# Patient Record
Sex: Female | Born: 1945 | Race: White | Hispanic: No | Smoking: Former smoker
Health system: Southern US, Community
[De-identification: ages and names within clinical notes are randomized; demographics above are authoritative.]

## PROBLEM LIST (undated history)

## (undated) DIAGNOSIS — E785 Hyperlipidemia, unspecified: Secondary | ICD-10-CM

## (undated) DIAGNOSIS — F32A Depression, unspecified: Secondary | ICD-10-CM

## (undated) DIAGNOSIS — M8580 Other specified disorders of bone density and structure, unspecified site: Secondary | ICD-10-CM

## (undated) DIAGNOSIS — F329 Major depressive disorder, single episode, unspecified: Secondary | ICD-10-CM

## (undated) DIAGNOSIS — K219 Gastro-esophageal reflux disease without esophagitis: Secondary | ICD-10-CM

## (undated) DIAGNOSIS — I10 Essential (primary) hypertension: Secondary | ICD-10-CM

## (undated) DIAGNOSIS — F419 Anxiety disorder, unspecified: Secondary | ICD-10-CM

## (undated) DIAGNOSIS — H269 Unspecified cataract: Secondary | ICD-10-CM

## (undated) DIAGNOSIS — F1910 Other psychoactive substance abuse, uncomplicated: Secondary | ICD-10-CM

## (undated) HISTORY — PX: TUBAL LIGATION: SHX77

## (undated) HISTORY — PX: EYE SURGERY: SHX253

## (undated) HISTORY — PX: NASAL SEPTUM SURGERY: SHX37

## (undated) HISTORY — DX: Depression, unspecified: F32.A

## (undated) HISTORY — DX: Other psychoactive substance abuse, uncomplicated: F19.10

## (undated) HISTORY — DX: Hyperlipidemia, unspecified: E78.5

## (undated) HISTORY — DX: Gastro-esophageal reflux disease without esophagitis: K21.9

## (undated) HISTORY — DX: Anxiety disorder, unspecified: F41.9

## (undated) HISTORY — DX: Essential (primary) hypertension: I10

## (undated) HISTORY — DX: Other specified disorders of bone density and structure, unspecified site: M85.80

## (undated) HISTORY — DX: Major depressive disorder, single episode, unspecified: F32.9

## (undated) HISTORY — DX: Unspecified cataract: H26.9

---

## 2011-11-01 DIAGNOSIS — N3946 Mixed incontinence: Secondary | ICD-10-CM | POA: Insufficient documentation

## 2011-11-01 DIAGNOSIS — M1990 Unspecified osteoarthritis, unspecified site: Secondary | ICD-10-CM | POA: Insufficient documentation

## 2011-11-01 DIAGNOSIS — M810 Age-related osteoporosis without current pathological fracture: Secondary | ICD-10-CM | POA: Insufficient documentation

## 2011-11-01 DIAGNOSIS — E785 Hyperlipidemia, unspecified: Secondary | ICD-10-CM | POA: Insufficient documentation

## 2011-11-01 DIAGNOSIS — I10 Essential (primary) hypertension: Secondary | ICD-10-CM | POA: Insufficient documentation

## 2011-11-01 DIAGNOSIS — E559 Vitamin D deficiency, unspecified: Secondary | ICD-10-CM | POA: Insufficient documentation

## 2011-11-01 DIAGNOSIS — J309 Allergic rhinitis, unspecified: Secondary | ICD-10-CM | POA: Insufficient documentation

## 2011-11-01 DIAGNOSIS — H3530 Unspecified macular degeneration: Secondary | ICD-10-CM | POA: Insufficient documentation

## 2011-11-08 DIAGNOSIS — F419 Anxiety disorder, unspecified: Secondary | ICD-10-CM | POA: Insufficient documentation

## 2013-07-25 LAB — HM COLONOSCOPY

## 2014-01-31 DIAGNOSIS — E042 Nontoxic multinodular goiter: Secondary | ICD-10-CM | POA: Insufficient documentation

## 2014-05-21 DIAGNOSIS — Z0000 Encounter for general adult medical examination without abnormal findings: Secondary | ICD-10-CM | POA: Insufficient documentation

## 2014-12-17 ENCOUNTER — Telehealth: Payer: Self-pay | Admitting: Family Medicine

## 2014-12-17 NOTE — Telephone Encounter (Signed)
Patient has [employer] and medicare. Patient is on simvastatin 40mg , equate vision formula, stool softner, vit d 3, asa, hctz 25mg , lexapro 10, biotin, fish oil, omeprazole 20mg , ambien 10mg . Appointment given for 8/10 @ 1:10pm with Stacks.

## 2014-12-26 DIAGNOSIS — G4700 Insomnia, unspecified: Secondary | ICD-10-CM | POA: Insufficient documentation

## 2015-01-14 ENCOUNTER — Encounter (INDEPENDENT_AMBULATORY_CARE_PROVIDER_SITE_OTHER): Payer: Self-pay

## 2015-01-14 ENCOUNTER — Other Ambulatory Visit: Payer: Self-pay | Admitting: *Deleted

## 2015-01-14 ENCOUNTER — Ambulatory Visit (INDEPENDENT_AMBULATORY_CARE_PROVIDER_SITE_OTHER): Payer: Medicare Other | Admitting: Family Medicine

## 2015-01-14 ENCOUNTER — Encounter: Payer: Self-pay | Admitting: Family Medicine

## 2015-01-14 VITALS — BP 126/70 | HR 56 | Temp 97.3°F | Ht 59.0 in | Wt 105.8 lb

## 2015-01-14 DIAGNOSIS — E559 Vitamin D deficiency, unspecified: Secondary | ICD-10-CM

## 2015-01-14 DIAGNOSIS — E042 Nontoxic multinodular goiter: Secondary | ICD-10-CM | POA: Diagnosis not present

## 2015-01-14 DIAGNOSIS — G4700 Insomnia, unspecified: Secondary | ICD-10-CM | POA: Diagnosis not present

## 2015-01-14 DIAGNOSIS — K2100 Gastro-esophageal reflux disease with esophagitis, without bleeding: Secondary | ICD-10-CM

## 2015-01-14 DIAGNOSIS — I10 Essential (primary) hypertension: Secondary | ICD-10-CM | POA: Diagnosis not present

## 2015-01-14 DIAGNOSIS — M8580 Other specified disorders of bone density and structure, unspecified site: Secondary | ICD-10-CM

## 2015-01-14 DIAGNOSIS — F419 Anxiety disorder, unspecified: Secondary | ICD-10-CM | POA: Diagnosis not present

## 2015-01-14 DIAGNOSIS — M1990 Unspecified osteoarthritis, unspecified site: Secondary | ICD-10-CM | POA: Diagnosis not present

## 2015-01-14 DIAGNOSIS — E785 Hyperlipidemia, unspecified: Secondary | ICD-10-CM

## 2015-01-14 MED ORDER — ZOLPIDEM TARTRATE 10 MG PO TABS: 10.0000 mg | ORAL_TABLET | Freq: Every evening | ORAL | Status: DC | PRN

## 2015-01-14 MED ORDER — DENOSUMAB 60 MG/ML ~~LOC~~ SOLN: 60.0000 mg | Freq: Once | SUBCUTANEOUS | Status: DC

## 2015-01-14 MED ORDER — ESCITALOPRAM OXALATE 10 MG PO TABS: 10.0000 mg | ORAL_TABLET | Freq: Every day | ORAL | Status: DC

## 2015-01-14 MED ORDER — OMEPRAZOLE 20 MG PO CPDR: 20.0000 mg | DELAYED_RELEASE_CAPSULE | Freq: Every day | ORAL | Status: DC

## 2015-01-14 NOTE — Progress Notes (Signed)
Subjective:  Patient ID: Karina Clark, female    DOB: [birth year]  Age: 69 y.o. MRN: [id number]  CC: Establish Care   HPI Isaac Lacson presents for Patient in for follow-up of GERD. Currently asymptomatic taking  PPI daily. There is no chest pain or heartburn. No hematemesis and no melena. No dysphagia or choking. Onset is remote. Progression is stable. Complicating factors, none. Patient relates that she had to have her esophagus dilated in the past by Dr. Arlana Pouch in [place]. She does well as long as she takes her omeprazole. Patient also takes Ambien for insomnia. She sleeps well as long as she takes the Ambien she estimates that she's been using that for 3 years on a regular basis  Patient in for follow-up of elevated cholesterol. Doing well without complaints on current medication. Denies side effects of statin including myalgia and arthralgia and nausea. Also in today for liver function testing. Currently no chest pain, shortness of breath or other cardiovascular related symptoms noted. She has been followed for anxiety. She just tends to worry a lot and that seems to paralyze her. She as an ominous feeling that bad things are about to happen at any given time.this has been relieved well for her using  escitalopram in the past she would like to have that renewed. She denies any side effect.  She has been treated or osteoporosis/osteopenia in the past and would like to resume treatment. She does not tolerate oral medicines for this due to side effects. She is interested in injection.o recent fracture history. No spinal or hip fractures.  follow-up of hypertension. Patient has no history of headache chest pain or shortness of breath or recent cough. Patient also denies symptoms of TIA such as numbness weakness lateralizing. Patient checks  blood pressure at home and has not had any elevated readings recently. Patient denies side effects from his medication. States taking it  regularly.   History Jakya has a past medical history of Hyperlipidemia; Hypertension; and Anxiety.   She has past surgical history that includes Tubal ligation.   Her family history includes Hyperlipidemia in her mother.She reports that she quit smoking about 19 years ago. Her smoking use included Cigarettes. She started smoking about 45 years ago. She smoked 0.50 packs per day. She does not have any smokeless tobacco history on file. She reports that she does not drink alcohol or use illicit drugs.  Med list attached, gleaned from care everywhere, reviewed with pt and added to chart.Labs from Care everywhere reviewed as well.  ROS Review of Systems  Constitutional: Negative for fever, chills, diaphoresis, appetite change, fatigue and unexpected weight change.  HENT: Negative for congestion, ear pain, hearing loss, postnasal drip, rhinorrhea, sneezing, sore throat and trouble swallowing.   Eyes: Negative for pain.  Respiratory: Negative for cough, chest tightness and shortness of breath.   Cardiovascular: Negative for chest pain and palpitations.  Gastrointestinal: Negative for nausea, vomiting, abdominal pain, diarrhea and constipation.  Genitourinary: Negative for dysuria, frequency and menstrual problem.  Musculoskeletal: Negative for joint swelling and arthralgias.  Skin: Negative for rash.  Neurological: Negative for dizziness, weakness, numbness and headaches.  Psychiatric/Behavioral: Negative for dysphoric mood and agitation.    Objective:  BP 126/70 mmHg  Pulse 56  Temp(Src) 97.3 F (36.3 C) (Oral)  Ht 4\' 11"  (1.499 m)  Wt 105 lb 12.8 oz (47.991 kg)  BMI 21.36 kg/m2  BP Readings from Last 3 Encounters:  01/14/15 126/70    Wt Readings from  Last 3 Encounters:  01/14/15 105 lb 12.8 oz (47.991 kg)     Physical Exam  Constitutional: She is oriented to person, place, and time. She appears well-developed and well-nourished. No distress.  HENT:  Head: Normocephalic and  atraumatic.  Right Ear: External ear normal.  Left Ear: External ear normal.  Nose: Nose normal.  Mouth/Throat: Oropharynx is clear and moist.  Eyes: Conjunctivae and EOM are normal. Pupils are equal, round, and reactive to light.  Neck: Normal range of motion. Neck supple. No thyromegaly present.  Cardiovascular: Normal rate, regular rhythm and normal heart sounds.   No murmur heard. Pulmonary/Chest: Effort normal and breath sounds normal. No respiratory distress. She has no wheezes. She has no rales.  Abdominal: Soft. Bowel sounds are normal. She exhibits no distension. There is no tenderness.  Lymphadenopathy:    She has no cervical adenopathy.  Neurological: She is alert and oriented to person, place, and time. She has normal reflexes.  Skin: Skin is warm and dry.  Psychiatric: She has a normal mood and affect. Her behavior is normal. Judgment and thought content normal.    No results found for: HGBA1C  No results found for: WBC, HGB, HCT, PLT, GLUCOSE, CHOL, TRIG, HDL, LDLDIRECT, LDLCALC, ALT, AST, NA, K, CL, CREATININE, BUN, CO2, TSH, PSA, INR, GLUF, HGBA1C, MICROALBUR  Patient was never admitted.  Assessment & Plan:   Tequlia was seen today for establish care.  Diagnoses and all orders for this visit:  Gastroesophageal reflux disease with esophagitis  Anxiety  HLD (hyperlipidemia)  Essential hypertension  Cannot sleep  Multinodular goiter  Osteoarthritis, unspecified osteoarthritis type, unspecified site  Osteopenia  Avitaminosis D  Other orders -     omeprazole (PRILOSEC) 20 MG capsule; Take 1 capsule (20 mg total) by mouth daily. -     zolpidem (AMBIEN) 10 MG tablet; Take 1 tablet (10 mg total) by mouth at bedtime as needed. -     denosumab (PROLIA) 60 MG/ML SOLN injection; Inject 60 mg into the skin once. Administer in upper arm, thigh, or abdomen  I have changed Ms. Laramee's omeprazole and zolpidem. I am also having her start on denosumab. Additionally, I  am having her maintain her aspirin EC, Biotin, hydrochlorothiazide, Vitamin D (Cholecalciferol), docusate sodium, Omega-3, and simvastatin.  Meds ordered this encounter  Medications  . aspirin EC 81 MG tablet    Sig: Take 81 mg by mouth daily.  . Biotin 1 MG CAPS    Sig: Take 1 tablet by mouth daily.  Marland Kitchen DISCONTD: zolpidem (AMBIEN) 10 MG tablet    Sig: Take 10 mg by mouth at bedtime as needed.  . hydrochlorothiazide (HYDRODIURIL) 25 MG tablet    Sig: Take 25 mg by mouth daily.   . Vitamin D, Cholecalciferol, 1000 UNITS CAPS    Sig: Take 1,000 Units by mouth daily.  Marland Kitchen docusate sodium (COLACE) 100 MG capsule    Sig: Take 100 mg by mouth.  . Omega-3 1000 MG CAPS    Sig: Take 1 g by mouth daily.  . simvastatin (ZOCOR) 40 MG tablet    Sig: Take 40 mg by mouth daily.   Marland Kitchen DISCONTD: omeprazole (PRILOSEC) 20 MG capsule    Sig: TAKE ONE CAPSULE DAILY.  Marland Kitchen DISCONTD: escitalopram (LEXAPRO) 10 MG tablet    Sig: Take 10 mg by mouth daily.   Marland Kitchen omeprazole (PRILOSEC) 20 MG capsule    Sig: Take 1 capsule (20 mg total) by mouth daily.    Dispense:  90  capsule    Refill:  3  . zolpidem (AMBIEN) 10 MG tablet    Sig: Take 1 tablet (10 mg total) by mouth at bedtime as needed.    Dispense:  90 tablet    Refill:  1  . denosumab (PROLIA) 60 MG/ML SOLN injection    Sig: Inject 60 mg into the skin once. Administer in upper arm, thigh, or abdomen    Dispense:  1 mL    Refill:  1     Follow-up: Return in about 3 months (around 04/16/2015).  Claretta Fraise, M.D.

## 2015-01-14 NOTE — Patient Instructions (Signed)
Cut back on the Lexapro as follows. Over the next 6 weeks please take just one half of a tablet daily at your usual time. Monitor yourself for signs of returning or worsening anxiety. This would include worry that is difficult for you to control. Feeling tremorous or physical symptoms such as chest pain or shortness of breath without another physical cause. If these do not occur discontinue the Lexapro after 6 weeks. Monitor for the same anxiety symptoms during the following 6 weeks. At the end of that time he will be due your next checkup and weaned can discuss the procedure at that time whether to continue off of the medicine or to resume it at either the lower or full dose

## 2015-01-19 ENCOUNTER — Ambulatory Visit: Payer: Self-pay

## 2015-02-02 DIAGNOSIS — Z1231 Encounter for screening mammogram for malignant neoplasm of breast: Secondary | ICD-10-CM | POA: Diagnosis not present

## 2015-02-16 ENCOUNTER — Encounter: Payer: Self-pay | Admitting: Family Medicine

## 2015-02-19 DIAGNOSIS — M5033 Other cervical disc degeneration, cervicothoracic region: Secondary | ICD-10-CM | POA: Diagnosis not present

## 2015-02-19 DIAGNOSIS — M9901 Segmental and somatic dysfunction of cervical region: Secondary | ICD-10-CM | POA: Diagnosis not present

## 2015-02-24 DIAGNOSIS — M5033 Other cervical disc degeneration, cervicothoracic region: Secondary | ICD-10-CM | POA: Diagnosis not present

## 2015-02-24 DIAGNOSIS — M9901 Segmental and somatic dysfunction of cervical region: Secondary | ICD-10-CM | POA: Diagnosis not present

## 2015-02-25 DIAGNOSIS — M9901 Segmental and somatic dysfunction of cervical region: Secondary | ICD-10-CM | POA: Diagnosis not present

## 2015-02-25 DIAGNOSIS — M5033 Other cervical disc degeneration, cervicothoracic region: Secondary | ICD-10-CM | POA: Diagnosis not present

## 2015-02-26 DIAGNOSIS — M5033 Other cervical disc degeneration, cervicothoracic region: Secondary | ICD-10-CM | POA: Diagnosis not present

## 2015-02-26 DIAGNOSIS — M9901 Segmental and somatic dysfunction of cervical region: Secondary | ICD-10-CM | POA: Diagnosis not present

## 2015-03-02 DIAGNOSIS — M5033 Other cervical disc degeneration, cervicothoracic region: Secondary | ICD-10-CM | POA: Diagnosis not present

## 2015-03-02 DIAGNOSIS — M9901 Segmental and somatic dysfunction of cervical region: Secondary | ICD-10-CM | POA: Diagnosis not present

## 2015-03-04 DIAGNOSIS — M9901 Segmental and somatic dysfunction of cervical region: Secondary | ICD-10-CM | POA: Diagnosis not present

## 2015-03-04 DIAGNOSIS — M5033 Other cervical disc degeneration, cervicothoracic region: Secondary | ICD-10-CM | POA: Diagnosis not present

## 2015-03-05 ENCOUNTER — Ambulatory Visit (INDEPENDENT_AMBULATORY_CARE_PROVIDER_SITE_OTHER): Payer: Medicare Other

## 2015-03-05 DIAGNOSIS — Z23 Encounter for immunization: Secondary | ICD-10-CM

## 2015-03-05 DIAGNOSIS — M9901 Segmental and somatic dysfunction of cervical region: Secondary | ICD-10-CM | POA: Diagnosis not present

## 2015-03-05 DIAGNOSIS — M5033 Other cervical disc degeneration, cervicothoracic region: Secondary | ICD-10-CM | POA: Diagnosis not present

## 2015-03-09 DIAGNOSIS — M5033 Other cervical disc degeneration, cervicothoracic region: Secondary | ICD-10-CM | POA: Diagnosis not present

## 2015-03-09 DIAGNOSIS — M9901 Segmental and somatic dysfunction of cervical region: Secondary | ICD-10-CM | POA: Diagnosis not present

## 2015-03-11 DIAGNOSIS — M5033 Other cervical disc degeneration, cervicothoracic region: Secondary | ICD-10-CM | POA: Diagnosis not present

## 2015-03-11 DIAGNOSIS — M9901 Segmental and somatic dysfunction of cervical region: Secondary | ICD-10-CM | POA: Diagnosis not present

## 2015-03-12 DIAGNOSIS — M5033 Other cervical disc degeneration, cervicothoracic region: Secondary | ICD-10-CM | POA: Diagnosis not present

## 2015-03-12 DIAGNOSIS — M9901 Segmental and somatic dysfunction of cervical region: Secondary | ICD-10-CM | POA: Diagnosis not present

## 2015-03-16 DIAGNOSIS — H2513 Age-related nuclear cataract, bilateral: Secondary | ICD-10-CM | POA: Diagnosis not present

## 2015-03-16 DIAGNOSIS — H40033 Anatomical narrow angle, bilateral: Secondary | ICD-10-CM | POA: Diagnosis not present

## 2015-03-18 ENCOUNTER — Encounter: Payer: Self-pay | Admitting: Family Medicine

## 2015-03-18 ENCOUNTER — Ambulatory Visit (INDEPENDENT_AMBULATORY_CARE_PROVIDER_SITE_OTHER): Payer: Medicare Other | Admitting: Family Medicine

## 2015-03-18 VITALS — BP 150/86 | HR 67 | Temp 97.1°F | Ht 59.0 in | Wt 105.2 lb

## 2015-03-18 DIAGNOSIS — F329 Major depressive disorder, single episode, unspecified: Secondary | ICD-10-CM | POA: Diagnosis not present

## 2015-03-18 DIAGNOSIS — F32A Depression, unspecified: Secondary | ICD-10-CM

## 2015-03-18 DIAGNOSIS — I10 Essential (primary) hypertension: Secondary | ICD-10-CM

## 2015-03-18 DIAGNOSIS — F419 Anxiety disorder, unspecified: Secondary | ICD-10-CM

## 2015-03-18 DIAGNOSIS — R110 Nausea: Secondary | ICD-10-CM | POA: Diagnosis not present

## 2015-03-18 LAB — POCT URINALYSIS DIPSTICK
Glucose, UA: NEGATIVE
Ketones, UA: NEGATIVE
Leukocytes, UA: NEGATIVE
NITRITE UA: NEGATIVE
Protein, UA: NEGATIVE
Spec Grav, UA: 1.02
UROBILINOGEN UA: NEGATIVE
pH, UA: 6

## 2015-03-18 LAB — POCT UA - MICROSCOPIC ONLY
BACTERIA, U MICROSCOPIC: NEGATIVE
CASTS, UR, LPF, POC: NEGATIVE
CRYSTALS, UR, HPF, POC: NEGATIVE
MUCUS UA: NEGATIVE
WBC, Ur, HPF, POC: NEGATIVE
Yeast, UA: NEGATIVE

## 2015-03-18 MED ORDER — ESCITALOPRAM OXALATE 5 MG PO TABS: 5.0000 mg | ORAL_TABLET | Freq: Every day | ORAL | Status: DC

## 2015-03-18 NOTE — Progress Notes (Signed)
Subjective:  Patient ID: Karina Clark, female    DOB: 09-20-1945  Age: 69 y.o. MRN: [id number]  CC: anxiety   HPI Karina Clark presents for anxiety and depression. Tapered off lexapro as we discussed. Felt good off med for first week. Now achy in the joints. Feverish. Had flu shot on the 29th and sx started the next day. Felt like she was coming out of her skin yesterday. Weepy yesterday. My past eats me up sometimes. Bad marriages.  Had a suicide attempt many years ago when first husband left. 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 History Karina Clark has a past medical history of Hyperlipidemia; Hypertension; Anxiety; Substance abuse; and Cataract.   She has past surgical history that includes Tubal ligation.   Her family history includes Hyperlipidemia in her mother.She reports that she quit smoking about 20 years ago. Her smoking use included Cigarettes. She started smoking about 46 years ago. She smoked 0.50 packs per day. She does not have any smokeless tobacco history on file. She reports that she does not drink alcohol or use illicit drugs.  Outpatient Prescriptions Prior to Visit  Medication Sig Dispense Refill  . aspirin EC 81 MG tablet Take 81 mg by mouth daily.    . Biotin 1 MG CAPS Take 1 tablet by mouth daily.    Marland Kitchen denosumab (PROLIA) 60 MG/ML SOLN injection Inject 60 mg into the skin once. Administer in upper arm, thigh, or abdomen 1 mL 1  . docusate sodium (COLACE) 100 MG capsule Take 100 mg by mouth.    . hydrochlorothiazide (HYDRODIURIL) 25 MG tablet Take 25 mg by mouth daily.     . Omega-3 1000 MG CAPS Take 1 g by mouth daily.    Marland Kitchen omeprazole (PRILOSEC) 20 MG capsule Take 1 capsule (20 mg total) by mouth daily. 90 capsule 3  . simvastatin (ZOCOR) 40 MG tablet Take 40 mg by mouth daily.     . Vitamin D, Cholecalciferol, 1000 UNITS CAPS Take 1,000 Units by mouth daily.    Marland Kitchen zolpidem (AMBIEN) 10 MG tablet Take 1 tablet (10 mg total) by mouth at  bedtime as needed. 90 tablet 1  . escitalopram (LEXAPRO) 10 MG tablet Take 1 tablet (10 mg total) by mouth daily. (Patient not taking: Reported on 03/18/2015) 90 tablet 0   No facility-administered medications prior to visit.    ROS Review of Systems  Constitutional: Negative for fever, activity change and appetite change.  HENT: Negative for congestion, rhinorrhea and sore throat.   Eyes: Negative for pain and visual disturbance.  Respiratory: Negative for cough and shortness of breath.   Gastrointestinal: Negative for nausea and abdominal pain.  Musculoskeletal: Negative for myalgias and arthralgias.  Psychiatric/Behavioral: Positive for behavioral problems, dysphoric mood and agitation. The patient is nervous/anxious.     Objective:  BP 150/86 mmHg  Pulse 67  Temp(Src) 97.1 F (36.2 C) (Oral)  Ht 4\' 11"  (1.499 m)  Wt 105 lb 3.2 oz (47.718 kg)  BMI 21.24 kg/m2  BP Readings from Last 3 Encounters:  03/18/15 150/86  01/14/15 126/70    Wt Readings from Last 3 Encounters:  03/18/15 105 lb 3.2 oz (47.718 kg)  01/14/15 105 lb 12.8 oz (47.991 kg)     Physical Exam  Constitutional: She is oriented to person, place, and time. She appears well-developed and well-nourished. She appears distressed.  HENT:  Head: Normocephalic and atraumatic.  Eyes: Conjunctivae are normal. Pupils are equal, round,  and reactive to light.  Neck: Normal range of motion. Neck supple. No thyromegaly present.  Cardiovascular: Normal rate, regular rhythm and normal heart sounds.   No murmur heard. Pulmonary/Chest: Effort normal and breath sounds normal. No respiratory distress. She has no wheezes. She has no rales.  Abdominal: Soft. Bowel sounds are normal. She exhibits no distension. There is no tenderness.  Musculoskeletal: Normal range of motion.  Lymphadenopathy:    She has no cervical adenopathy.  Neurological: She is alert and oriented to person, place, and time.  Skin: Skin is warm and dry.    Psychiatric: Her behavior is normal. Judgment and thought content normal.  Upset, agitated. Tearful    No results found for: HGBA1C  No results found for: WBC, HGB, HCT, PLT, GLUCOSE, CHOL, TRIG, HDL, LDLDIRECT, LDLCALC, ALT, AST, NA, K, CL, CREATININE, BUN, CO2, TSH, PSA, INR, GLUF, HGBA1C, MICROALBUR  Patient was never admitted.  Assessment & Plan:   Karina Clark was seen today for anxiety.  Diagnoses and all orders for this visit:  Nausea without vomiting -     POCT UA - Microscopic Only -     POCT urinalysis dipstick  Essential hypertension  Anxiety  Depression  Other orders -     escitalopram (LEXAPRO) 5 MG tablet; Take 1 tablet (5 mg total) by mouth daily.   I have changed Karina Clark's escitalopram. I am also having her maintain her aspirin EC, Biotin, hydrochlorothiazide, Vitamin D (Cholecalciferol), docusate sodium, Omega-3, simvastatin, omeprazole, zolpidem, and denosumab.  Meds ordered this encounter  Medications  . escitalopram (LEXAPRO) 5 MG tablet    Sig: Take 1 tablet (5 mg total) by mouth daily.    Dispense:  30 tablet    Refill:  5     Follow-up: Return in about 1 month (around 04/18/2015).  Claretta Fraise, M.D.

## 2015-03-19 ENCOUNTER — Encounter: Payer: Self-pay | Admitting: Family Medicine

## 2015-03-27 ENCOUNTER — Telehealth: Payer: Self-pay | Admitting: Family Medicine

## 2015-03-27 NOTE — Telephone Encounter (Signed)
Patient states that she is still not feeling good and she wants to see about going back on the 10mg  of lexapro. She states her anxiety and depression is not improving.

## 2015-03-27 NOTE — Telephone Encounter (Signed)
That is fine. As long as she is not having any side effects.

## 2015-03-27 NOTE — Telephone Encounter (Signed)
Stp and she has an appt with Dr.Stacks 11/11 and will talk with him at that time.

## 2015-04-03 ENCOUNTER — Other Ambulatory Visit: Payer: Self-pay | Admitting: Pharmacist

## 2015-04-03 ENCOUNTER — Telehealth: Payer: Self-pay | Admitting: Pharmacist

## 2015-04-03 DIAGNOSIS — M8580 Other specified disorders of bone density and structure, unspecified site: Secondary | ICD-10-CM

## 2015-04-03 NOTE — Telephone Encounter (Signed)
I spoke to patient about Prolia coverage.  Verified through [employer] that Prolia would be covered for diagnosis M85.80.   Patient is due to have DEXA at end of November. We decided to hold off on Prolia until after DEXA done.  Order sent for DEXA

## 2015-04-13 DIAGNOSIS — L570 Actinic keratosis: Secondary | ICD-10-CM | POA: Diagnosis not present

## 2015-04-13 DIAGNOSIS — D485 Neoplasm of uncertain behavior of skin: Secondary | ICD-10-CM | POA: Diagnosis not present

## 2015-04-13 DIAGNOSIS — L851 Acquired keratosis [keratoderma] palmaris et plantaris: Secondary | ICD-10-CM | POA: Diagnosis not present

## 2015-04-13 DIAGNOSIS — L280 Lichen simplex chronicus: Secondary | ICD-10-CM | POA: Diagnosis not present

## 2015-04-17 ENCOUNTER — Encounter: Payer: Self-pay | Admitting: Family Medicine

## 2015-04-17 ENCOUNTER — Ambulatory Visit (INDEPENDENT_AMBULATORY_CARE_PROVIDER_SITE_OTHER): Payer: Medicare Other | Admitting: Family Medicine

## 2015-04-17 VITALS — BP 115/63 | HR 70 | Temp 98.1°F | Ht 59.0 in | Wt 105.2 lb

## 2015-04-17 DIAGNOSIS — F419 Anxiety disorder, unspecified: Secondary | ICD-10-CM

## 2015-04-17 DIAGNOSIS — E785 Hyperlipidemia, unspecified: Secondary | ICD-10-CM | POA: Diagnosis not present

## 2015-04-17 DIAGNOSIS — K2100 Gastro-esophageal reflux disease with esophagitis, without bleeding: Secondary | ICD-10-CM

## 2015-04-17 DIAGNOSIS — M1990 Unspecified osteoarthritis, unspecified site: Secondary | ICD-10-CM

## 2015-04-17 DIAGNOSIS — I10 Essential (primary) hypertension: Secondary | ICD-10-CM

## 2015-04-17 MED ORDER — ESCITALOPRAM OXALATE 10 MG PO TABS: 10.0000 mg | ORAL_TABLET | Freq: Every day | ORAL | Status: DC

## 2015-04-17 MED ORDER — MIRABEGRON ER 25 MG PO TB24: 25.0000 mg | ORAL_TABLET | Freq: Every day | ORAL | Status: DC

## 2015-04-17 MED ORDER — SIMVASTATIN 40 MG PO TABS: 40.0000 mg | ORAL_TABLET | Freq: Every day | ORAL | Status: DC

## 2015-04-17 MED ORDER — MIRABEGRON ER 25 MG PO TB24
25.0000 mg | ORAL_TABLET | Freq: Every day | ORAL | Status: DC
Start: 2015-04-17 — End: 2015-04-17

## 2015-04-17 NOTE — Progress Notes (Signed)
Subjective:  Patient ID: Karina Clark, female    DOB: 1945-10-15  Age: 69 y.o. MRN: [id number]  CC: Hypertension; Over Active Bladder; Gastroesophageal Reflux; and Depression   HPI Karina Clark presents for  follow-up of hypertension. Patient has no history of headache chest pain or shortness of breath or recent cough. Patient also denies symptoms of TIA such as numbness weakness lateralizing. Patient checks  blood pressure at home and has not had any elevated readings recently. Patient denies side effects from his medication. States taking it regularly.  Patient in for follow-up of GERD. Currently asymptomatic taking  PPI daily. There is no chest pain or heartburn. No hematemesis and no melena. No dysphagia or choking. Onset is remote. Progression is stable. Complicating factors, none.  Patient states that she had to go back up on the escitalopram to the 10 mg. She is taking 2 a day of the 5 that were prescribed. She had attempted to wean down but that led to sadness and dysphoria as well as trouble concentrating and sleeping. She is doing much better now that she is back on the 10 mg.  Patient states that she has been overactive bladder. She notes that she is having to go to the bathroom too frequently. She is not losing urine with laughing and crying bending coughing etc. However she goes frequently and passes just a little bit of urine each time. History Karina Clark has a past medical history of Hyperlipidemia; Hypertension; Anxiety; Substance abuse; and Cataract.   She has past surgical history that includes Tubal ligation.   Her family history includes Hyperlipidemia in her mother.She reports that she quit smoking about 20 years ago. Her smoking use included Cigarettes. She started smoking about 46 years ago. She smoked 0.50 packs per day. She does not have any smokeless tobacco history on file. She reports that she does not drink alcohol or use illicit drugs.  Outpatient Prescriptions  Prior to Visit  Medication Sig Dispense Refill  . aspirin EC 81 MG tablet Take 81 mg by mouth daily.    . Biotin 1 MG CAPS Take 1 tablet by mouth daily.    Marland Kitchen denosumab (PROLIA) 60 MG/ML SOLN injection Inject 60 mg into the skin once. Administer in upper arm, thigh, or abdomen 1 mL 1  . docusate sodium (COLACE) 100 MG capsule Take 100 mg by mouth.    . hydrochlorothiazide (HYDRODIURIL) 25 MG tablet Take 12.5 mg by mouth daily. 1/2 tab daily    . Omega-3 1000 MG CAPS Take 1 g by mouth daily.    Marland Kitchen omeprazole (PRILOSEC) 20 MG capsule Take 1 capsule (20 mg total) by mouth daily. 90 capsule 3  . Vitamin D, Cholecalciferol, 1000 UNITS CAPS Take 1,000 Units by mouth daily.    Marland Kitchen zolpidem (AMBIEN) 10 MG tablet Take 1 tablet (10 mg total) by mouth at bedtime as needed. 90 tablet 1  . escitalopram (LEXAPRO) 5 MG tablet Take 1 tablet (5 mg total) by mouth daily. 30 tablet 5  . simvastatin (ZOCOR) 40 MG tablet Take 40 mg by mouth daily.      No facility-administered medications prior to visit.    ROS Review of Systems  Constitutional: Negative for fever, chills, diaphoresis, appetite change, fatigue and unexpected weight change.  HENT: Negative for congestion, ear pain, hearing loss, postnasal drip, rhinorrhea, sneezing, sore throat and trouble swallowing.   Eyes: Negative for pain.  Respiratory: Negative for cough, chest tightness and shortness of breath.   Cardiovascular:  Negative for chest pain and palpitations.  Gastrointestinal: Negative for nausea, vomiting, abdominal pain, diarrhea and constipation.  Genitourinary: Positive for urgency and frequency. Negative for dysuria, vaginal discharge, vaginal pain and menstrual problem.  Musculoskeletal: Negative for joint swelling and arthralgias.  Skin: Negative for rash.  Neurological: Negative for dizziness, weakness, numbness and headaches.  Psychiatric/Behavioral: Positive for dysphoric mood (now resolved, see HPI). Negative for agitation.     Objective:  BP 115/63 mmHg  Pulse 70  Temp(Src) 98.1 F (36.7 C) (Oral)  Ht 4\' 11"  (1.499 m)  Wt 105 lb 3.2 oz (47.718 kg)  BMI 21.24 kg/m2  SpO2 98%  BP Readings from Last 3 Encounters:  04/17/15 115/63  03/18/15 150/86  01/14/15 126/70    Wt Readings from Last 3 Encounters:  04/17/15 105 lb 3.2 oz (47.718 kg)  03/18/15 105 lb 3.2 oz (47.718 kg)  01/14/15 105 lb 12.8 oz (47.991 kg)     Physical Exam  Constitutional: She is oriented to person, place, and time. She appears well-developed and well-nourished. No distress.  HENT:  Head: Normocephalic and atraumatic.  Right Ear: External ear normal.  Left Ear: External ear normal.  Nose: Nose normal.  Mouth/Throat: Oropharynx is clear and moist.  Eyes: Conjunctivae and EOM are normal. Pupils are equal, round, and reactive to light.  Neck: Normal range of motion. Neck supple. No thyromegaly present.  Cardiovascular: Normal rate, regular rhythm and normal heart sounds.   No murmur heard. Pulmonary/Chest: Effort normal and breath sounds normal. No respiratory distress. She has no wheezes. She has no rales.  Abdominal: Soft. Bowel sounds are normal. She exhibits no distension. There is no tenderness.  Lymphadenopathy:    She has no cervical adenopathy.  Neurological: She is alert and oriented to person, place, and time. She has normal reflexes.  Skin: Skin is warm and dry.  Psychiatric: She has a normal mood and affect. Her behavior is normal. Judgment and thought content normal.    No results found for: HGBA1C  No results found for: WBC, HGB, HCT, PLT, GLUCOSE, CHOL, TRIG, HDL, LDLDIRECT, LDLCALC, ALT, AST, NA, K, CL, CREATININE, BUN, CO2, TSH, PSA, INR, GLUF, HGBA1C, MICROALBUR  Patient was never admitted.  Assessment & Plan:   Karina Clark was seen today for hypertension, over active bladder, gastroesophageal reflux and depression.  Diagnoses and all orders for this visit:  HLD (hyperlipidemia)  Essential  hypertension  Osteoarthritis, unspecified osteoarthritis type, unspecified site  Anxiety  Gastroesophageal reflux disease with esophagitis  Other orders -     simvastatin (ZOCOR) 40 MG tablet; Take 1 tablet (40 mg total) by mouth daily. -     escitalopram (LEXAPRO) 10 MG tablet; Take 1 tablet (10 mg total) by mouth daily. -     Discontinue: mirabegron ER (MYRBETRIQ) 25 MG TB24 tablet; Take 1 tablet (25 mg total) by mouth daily. -     mirabegron ER (MYRBETRIQ) 25 MG TB24 tablet; Take 1 tablet (25 mg total) by mouth daily.   I have discontinued Karina Clark's escitalopram and mirabegron ER. I have also changed her simvastatin and escitalopram. Additionally, I am having her start on mirabegron ER. Lastly, I am having her maintain her aspirin EC, Biotin, hydrochlorothiazide, Vitamin D (Cholecalciferol), docusate sodium, Omega-3, omeprazole, zolpidem, and denosumab.  Meds ordered this encounter  Medications  . DISCONTD: escitalopram (LEXAPRO) 10 MG tablet    Sig:   . simvastatin (ZOCOR) 40 MG tablet    Sig: Take 1 tablet (40 mg total) by mouth daily.  Dispense:  90 tablet    Refill:  4  . escitalopram (LEXAPRO) 10 MG tablet    Sig: Take 1 tablet (10 mg total) by mouth daily.    Dispense:  90 tablet    Refill:  1  . DISCONTD: mirabegron ER (MYRBETRIQ) 25 MG TB24 tablet    Sig: Take 1 tablet (25 mg total) by mouth daily.    Dispense:  30 tablet    Refill:  2  . mirabegron ER (MYRBETRIQ) 25 MG TB24 tablet    Sig: Take 1 tablet (25 mg total) by mouth daily.    Dispense:  30 tablet    Refill:  2   We discussed myrbetriq as a remedy for her overactive bladder. However, insurance formulary information indicates that there is very limited coverage for any [facility] med. She will let me know if she has trouble obtaining the medication.  Follow-up: Return in about 1 month (around 05/17/2015) for CPE.  Claretta Fraise, M.D.

## 2015-04-20 ENCOUNTER — Ambulatory Visit (INDEPENDENT_AMBULATORY_CARE_PROVIDER_SITE_OTHER): Payer: Medicare Other | Admitting: Family Medicine

## 2015-04-20 ENCOUNTER — Ambulatory Visit: Payer: Medicare Other | Admitting: Family Medicine

## 2015-04-20 VITALS — BP 134/63 | HR 64 | Temp 97.5°F | Ht 59.0 in | Wt 105.0 lb

## 2015-04-20 DIAGNOSIS — B349 Viral infection, unspecified: Secondary | ICD-10-CM | POA: Diagnosis not present

## 2015-04-20 DIAGNOSIS — D649 Anemia, unspecified: Secondary | ICD-10-CM

## 2015-04-20 DIAGNOSIS — R52 Pain, unspecified: Secondary | ICD-10-CM | POA: Diagnosis not present

## 2015-04-20 DIAGNOSIS — J029 Acute pharyngitis, unspecified: Secondary | ICD-10-CM | POA: Diagnosis not present

## 2015-04-20 DIAGNOSIS — Z7689 Persons encountering health services in other specified circumstances: Secondary | ICD-10-CM | POA: Diagnosis not present

## 2015-04-20 LAB — POCT RAPID STREP A (OFFICE): RAPID STREP A SCREEN: NEGATIVE

## 2015-04-20 LAB — POCT INFLUENZA A/B
Influenza A, POC: NEGATIVE
Influenza B, POC: NEGATIVE

## 2015-04-20 MED ORDER — CLOTRIMAZOLE 10 MG MT TROC: 10.0000 mg | Freq: Every day | OROMUCOSAL | Status: DC

## 2015-04-20 NOTE — Progress Notes (Signed)
Subjective:  Patient ID: Karina Clark, female    DOB: [birth year]  Age: 69 y.o. MRN: [id number]  CC: Sore Throat and Generalized Body Aches   HPI Karina Clark presents for Patient presents with upper respiratory congestion. Rhinorrhea that is frequently purulent. There is moderate sore throat. Patient reports coughing frequently as well.-colored/purulent sputum noted. There is no fever no chills no sweats. The patient denies being short of breath. Onset was 3-5 days ago. Gradually worsening in spite of home remedies.   History [name] has a past medical history of Hyperlipidemia; Hypertension; Anxiety; Substance abuse; and Cataract.   She has past surgical history that includes Tubal ligation.   Her family history includes Hyperlipidemia in her mother.She reports that she quit smoking about 20 years ago. Her smoking use included Cigarettes. She started smoking about 46 years ago. She smoked 0.50 packs per day. She does not have any smokeless tobacco history on file. She reports that she does not drink alcohol or use illicit drugs.  Outpatient Prescriptions Prior to Visit  Medication Sig Dispense Refill  . aspirin EC 81 MG tablet Take 81 mg by mouth daily.    . Biotin 1 MG CAPS Take 1 tablet by mouth daily.    Marland Kitchen denosumab (PROLIA) 60 MG/ML SOLN injection Inject 60 mg into the skin once. Administer in upper arm, thigh, or abdomen 1 mL 1  . docusate sodium (COLACE) 100 MG capsule Take 100 mg by mouth.    . escitalopram (LEXAPRO) 10 MG tablet Take 1 tablet (10 mg total) by mouth daily. 90 tablet 1  . hydrochlorothiazide (HYDRODIURIL) 25 MG tablet Take 12.5 mg by mouth daily. 1/2 tab daily    . mirabegron ER (MYRBETRIQ) 25 MG TB24 tablet Take 1 tablet (25 mg total) by mouth daily. 30 tablet 2  . Omega-3 1000 MG CAPS Take 1 g by mouth daily.    Marland Kitchen omeprazole (PRILOSEC) 20 MG capsule Take 1 capsule (20 mg total) by mouth daily. 90 capsule 3  . simvastatin (ZOCOR) 40 MG tablet Take 1 tablet  (40 mg total) by mouth daily. 90 tablet 4  . Vitamin D, Cholecalciferol, 1000 UNITS CAPS Take 1,000 Units by mouth daily.    Marland Kitchen zolpidem (AMBIEN) 10 MG tablet Take 1 tablet (10 mg total) by mouth at bedtime as needed. 90 tablet 1   No facility-administered medications prior to visit.    ROS Review of Systems  Constitutional: Negative for fever, chills, activity change and appetite change.  HENT: Positive for congestion, postnasal drip, rhinorrhea and sinus pressure. Negative for ear discharge, ear pain, hearing loss, nosebleeds, sneezing and trouble swallowing.   Respiratory: Negative for chest tightness and shortness of breath.   Cardiovascular: Negative for chest pain and palpitations.  Skin: Negative for rash.    Objective:  BP 134/63 mmHg  Pulse 64  Temp(Src) 97.5 F (36.4 C) (Oral)  Ht '4\' 11"'  (1.499 m)  Wt 105 lb (47.628 kg)  BMI 21.20 kg/m2  SpO2 100%  BP Readings from Last 3 Encounters:  04/20/15 134/63  04/17/15 115/63  03/18/15 150/86    Wt Readings from Last 3 Encounters:  04/20/15 105 lb (47.628 kg)  04/17/15 105 lb 3.2 oz (47.718 kg)  03/18/15 105 lb 3.2 oz (47.718 kg)     Physical Exam  Constitutional: She appears well-developed and well-nourished.  HENT:  Head: Normocephalic and atraumatic.  Right Ear: Tympanic membrane and external ear normal. No decreased hearing is noted.  Left Ear: Tympanic  membrane and external ear normal. No decreased hearing is noted.  Nose: Mucosal edema present. Right sinus exhibits no frontal sinus tenderness. Left sinus exhibits no frontal sinus tenderness.  Mouth/Throat: No oropharyngeal exudate or posterior oropharyngeal erythema.  Neck: No Brudzinski's sign noted.  Pulmonary/Chest: Breath sounds normal. No respiratory distress.  Lymphadenopathy:       Head (right side): No preauricular adenopathy present.       Head (left side): No preauricular adenopathy present.       Right cervical: No superficial cervical adenopathy  present.      Left cervical: No superficial cervical adenopathy present.    No results found for: HGBA1C  Lab Results  Component Value Date   WBC 7.1 04/20/2015   HCT 29.2* 04/20/2015   GLUCOSE 84 04/20/2015   ALT 13 04/20/2015   AST 22 04/20/2015   NA 140 04/20/2015   K 3.8 04/20/2015   CL 99 04/20/2015   CREATININE 0.78 04/20/2015   BUN 13 04/20/2015   CO2 27 04/20/2015    Patient was never admitted.  Assessment & Plan:   [name] was seen today for sore throat and generalized body aches.  Diagnoses and all orders for this visit:  Sore throat -     POCT rapid strep A -     POCT Influenza A/B -     CBC with Differential/Platelet -     CMP14+EGFR  Body aches -     POCT rapid strep A -     POCT Influenza A/B -     CBC with Differential/Platelet -     CMP14+EGFR  Viral syndrome -     CBC with Differential/Platelet -     CMP14+EGFR  Other orders -     Discontinue: clotrimazole (MYCELEX) 10 MG troche; Take 1 tablet (10 mg total) by mouth 5 (five) times daily. -     clotrimazole (MYCELEX) 10 MG troche; Take 1 tablet (10 mg total) by mouth 5 (five) times daily. -     Iron and TIBC -     Ferritin -     Specimen status report   I am having [name] maintain her aspirin EC, Biotin, hydrochlorothiazide, Vitamin D (Cholecalciferol), docusate sodium, Omega-3, omeprazole, zolpidem, denosumab, simvastatin, escitalopram, mirabegron ER, and clotrimazole.  Meds ordered this encounter  Medications  . DISCONTD: clotrimazole (MYCELEX) 10 MG troche    Sig: Take 1 tablet (10 mg total) by mouth 5 (five) times daily.    Dispense:  35 tablet    Refill:  0  . clotrimazole (MYCELEX) 10 MG troche    Sig: Take 1 tablet (10 mg total) by mouth 5 (five) times daily.    Dispense:  35 tablet    Refill:  0     Follow-up: Return if symptoms worsen or fail to improve.  Claretta Fraise, M.D.

## 2015-04-21 ENCOUNTER — Ambulatory Visit: Payer: Medicare Other | Admitting: Family Medicine

## 2015-04-21 LAB — CMP14+EGFR
ALBUMIN: 4.2 g/dL (ref 3.6–4.8)
ALK PHOS: 59 IU/L (ref 39–117)
ALT: 13 IU/L (ref 0–32)
AST: 22 IU/L (ref 0–40)
Albumin/Globulin Ratio: 2 (ref 1.1–2.5)
BUN / CREAT RATIO: 17 (ref 11–26)
BUN: 13 mg/dL (ref 8–27)
Bilirubin Total: <0.2 mg/dL (ref 0.0–1.2)
CALCIUM: 9.1 mg/dL (ref 8.7–10.3)
CHLORIDE: 99 mmol/L (ref 97–106)
CO2: 27 mmol/L (ref 18–29)
CREATININE: 0.78 mg/dL (ref 0.57–1.00)
GFR calc non Af Amer: 78 mL/min/{1.73_m2} (ref >59)
GFR, EST AFRICAN AMERICAN: 90 mL/min/{1.73_m2} (ref >59)
GLOBULIN, TOTAL: 2.1 g/dL (ref 1.5–4.5)
GLUCOSE: 84 mg/dL (ref 65–99)
POTASSIUM: 3.8 mmol/L (ref 3.5–5.2)
Sodium: 140 mmol/L (ref 136–144)
Total Protein: 6.3 g/dL (ref 6.0–8.5)

## 2015-04-21 LAB — CBC WITH DIFFERENTIAL/PLATELET
BASOS ABS: 0 10*3/uL (ref 0.0–0.2)
Basos: 0 %
EOS (ABSOLUTE): 0.1 10*3/uL (ref 0.0–0.4)
EOS: 1 %
HEMATOCRIT: 29.2 % — AB (ref 34.0–46.6)
HEMOGLOBIN: 9.8 g/dL — AB (ref 11.1–15.9)
IMMATURE GRANULOCYTES: 0 %
Immature Grans (Abs): 0 10*3/uL (ref 0.0–0.1)
Lymphocytes Absolute: 2 10*3/uL (ref 0.7–3.1)
Lymphs: 28 %
MCH: 28.7 pg (ref 26.6–33.0)
MCHC: 33.6 g/dL (ref 31.5–35.7)
MCV: 86 fL (ref 79–97)
MONOCYTES: 11 %
Monocytes Absolute: 0.8 10*3/uL (ref 0.1–0.9)
NEUTROS PCT: 60 %
Neutrophils Absolute: 4.2 10*3/uL (ref 1.4–7.0)
Platelets: 250 10*3/uL (ref 150–379)
RBC: 3.41 x10E6/uL — AB (ref 3.77–5.28)
RDW: 14.7 % (ref 12.3–15.4)
WBC: 7.1 10*3/uL (ref 3.4–10.8)

## 2015-04-22 NOTE — Progress Notes (Signed)
Request to add labwork given to lab

## 2015-04-23 ENCOUNTER — Telehealth: Payer: Self-pay | Admitting: Family Medicine

## 2015-04-23 LAB — IRON AND TIBC
IRON SATURATION: 8 % — AB (ref 15–55)
IRON: 29 ug/dL (ref 27–139)
TIBC: 345 ug/dL (ref 250–450)
UIBC: 316 ug/dL (ref 118–369)

## 2015-04-23 LAB — SPECIMEN STATUS REPORT

## 2015-04-23 LAB — FERRITIN: FERRITIN: 10 ng/mL — AB (ref 15–150)

## 2015-04-23 NOTE — Telephone Encounter (Signed)
Stp and advised her cmp wnl but we added a few more tests on to check her iron and are waiting for those. Pt voiced understanding.

## 2015-04-23 NOTE — Telephone Encounter (Signed)
Pt wanted to know what else we were testing for. Advised we were checking for anemia. Pt voiced understanding.

## 2015-04-24 ENCOUNTER — Telehealth: Payer: Self-pay | Admitting: Family Medicine

## 2015-04-27 DIAGNOSIS — H353130 Nonexudative age-related macular degeneration, bilateral, stage unspecified: Secondary | ICD-10-CM | POA: Diagnosis not present

## 2015-04-27 DIAGNOSIS — H2511 Age-related nuclear cataract, right eye: Secondary | ICD-10-CM | POA: Diagnosis not present

## 2015-04-27 DIAGNOSIS — H2512 Age-related nuclear cataract, left eye: Secondary | ICD-10-CM | POA: Diagnosis not present

## 2015-04-27 NOTE — Telephone Encounter (Signed)
Pt aware of lab results 

## 2015-04-29 ENCOUNTER — Telehealth: Payer: Self-pay | Admitting: Family Medicine

## 2015-05-01 NOTE — Telephone Encounter (Signed)
Patient aware that she will probably have to have labs drawn at physical appointment.

## 2015-05-06 ENCOUNTER — Telehealth: Payer: Self-pay | Admitting: Family Medicine

## 2015-05-06 MED ORDER — FLUCONAZOLE 100 MG PO TABS: 100.0000 mg | ORAL_TABLET | Freq: Every day | ORAL | Status: DC

## 2015-05-06 NOTE — Telephone Encounter (Signed)
Fluconazole, po (swallow) scrip sent

## 2015-05-06 NOTE — Telephone Encounter (Signed)
Patient aware and verbalizes understanding. 

## 2015-05-07 ENCOUNTER — Other Ambulatory Visit: Payer: Self-pay | Admitting: Family Medicine

## 2015-05-07 DIAGNOSIS — Z780 Asymptomatic menopausal state: Secondary | ICD-10-CM

## 2015-05-08 ENCOUNTER — Ambulatory Visit (INDEPENDENT_AMBULATORY_CARE_PROVIDER_SITE_OTHER): Payer: Medicare Other

## 2015-05-08 ENCOUNTER — Encounter: Payer: Self-pay | Admitting: Pharmacist

## 2015-05-08 ENCOUNTER — Other Ambulatory Visit: Payer: Medicare Other

## 2015-05-08 ENCOUNTER — Telehealth: Payer: Self-pay | Admitting: Pharmacist

## 2015-05-08 ENCOUNTER — Other Ambulatory Visit: Payer: Self-pay | Admitting: *Deleted

## 2015-05-08 ENCOUNTER — Ambulatory Visit (INDEPENDENT_AMBULATORY_CARE_PROVIDER_SITE_OTHER): Payer: Medicare Other | Admitting: Pharmacist

## 2015-05-08 VITALS — Ht <= 58 in | Wt 105.0 lb

## 2015-05-08 DIAGNOSIS — E785 Hyperlipidemia, unspecified: Secondary | ICD-10-CM | POA: Diagnosis not present

## 2015-05-08 DIAGNOSIS — M8580 Other specified disorders of bone density and structure, unspecified site: Secondary | ICD-10-CM | POA: Diagnosis not present

## 2015-05-08 DIAGNOSIS — Z780 Asymptomatic menopausal state: Secondary | ICD-10-CM

## 2015-05-08 MED ORDER — MIRABEGRON ER 50 MG PO TB24: 50.0000 mg | ORAL_TABLET | Freq: Every day | ORAL | Status: DC

## 2015-05-08 MED ORDER — RALOXIFENE HCL 60 MG PO TABS: 60.0000 mg | ORAL_TABLET | Freq: Every day | ORAL | Status: DC

## 2015-05-08 NOTE — Telephone Encounter (Signed)
Cost of generic evista was $45.  Patient would like to get Prolia instead.   Appt 05/13/2015 withDr Stacks.  Prolia has already been verified by insurance and patient cost would be about $170 or 20% of medication cost.  Medication ordered and will administer at appt 05/13/2015.

## 2015-05-08 NOTE — Progress Notes (Signed)
Patient ID: Karina Clark, female   DOB: [birth year], 69 y.o.   MRN: [id number]  [facility] Current Height: Height: 4\' 10"  (147.3 cm)      Max Lifetime Height:  5\' 0"  Current Weight: Weight: 105 lb (47.628 kg)       Ethnicity:Caucasian   HPI: Does pt already have a diagnosis of:  Osteopenia?  Yes Osteoporosis?  No  Back Pain?  No       Kyphosis?  No Prior fracture?  No Med(s) for Osteoporosis/Osteopenia:  None currently Med(s) previously tried for Osteoporosis/Osteopenia:  Alendronate tried in past but BMD continued to decrease.  Took Reclast for 1 dose about 2-3 years ago.  Patient also mentions when reviewing mediation list that she is thinking of stopping Myrbetriq 25mg  because it doesn't seem to be improving incontinence as much as she thought it would and is $45/month copay.                                                              PMH: Age at menopause:  Last 80's Hysterectomy?  No Oophorectomy?  No HRT? Yes - Former.  Type/duration: 3 years Steroid Use?  No Thyroid med?  No - has history of nodular thyroid - sees endocrinologist. History of cancer?  No History of digestive disorders (ie Crohn's)?  Yes - GERD on chronic PPI therapy Current or previous eating disorders?  No Last Vitamin D Result:  No vitamin D results since patient has been at this office Last GFR Result:  78 (04/20/2015)   FH/SH: Family history of osteoporosis?  No Parent with history of hip fracture?  Yes - mother Family history of breast cancer?  No Exercise?  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  No Alcohol?  No    Calcium Assessment Calcium Intake  # of servings/day  Calcium mg  Milk (8 oz) - almond 1  x  450 = 450mg   Yogurt (4 oz) 1 x  200 = 200mg   Cheese (1 oz) 0 x  200 = 0  Other Calcium sources   250mg   Ca supplement 0 = 0   Estimated calcium intake per day 900mg     DEXA Results Date of Test T-Score for AP  Spine L1-L4 T-Score for Neck of Left Hip T-Score for Total Left Hip T-Score for Total Right Hip  05/08/2015 -2.0 -1.9 -1.7 -1.1        04/29/2013 (from [facility]) -2.1 -1.9    03/14/2011 -1.9 -1.0     FRAX 10 year estimate: Total FX risk:  18%  (consider medication if >/= 20%) Hip FX risk:  3.5%  (consider medication if >/= 3%)  Assessment: Osteopenia with stable BMD but high FRAX / fracture risk Incontinence / medication review  Recommendations: 1.  Start  reloxifine (EVISTA) 60mg  take 1 tablet daily.   Discussed BMD  / DEXA results and discussed fracture risk.  Discussed various treatment options such as Reclast, Evista and Prolia 2.  recommend calcium 1200mg  daily through supplementation or diet.  3.  recommend weight bearing exercise - 30 minutes at least 4 days per week.   4.  Counseled and educated about fall risk and prevention. 5.  Recommended patient try higher dose  of Myrbetriq of 50mg  daily.  I gave her #7 tablets.  She has appointment with Dr Livia Snellen next week and she should be able to tell if higher dose will be more effective.  Recheck DEXA:  2 years  Time spent counseling patient:  30 minutes   Cherre Robins, PharmD, CPP

## 2015-05-08 NOTE — Patient Instructions (Signed)
Fall Prevention in the Home  Falls can cause injuries and can affect people from all age groups. There are many simple things that you can do to make your home safe and to help prevent falls. WHAT CAN I DO ON THE OUTSIDE OF MY HOME?  Regularly repair the edges of walkways and driveways and fix any cracks.  Remove high doorway thresholds.  Trim any shrubbery on the main path into your home.  Use bright outdoor lighting.  Clear walkways of debris and clutter, including tools and rocks.  Regularly check that handrails are securely fastened and in good repair. Both sides of any steps should have handrails.  Install guardrails along the edges of any raised decks or porches.  Have leaves, snow, and ice cleared regularly.  Use sand or salt on walkways during winter months.  In the garage, clean up any spills right away, including grease or oil spills. WHAT CAN I DO IN THE BATHROOM?  Use night lights.  Install grab bars by the toilet and in the tub and shower. Do not use towel bars as grab bars.  Use non-skid mats or decals on the floor of the tub or shower.  If you need to sit down while you are in the shower, use a plastic, non-slip stool..  Keep the floor dry. Immediately clean up any water that spills on the floor.  Remove soap buildup in the tub or shower on a regular basis.  Attach bath mats securely with double-sided non-slip rug tape.  Remove throw rugs and other tripping hazards from the floor. WHAT CAN I DO IN THE BEDROOM?  Use night lights.  Make sure that a bedside light is easy to reach.  Do not use oversized bedding that drapes onto the floor.  Have a firm chair that has side arms to use for getting dressed.  Remove throw rugs and other tripping hazards from the floor. WHAT CAN I DO IN THE KITCHEN?   Clean up any spills right away.  Avoid walking on wet floors.  Place frequently used items in easy-to-reach places.  If you need to reach for something  above you, use a sturdy step stool that has a grab bar.  Keep electrical cables out of the way.  Do not use floor polish or wax that makes floors slippery. If you have to use wax, make sure that it is non-skid floor wax.  Remove throw rugs and other tripping hazards from the floor. WHAT CAN I DO IN THE STAIRWAYS?  Do not leave any items on the stairs.  Make sure that there are handrails on both sides of the stairs. Fix handrails that are broken or loose. Make sure that handrails are as long as the stairways.  Check any carpeting to make sure that it is firmly attached to the stairs. Fix any carpet that is loose or worn.  Avoid having throw rugs at the top or bottom of stairways, or secure the rugs with carpet tape to prevent them from moving.  Make sure that you have a light switch at the top of the stairs and the bottom of the stairs. If you do not have them, have them installed. WHAT ARE SOME OTHER FALL PREVENTION TIPS?  Wear closed-toe shoes that fit well and support your feet. Wear shoes that have rubber soles or low heels.  When you use a stepladder, make sure that it is completely opened and that the sides are firmly locked. Have someone hold the ladder while you   are using it. Do not climb a closed stepladder.  Add color or contrast paint or tape to grab bars and handrails in your home. Place contrasting color strips on the first and last steps.  Use mobility aids as needed, such as canes, walkers, scooters, and crutches.  Turn on lights if it is dark. Replace any light bulbs that burn out.  Set up furniture so that there are clear paths. Keep the furniture in the same spot.  Fix any uneven floor surfaces.  Choose a carpet design that does not hide the edge of steps of a stairway.  Be aware of any and all pets.  Review your medicines with your healthcare provider. Some medicines can cause dizziness or changes in blood pressure, which increase your risk of falling. Talk  with your health care provider about other ways that you can decrease your risk of falls. This may include working with a physical therapist or trainer to improve your strength, balance, and endurance.   This information is not intended to replace advice given to you by your health care provider. Make sure you discuss any questions you have with your health care provider.   Document Released: 05/13/2002 Document Revised: 10/07/2014 Document Reviewed: 06/27/2014 Elsevier Interactive Patient Education 2016 [employer]                Exercise for Strong Bones  Exercise is important to build and maintain strong bones / bone density.  There are 2 types of exercises that are important to building and maintaining strong bones:  Weight- bearing and muscle-stregthening.  Weight-bearing Exercises  These exercises include activities that make you move against gravity while staying upright. Weight-bearing exercises can be high-impact or low-impact.  High-impact weight-bearing exercises help build bones and keep them strong. If you have broken a bone due to osteoporosis or are at risk of breaking a bone, you may need to avoid high-impact exercises. If you're not sure, you should check with your healthcare provider.  Examples of high-impact weight-bearing exercises are: Dancing  Doing high-impact aerobics  Hiking  Jogging/running  Jumping Rope  Stair climbing  Tennis  Low-impact weight-bearing exercises can also help keep bones strong and are a safe alternative if you cannot do high-impact exercises.   Examples of low-impact weight-bearing exercises are: Using elliptical training machines  Doing low-impact aerobics  Using stair-step machines  Fast walking on a treadmill or outside   Muscle-Strengthening Exercises These exercises include activities where you move your body, a weight or some other resistance against gravity. They are also known as resistance exercises and include: Lifting  weights  Using elastic exercise bands  Using weight machines  Lifting your own body weight  Functional movements, such as standing and rising up on your toes  Yoga and Pilates can also improve strength, balance and flexibility. However, certain positions may not be safe for people with osteoporosis or those at increased risk of broken bones. For example, exercises that have you bend forward may increase the chance of breaking a bone in the spine.   Non-Impact Exercises There are other types of exercises that can help prevent falls.  Non-impact exercises can help you to improve balance, posture and how well you move in everyday activities. Some of these exercises include: Balance exercises that strengthen your legs and test your balance, such as Tai Chi, can decrease your risk of falls.  Posture exercises that improve your posture and reduce rounded or "sloping" shoulders can help you decrease the chance   breaking a bone, especially in the spine.  Functional exercises that improve how well you move can help you with everyday activities and decrease your chance of falling and breaking a bone. For example, if you have trouble getting up from a chair or climbing stairs, you should do these activities as exercises.   **A physical therapist can teach you balance, posture and functional exercises. He/she can also help you learn which exercises are safe and appropriate for you.  [facility] has a physical therapy office in [place] in front of our office and referrals can be made for assessments and treatment as needed and strength and balance training.  If you would like to have an assessment with Chad and our physical therapy team please let a nurse or provider know.  Calcium & Vitamin D: The Facts  Why is calcium and vitamin D consumption important? Calcium: . Most Americans do not consume adequate amounts of calcium! Calcium is required for proper muscle function, nerve communication, bone  support, and many other functions in the body.  . The body uses bones as a source of calcium. Bones 'remodel' themselves continuously - the body constantly breaks bone down to release calcium and rebuilds bones by replacing calcium in the bone later.  . As we get older, the rate of bone breakdown occurs faster than bone rebuilding which could lead to osteopenia, osteoporosis, and possible fractures.   Vitamin D: . People naturally make vitamin D in the body when sunlight hits the skin and triggers a process that leads to vitamin D production. This natural vitamin D production requires about 10-15 minutes of sun exposure on the hands, arms, and face at least 2-3 times per week. However, due to decreased sun exposure and the use of sunscreen, most people will need to get additional vitamin D from foods or supplements. Your doctor can measure your body's vitamin D level through a simple blood test to determine your daily vitamin D needs.  . Vitamin D is used to help the body absorb calcium, maintain bone health, help the immune system, and reduce inflammation. It also plays a role in muscle performance, balance and risk of falling.  . Vitamin D deficiency can lead to osteomalacia or softening of the bones, bone pain, and muscle weakness.   The recommended daily allowance of Calcium and Vitamin D varies for different age groups. Age group Calcium (mg) Vitamin D (IU)  Females and Males: Age 19-50 1000 mg 600 IU  Females: Age 51- 70 1200 mg 600 IU  Males: Age 51-70 1000 mg 600 IU  Females and Males: Age 71+ 1200 mg 800 IU  Pregnant/lactating Females age 19-50 1000 mg 600 IU   How much Calcium do you get in your diet? Calcium Intake # of servings per day  Total calcium (mg)  Skim milk, 2% milk (1 cup) _________ x 300 mg   Yogurt (1 small container) _________ x 200 mg   Cheese (1oz) _________ x 200 mg   Cottage Cheese (1 cup)             ________ x 150 mg   Almond milk (1 cup) _________ x 450 mg    Fortified Orange Juice (1 cup) _________ x 300 mg   Broccoli or spinach ( 1 cup) _________ x 100 mg   Salmon (3 oz) _________ x 150 mg    Almonds (1/4 cup) _______ x 90 mg      How do we get Calcium and Vitamin D in   our diet? Calcium: . Obtaining calcium from the diet is the most preferred way to reach the recommended daily goal. If this goal is not reached through diet, calcium supplements are available.  . Calcium is found in many foods including: dairy products, dark leafy vegetables (like broccoli, kale, and spinach), fish, and fortified products like juices and cereals.  . The food label will have a %DV (percent daily value) listed showing the amount of calcium per serving. To determine the total mg per serving, simply replace the % with zero (0).  For example, Almond Breeze almond milk contains 45% DV of calcium or 450mg per 1 cup.  . You can increase the amount of calcium in your diet by using more calcium products in your daily meals. Use yogurt and fruit to make smoothies or use yogurt to top baked potatoes or make whipped potatoes. Sprinkle low fat cheese onto salads or into egg white omelets. You can even add non-fat dry milk powder (300mg calcium per 1/3 cup) to hot cereals, meat loaf, soups, or potatoes.  . Calcium supplements come in many forms including tablets, chewables, and gummies. Be sure to read the label to determine the correct number of tablets per serving and whether or not to take the supplement with food.  . Calcium carbonate products (Oscal, Caltrate, and Viactiv) are generally better absorbed when taken with food while calcium citrate products like Citracal can be taken with or without food.  . The body can only absorb about 600 mg of calcium at one time. It is recommended to take calcium supplements in small amounts several times per day.  However, taking it all at once is better than not taking it at all. . Increasing your intake of calcium is essential for bone  health, but may also lead to some side effects like constipation, increased gas, bloating or abdominal cramping. To help reduce these side effects, start with 1 tablet per day and slowly increase your intake of the supplement to the recommended doses. It is also recommended that you drink plenty of water each day. Vitamin D: . Very few foods naturally contain vitamin D. However, it is found in saltwater fish (like tuna, salmon and mackerel), beef liver, egg yolks, cheese and vitamin D fortified foods (like yogurt, cereals, orange juice and milk) . The amount of vitamin D in each food or product is listed as %DV on the product label. To determine the total amount of vitamin D per serving, drop the % sign and multiply the number by 4. For example, 1 cup of Almond Breeze almond milk contains 25% DV vitamin D or 100 IU per serving (25 x 4 =100). . Vitamin D is also found in multivitamins and supplements and may be listed as ergocalciferol (vitamin D2) or cholecalciferol (vitamin D3). Each of these forms of vitamin D are equivalent and the daily recommended intake will vary based on your age and the vitamin D levels in your body. Follow your doctor's recommendation for vitamin D intake.         

## 2015-05-09 LAB — VITAMIN D 25 HYDROXY (VIT D DEFICIENCY, FRACTURES): Vit D, 25-Hydroxy: 38.8 ng/mL (ref 30.0–100.0)

## 2015-05-09 LAB — SPECIMEN STATUS REPORT

## 2015-05-11 ENCOUNTER — Encounter: Payer: Self-pay | Admitting: Pharmacist

## 2015-05-11 NOTE — Progress Notes (Signed)
Patient ID: Karina Clark, female   DOB: [birth year], 69 y.o.   MRN: [id number]   Evista copay was $45 per month.  Patient wishes to do Prolia instead - Have already done precert and cost to patient would be about $170.  Please to administer at next appt - 05/13/2015.

## 2015-05-12 LAB — SPECIMEN STATUS REPORT

## 2015-05-12 LAB — LIPID PANEL
Chol/HDL Ratio: 2.7 ratio units (ref 0.0–4.4)
Cholesterol, Total: 206 mg/dL — ABNORMAL HIGH (ref 100–199)
HDL: 75 mg/dL (ref >39)
LDL CALC: 113 mg/dL — AB (ref 0–99)
Triglycerides: 92 mg/dL (ref 0–149)
VLDL CHOLESTEROL CAL: 18 mg/dL (ref 5–40)

## 2015-05-13 ENCOUNTER — Encounter: Payer: Self-pay | Admitting: Pharmacist

## 2015-05-13 ENCOUNTER — Encounter: Payer: Medicare Other | Admitting: Family Medicine

## 2015-05-18 ENCOUNTER — Ambulatory Visit (INDEPENDENT_AMBULATORY_CARE_PROVIDER_SITE_OTHER): Payer: Medicare Other | Admitting: Family Medicine

## 2015-05-18 ENCOUNTER — Encounter: Payer: Self-pay | Admitting: Family Medicine

## 2015-05-18 VITALS — BP 125/72 | HR 60 | Temp 97.4°F | Ht 59.0 in | Wt 105.2 lb

## 2015-05-18 DIAGNOSIS — E042 Nontoxic multinodular goiter: Secondary | ICD-10-CM

## 2015-05-18 DIAGNOSIS — Z139 Encounter for screening, unspecified: Secondary | ICD-10-CM | POA: Diagnosis not present

## 2015-05-18 DIAGNOSIS — M1990 Unspecified osteoarthritis, unspecified site: Secondary | ICD-10-CM

## 2015-05-18 DIAGNOSIS — E785 Hyperlipidemia, unspecified: Secondary | ICD-10-CM

## 2015-05-18 DIAGNOSIS — I10 Essential (primary) hypertension: Secondary | ICD-10-CM | POA: Diagnosis not present

## 2015-05-18 DIAGNOSIS — K2100 Gastro-esophageal reflux disease with esophagitis, without bleeding: Secondary | ICD-10-CM

## 2015-05-18 DIAGNOSIS — E559 Vitamin D deficiency, unspecified: Secondary | ICD-10-CM

## 2015-05-18 DIAGNOSIS — Z0000 Encounter for general adult medical examination without abnormal findings: Secondary | ICD-10-CM | POA: Diagnosis not present

## 2015-05-18 DIAGNOSIS — M8580 Other specified disorders of bone density and structure, unspecified site: Secondary | ICD-10-CM | POA: Diagnosis not present

## 2015-05-18 LAB — POCT URINALYSIS DIPSTICK
Bilirubin, UA: NEGATIVE
Blood, UA: NEGATIVE
Glucose, UA: NEGATIVE
Ketones, UA: NEGATIVE
LEUKOCYTES UA: NEGATIVE
NITRITE UA: NEGATIVE
PH UA: 7
PROTEIN UA: NEGATIVE
Spec Grav, UA: 1.01
Urobilinogen, UA: NEGATIVE

## 2015-05-18 MED ORDER — DENOSUMAB 60 MG/ML ~~LOC~~ SOLN
60.0000 mg | Freq: Once | SUBCUTANEOUS | Status: AC
  Administered 2015-05-18: 60 mg via SUBCUTANEOUS

## 2015-05-18 NOTE — Progress Notes (Deleted)
Subjective:  Patient ID: Karina Clark, female    DOB: 09/04/1945  Age: 69 y.o. MRN: [id number]  CC: No chief complaint on file.   HPI Karina Clark presents for ***  History Karina Clark has a past medical history of Hyperlipidemia; Hypertension; Anxiety; Substance abuse; Cataract; and Osteopenia.   She has past surgical history that includes Tubal ligation; Eye surgery; and Nasal septum surgery.   Her family history includes Hip fracture in her mother; Hyperlipidemia in her mother; Thyroid disease in her mother.She reports that she quit smoking about 20 years ago. Her smoking use included Cigarettes. She started smoking about 46 years ago. She smoked 0.50 packs per day. She has never used smokeless tobacco. She reports that she does not drink alcohol or use illicit drugs.  Outpatient Prescriptions Prior to Visit  Medication Sig Dispense Refill  . aspirin EC 81 MG tablet Take 81 mg by mouth daily.    Marland Kitchen BESIVANCE 0.6 % SUSP     . docusate sodium (COLACE) 100 MG capsule Take 100 mg by mouth.    . DUREZOL 0.05 % EMUL     . escitalopram (LEXAPRO) 10 MG tablet Take 1 tablet (10 mg total) by mouth daily. 90 tablet 1  . ferrous sulfate 325 (65 FE) MG tablet Take 325 mg by mouth 2 (two) times daily with a meal.    . fluconazole (DIFLUCAN) 100 MG tablet Take 1 tablet (100 mg total) by mouth daily. Except take two on day one. Take for two weeks 15 tablet 0  . hydrochlorothiazide (HYDRODIURIL) 25 MG tablet Take 12.5 mg by mouth daily. 1/2 tab daily    . mirabegron ER (MYRBETRIQ) 50 MG TB24 tablet Take 1 tablet (50 mg total) by mouth daily. 7 tablet 0  . Omega-3 1000 MG CAPS Take 1 g by mouth daily.    Marland Kitchen omeprazole (PRILOSEC) 20 MG capsule Take 1 capsule (20 mg total) by mouth daily. 90 capsule 3  . simvastatin (ZOCOR) 40 MG tablet Take 1 tablet (40 mg total) by mouth daily. 90 tablet 4  . Vitamin D, Cholecalciferol, 1000 UNITS CAPS Take 2,000 Units by mouth daily.     . Wheat Dextrin (BENEFIBER  DRINK MIX PO) Take 1 Dose by mouth 3 (three) times daily.    Marland Kitchen zolpidem (AMBIEN) 10 MG tablet Take 1 tablet (10 mg total) by mouth at bedtime as needed. 90 tablet 1   No facility-administered medications prior to visit.    ROS Review of Systems  Constitutional: Negative for fever, chills, diaphoresis, appetite change, fatigue and unexpected weight change.  HENT: Negative for congestion, ear pain, hearing loss, postnasal drip, rhinorrhea, sneezing, sore throat and trouble swallowing.   Eyes: Negative for pain.  Respiratory: Negative for cough, chest tightness and shortness of breath.   Cardiovascular: Negative for chest pain and palpitations.  Gastrointestinal: Negative for nausea, vomiting, abdominal pain, diarrhea and constipation.  Endocrine: Negative for cold intolerance, heat intolerance, polydipsia, polyphagia and polyuria.  Genitourinary: Negative for dysuria, frequency and menstrual problem.  Musculoskeletal: Negative for joint swelling and arthralgias.  Skin: Negative for rash.  Allergic/Immunologic: Negative for environmental allergies.  Neurological: Negative for dizziness, weakness, numbness and headaches.  Psychiatric/Behavioral: Negative for dysphoric mood and agitation.    Objective:  There were no vitals taken for this visit.  BP Readings from Last 3 Encounters:  04/20/15 134/63  04/17/15 115/63  03/18/15 150/86    Wt Readings from Last 3 Encounters:  05/08/15 105 lb (47.628 kg)  04/20/15 105 lb (47.628 kg)  04/17/15 105 lb 3.2 oz (47.718 kg)     Physical Exam  Constitutional: She is oriented to person, place, and time. She appears well-developed and well-nourished. No distress.  HENT:  Head: Normocephalic and atraumatic.  Right Ear: External ear normal.  Left Ear: External ear normal.  Nose: Nose normal.  Mouth/Throat: Oropharynx is clear and moist.  Eyes: Conjunctivae and EOM are normal. Pupils are equal, round, and reactive to light.  Neck: Normal  range of motion. Neck supple. No thyromegaly present.  Cardiovascular: Normal rate, regular rhythm and normal heart sounds.   No murmur heard. Pulmonary/Chest: Effort normal and breath sounds normal. No respiratory distress. She has no wheezes. She has no rales. Right breast exhibits no inverted nipple, no mass and no tenderness. Left breast exhibits no inverted nipple, no mass and no tenderness. Breasts are symmetrical.  Abdominal: Soft. Normal appearance and bowel sounds are normal. She exhibits no distension, no abdominal bruit and no mass. There is no splenomegaly or hepatomegaly. There is no tenderness. There is no tenderness at McBurney's point and negative Murphy's sign.  Genitourinary: Rectum normal. Rectal exam shows no external hemorrhoid, no internal hemorrhoid, no mass and no tenderness.  Musculoskeletal: Normal range of motion. She exhibits no edema or tenderness.  Lymphadenopathy:    She has no cervical adenopathy.  Neurological: She is alert and oriented to person, place, and time. She has normal reflexes.  Skin: Skin is warm and dry. No rash noted.  Psychiatric: She has a normal mood and affect. Her behavior is normal. Judgment and thought content normal.     Lab Results  Component Value Date   WBC 7.1 04/20/2015   HCT 29.2* 04/20/2015   GLUCOSE 84 04/20/2015   CHOL 206* 05/08/2015   TRIG 92 05/08/2015   HDL 75 05/08/2015   LDLCALC 113* 05/08/2015   ALT 13 04/20/2015   AST 22 04/20/2015   NA 140 04/20/2015   K 3.8 04/20/2015   CL 99 04/20/2015   CREATININE 0.78 04/20/2015   BUN 13 04/20/2015   CO2 27 04/20/2015    Patient was never admitted.  Assessment & Plan:   There are no diagnoses linked to this encounter. I am having Ms. Karina Clark maintain her aspirin EC, hydrochlorothiazide, Vitamin D (Cholecalciferol), docusate sodium, Omega-3, omeprazole, zolpidem, simvastatin, escitalopram, fluconazole, DUREZOL, BESIVANCE, Wheat Dextrin (BENEFIBER DRINK MIX PO), ferrous  sulfate, and mirabegron ER.  No orders of the defined types were placed in this encounter.     Follow-up: No Follow-up on file.  Claretta Fraise, M.D.

## 2015-05-18 NOTE — Addendum Note (Signed)
Addended by: Cherre Robins on: 05/18/2015 03:33 PM   Modules accepted: Orders

## 2015-05-18 NOTE — Addendum Note (Signed)
Addended by: Marin Olp on: 05/18/2015 05:36 PM   Modules accepted: Orders, SmartSet

## 2015-05-18 NOTE — Progress Notes (Addendum)
Subjective:   Karina Clark is a 69 y.o. female who presents for an Initial Medicare Annual Wellness Visit.  Review of Systems  Review of Systems  Constitutional: Negative for fever, chills, weight loss, malaise/fatigue and diaphoresis.  HENT: Negative for congestion, ear pain, hearing loss, nosebleeds, sore throat and tinnitus.   Eyes: Negative for blurred vision, double vision, photophobia, pain, discharge and redness.  Respiratory: Negative for cough, hemoptysis, sputum production, shortness of breath and wheezing.   Cardiovascular: Negative for chest pain, palpitations, orthopnea, leg swelling and PND.  Gastrointestinal: Negative for heartburn, nausea, vomiting, abdominal pain, diarrhea, constipation, blood in stool and melena.  Genitourinary: Negative for dysuria, urgency, frequency, hematuria and flank pain.  Musculoskeletal: Negative for myalgias, back pain, joint pain, falls and neck pain.  Skin: Negative for itching and rash.  Neurological: Positive for headaches. Negative for dizziness, tingling, tremors, sensory change, speech change, focal weakness, seizures, loss of consciousness and weakness.  Endo/Heme/Allergies: Negative for environmental allergies and polydipsia. Does not bruise/bleed easily.  Psychiatric/Behavioral: Negative for depression, suicidal ideas, hallucinations, memory loss and substance abuse. The patient is not nervous/anxious and does not have insomnia.      Current Medications (verified) Outpatient Encounter Prescriptions as of 05/18/2015  Medication Sig  . aspirin EC 81 MG tablet Take 81 mg by mouth daily.  Marland Kitchen BESIVANCE 0.6 % SUSP   . docusate sodium (COLACE) 100 MG capsule Take 100 mg by mouth.  . DUREZOL 0.05 % EMUL   . escitalopram (LEXAPRO) 10 MG tablet Take 1 tablet (10 mg total) by mouth daily.  . ferrous sulfate 325 (65 FE) MG tablet Take 325 mg by mouth 2 (two) times daily with a meal.  . hydrochlorothiazide (HYDRODIURIL) 25 MG tablet Take  12.5 mg by mouth daily. 1/2 tab daily  . mirabegron ER (MYRBETRIQ) 50 MG TB24 tablet Take 1 tablet (50 mg total) by mouth daily.  . Omega-3 1000 MG CAPS Take 1 g by mouth daily.  Marland Kitchen omeprazole (PRILOSEC) 20 MG capsule Take 1 capsule (20 mg total) by mouth daily.  . simvastatin (ZOCOR) 40 MG tablet Take 1 tablet (40 mg total) by mouth daily.  . Vitamin D, Cholecalciferol, 1000 UNITS CAPS Take 2,000 Units by mouth daily.   . Wheat Dextrin (BENEFIBER DRINK MIX PO) Take 1 Dose by mouth 3 (three) times daily.  Marland Kitchen zolpidem (AMBIEN) 10 MG tablet Take 1 tablet (10 mg total) by mouth at bedtime as needed.  . fluconazole (DIFLUCAN) 100 MG tablet Take 1 tablet (100 mg total) by mouth daily. Except take two on day one. Take for two weeks (Patient not taking: Reported on 05/18/2015)   No facility-administered encounter medications on file as of 05/18/2015.    Allergies (verified) Mycelex; Naproxen; and Sulfamethoxazole-trimethoprim   History: Past Medical History  Diagnosis Date  . Hyperlipidemia   . Hypertension   . Anxiety   . Substance abuse     DCed cocaine in 1998  . Cataract   . Osteopenia    Past Surgical History  Procedure Laterality Date  . Tubal ligation    . Eye surgery    . Nasal septum surgery     Family History  Problem Relation Age of Onset  . Hyperlipidemia Mother   . Thyroid disease Mother   . Hip fracture Mother    Social History   Occupational History  . Not on file.   Social History Main Topics  . Smoking status: Former Smoker -- 0.50 packs/day  Types: Cigarettes    Start date: 03/24/1969    Quit date: 02/05/1995  . Smokeless tobacco: Never Used  . Alcohol Use: No  . Drug Use: No  . Sexual Activity: Not on file    Do you feel safe at home?  Yes  Dietary issues and exercise activities:    Current Dietary habits:  Eating healthy, but not low fat   Objective:    Today's Vitals   05/18/15 1423  BP: 125/72  Pulse: 60  Temp: 97.4 F (36.3 C)    TempSrc: Oral  Height: 4\' 11"  (1.499 m)  Weight: 105 lb 3.2 oz (47.718 kg)  SpO2: 100%   Body mass index is 21.24 kg/(m^2).  Activities of Daily Living No flowsheet data found.  Are there smokers in your home (other than you)? No      Depression Screen PHQ 2/9 Scores 05/18/2015 04/20/2015 04/17/2015 03/18/2015  PHQ - 2 Score 1 0 0 0    Fall Risk Fall Risk  05/18/2015 04/20/2015 04/17/2015 03/18/2015 01/14/2015  Falls in the past year? No No No No No  Blood pressure 125/72, pulse 60, temperature 97.4 F (36.3 C), temperature source Oral, height 4\' 11"  (1.499 m), weight 105 lb 3.2 oz (47.718 kg), SpO2 100 %.  Physical Exam  Constitutional: She is oriented to person, place, and time. She appears well-developed and well-nourished. No distress.  HENT:  Head: Normocephalic and atraumatic.  Right Ear: External ear normal.  Left Ear: External ear normal.  Nose: Nose normal.  Mouth/Throat: Oropharynx is clear and moist. No oropharyngeal exudate.  Eyes: Conjunctivae and EOM are normal. Pupils are equal, round, and reactive to light. Right eye exhibits no discharge. Left eye exhibits no discharge. No scleral icterus.  Neck: Normal range of motion. Neck supple. No JVD present. No thyromegaly present.  Cardiovascular: Normal rate, regular rhythm, normal heart sounds and intact distal pulses.  Exam reveals no gallop and no friction rub.   No murmur heard. Pulmonary/Chest: Effort normal and breath sounds normal. No stridor. No respiratory distress. She has no wheezes. She has no rales. She exhibits no tenderness.  Abdominal: Soft. Bowel sounds are normal. There is no tenderness.  Musculoskeletal: Normal range of motion.  Lymphadenopathy:    She has no cervical adenopathy.  Neurological: She is alert and oriented to person, place, and time. She displays normal reflexes. No cranial nerve deficit. She exhibits normal muscle tone. Coordination normal.  Skin: Skin is warm and dry. She is not  diaphoretic.  Psychiatric: She has a normal mood and affect. Her behavior is normal.  Vitals reviewed.  Cognitive Function: No flowsheet data found.  Immunizations and Health Maintenance Immunization History  Administered Date(s) Administered  . Influenza,inj,Quad PF,36+ Mos 03/05/2015   There are no preventive care reminders to display for this patient.  Patient Care Team: Claretta Fraise, MD as PCP - General (Family Medicine) Jyl Heinz, MD as Consulting Physician (Endocrinology)  Indicate any recent Medical Services you may have received from other than [facility] providers in the past year (date may be approximate).    Assessment:    Annual Wellness Visit    Screening Tests Health Maintenance  Topic Date Due  . Hepatitis C Screening  01/14/2016 (Originally 1946-05-08)  . PNA vac Low Risk Adult (2 of 2 - PPSV23) 01/14/2016 (Originally 11/29/2014)  . TETANUS/TDAP  06/28/2015  . INFLUENZA VACCINE  01/05/2016  . MAMMOGRAM  02/01/2017  . COLONOSCOPY  07/12/2023  . DEXA SCAN  Completed  .  ZOSTAVAX  Completed        Plan:   During the course of the visit Patrici was educated and counseled about the following appropriate screening and preventive services:   Vaccines to include Pneumoccal, Influenza, Hepatitis B, Td, Zostavax,  Colorectal cancer screening  Cardiovascular disease screening  Diabetes screening  Bone Denisty / Osteoporosis Screening  Mammogram  PAP  Glaucoma screening / Diabetic Eye Exam  Nutrition counseling  Smoking cessation counseling  Advanced Directives   Goals    . Attending meditation / other stress management classes    . Exercise 3x per week (30 min per time)     Try walking, low impact aerobics, swimming, etc.    . Reduce fat intake to 50 grams per day     Low fat diet printed    . Reduce sodium intake      follow up visit 6 mos   Patient Instructions (the written plan) were given to the patient.   Kennth Vanbenschoten,  MD   05/18/2015         Prolia 60mg /mL 71ml SQ administered in office today and patient observed for 15 minutes.   Dx:  Osteopenia with high fracture risk .  TBEckard.

## 2015-05-18 NOTE — Patient Instructions (Addendum)
Fat and Cholesterol Restricted Diet High levels of fat and cholesterol in your blood may lead to various health problems, such as diseases of the heart, blood vessels, gallbladder, liver, and pancreas. Fats are concentrated sources of energy that come in various forms. Certain types of fat, including saturated fat, may be harmful in excess. Cholesterol is a substance needed by your body in small amounts. Your body makes all the cholesterol it needs. Excess cholesterol comes from the food you eat. When you have high levels of cholesterol and saturated fat in your blood, health problems can develop because the excess fat and cholesterol will gather along the walls of your blood vessels, causing them to narrow. Choosing the right foods will help you control your intake of fat and cholesterol. This will help keep the levels of these substances in your blood within normal limits and reduce your risk of disease. WHAT IS MY PLAN? Your health care provider recommends that you:  Get no more than _______30___ % of the total calories in your daily diet from fat. WHAT TYPES OF FAT SHOULD I CHOOSE?  Choose healthy fats more often. Choose monounsaturated and polyunsaturated fats, such as olive and canola oil, flaxseeds, walnuts, almonds, and seeds.  Eat more omega-3 fats. Good choices include salmon, mackerel, sardines, tuna, flaxseed oil, and ground flaxseeds. Aim to eat fish at least two times a week.  Limit saturated fats. Saturated fats are primarily found in animal products, such as meats, butter, and cream. Plant sources of saturated fats include palm oil, palm kernel oil, and coconut oil.  Avoid foods with partially hydrogenated oils in them. These contain trans fats. Examples of foods that contain trans fats are stick margarine, some tub margarines, cookies, crackers, and other baked goods. WHAT GENERAL GUIDELINES DO I NEED TO FOLLOW? These guidelines for healthy eating will help you control your intake  of fat and cholesterol:  Check food labels carefully to identify foods with trans fats or high amounts of saturated fat.  Fill one half of your plate with vegetables and green salads.  Fill one fourth of your plate with whole grains. Look for the word "whole" as the first word in the ingredient list.  Fill one fourth of your plate with lean protein foods.  Limit fruit to two servings a day. Choose fruit instead of juice.  Eat more foods that contain soluble fiber. Examples of foods that contain this type of fiber are apples, broccoli, carrots, beans, peas, and barley. Aim to get 20-30 g of fiber per day.  Eat more home-cooked food and less restaurant, buffet, and fast food.  Limit or avoid alcohol.  Limit foods high in starch and sugar.  Limit fried foods.  Cook foods using methods other than frying. Baking, boiling, grilling, and broiling are all great options.  Lose weight if you are overweight. Losing just 5-10% of your initial body weight can help your overall health and prevent diseases such as diabetes and heart disease. WHAT FOODS CAN I EAT? Grains Whole grains, such as whole wheat or whole grain breads, crackers, cereals, and pasta. Unsweetened oatmeal, bulgur, barley, quinoa, or brown rice. Corn or whole wheat flour tortillas. Vegetables Fresh or frozen vegetables (raw, steamed, roasted, or grilled). Green salads. Fruits All fresh, canned (in natural juice), or frozen fruits. Meat and Other Protein Products Ground beef (85% or leaner), grass-fed beef, or beef trimmed of fat. Skinless chicken or [place]. Ground chicken or [place]. Pork trimmed of fat. All fish and seafood. Eggs.  Dried beans, peas, or lentils. Unsalted nuts or seeds. Unsalted canned or dry beans. Dairy Low-fat dairy products, such as skim or 1% milk, 2% or reduced-fat cheeses, low-fat ricotta or cottage cheese, or plain low-fat yogurt. Fats and Oils Tub margarines without trans fats. Light or reduced-fat  mayonnaise and salad dressings. Avocado. Olive, canola, sesame, or safflower oils. Natural peanut or almond butter (choose ones without added sugar and oil). The items listed above may not be a complete list of recommended foods or beverages. Contact your dietitian for more options. WHAT FOODS ARE NOT RECOMMENDED? Grains White bread. White pasta. White rice. Cornbread. Bagels, pastries, and croissants. Crackers that contain trans fat. Vegetables White potatoes. Corn. Creamed or fried vegetables. Vegetables in a cheese sauce. Fruits Dried fruits. Canned fruit in light or heavy syrup. Fruit juice. Meat and Other Protein Products Fatty cuts of meat. Ribs, chicken wings, bacon, sausage, bologna, salami, chitterlings, fatback, hot dogs, bratwurst, and packaged luncheon meats. Liver and organ meats. Dairy Whole or 2% milk, cream, half-and-half, and cream cheese. Whole milk cheeses. Whole-fat or sweetened yogurt. Full-fat cheeses. Nondairy creamers and whipped toppings. Processed cheese, cheese spreads, or cheese curds. Sweets and Desserts Corn syrup, sugars, honey, and molasses. Candy. Jam and jelly. Syrup. Sweetened cereals. Cookies, pies, cakes, donuts, muffins, and ice cream. Fats and Oils Butter, stick margarine, lard, shortening, ghee, or bacon fat. Coconut, palm kernel, or palm oils. Beverages Alcohol. Sweetened drinks (such as sodas, lemonade, and fruit drinks or punches). The items listed above may not be a complete list of foods and beverages to avoid. Contact your dietitian for more information.   This information is not intended to replace advice given to you by your health care provider. Make sure you discuss any questions you have with your health care provider.   Document Released: 05/23/2005 Document Revised: 06/13/2014 Document Reviewed: 08/21/2013 Elsevier Interactive Patient Education [employer].

## 2015-05-19 DIAGNOSIS — H2511 Age-related nuclear cataract, right eye: Secondary | ICD-10-CM | POA: Diagnosis not present

## 2015-05-20 DIAGNOSIS — H2512 Age-related nuclear cataract, left eye: Secondary | ICD-10-CM | POA: Diagnosis not present

## 2015-05-20 DIAGNOSIS — H25012 Cortical age-related cataract, left eye: Secondary | ICD-10-CM | POA: Diagnosis not present

## 2015-05-20 LAB — FECAL OCCULT BLOOD, IMMUNOCHEMICAL: FECAL OCCULT BLD: NEGATIVE

## 2015-05-21 LAB — PAP IG W/ RFLX HPV ASCU: PAP SMEAR COMMENT: 0

## 2015-05-21 NOTE — Progress Notes (Signed)
Quick Note:  Your recent Pap smear performed in the office came back normal. ______

## 2015-05-26 DIAGNOSIS — H2512 Age-related nuclear cataract, left eye: Secondary | ICD-10-CM | POA: Diagnosis not present

## 2015-06-04 ENCOUNTER — Ambulatory Visit: Payer: Self-pay | Admitting: Pharmacist

## 2015-06-30 ENCOUNTER — Other Ambulatory Visit: Payer: Self-pay | Admitting: *Deleted

## 2015-06-30 MED ORDER — HYDROCHLOROTHIAZIDE 25 MG PO TABS: 25.0000 mg | ORAL_TABLET | Freq: Every day | ORAL | Status: DC

## 2015-06-30 NOTE — Telephone Encounter (Signed)
Please review and advise.

## 2015-06-30 NOTE — Telephone Encounter (Signed)
Came by fax, med list says 1/2 qd. We have never ordered.

## 2015-07-16 ENCOUNTER — Telehealth: Payer: Self-pay | Admitting: Pharmacist

## 2015-07-16 NOTE — Telephone Encounter (Signed)
Over the last 4 weeks she has been have increased pain in her lower back which radiates into her hip and sometimes her leg.  She sees a Restaurant manager, fast food.  He mentioned that it might be the sciatic nerve.   It does not sound like the type of pain that is sometimes experienced with prolia and other osteoporosis medications.  I offered appointment with PCP but she declined.  She is encouraged that is pain continues she should call back.

## 2015-07-23 ENCOUNTER — Encounter: Payer: Self-pay | Admitting: Pediatrics

## 2015-07-23 ENCOUNTER — Ambulatory Visit (INDEPENDENT_AMBULATORY_CARE_PROVIDER_SITE_OTHER): Payer: Medicare Other | Admitting: Pediatrics

## 2015-07-23 VITALS — BP 137/67 | HR 61 | Temp 97.5°F | Ht 59.0 in | Wt 105.2 lb

## 2015-07-23 DIAGNOSIS — K146 Glossodynia: Secondary | ICD-10-CM | POA: Diagnosis not present

## 2015-07-23 MED ORDER — MAGIC MOUTHWASH W/LIDOCAINE: 5.0000 mL | Freq: Three times a day (TID) | ORAL | Status: DC | PRN

## 2015-07-23 NOTE — Progress Notes (Signed)
Subjective:    Patient ID: Karina Clark, female    DOB: [birth year], 70 y.o.   MRN: [id number]  CC: Oral Pain   HPI: Karina Clark is a 70 y.o. female presenting for Oral Pain  Ongoing off and on for several weeks.  Has not noticed any lesions in her mouth Feels like her tongue, lips, inner lips, and buccal surfaces are burning at times At first thought due to eye drops started by eye doctor Stopped the eye drops (xidra) 4 weeks ago, made little difference to symptoms Eyes watering, nose running, also started on allergy medicine Mouth burning is worse with food Has had thrush before, thought it was that but has not noticed white areas as she had last time.   Depression screen [facility] 2/9 05/18/2015 04/20/2015 04/17/2015 03/18/2015 01/14/2015  Decreased Interest 0 0 0 0 0  Down, Depressed, Hopeless 1 0 0 0 0  PHQ - 2 Score 1 0 0 0 0     Relevant past medical, surgical, family and social history reviewed and updated as indicated. Interim medical history since our last visit reviewed. Allergies and medications reviewed and updated.    ROS: Per HPI unless specifically indicated above  History  Smoking status  . Former Smoker -- 0.50 packs/day  . Types: Cigarettes  . Start date: 03/24/1969  . Quit date: 02/05/1995  Smokeless tobacco  . Never Used    Past Medical History Patient Active Problem List   Diagnosis Date Noted  . Cannot sleep 12/26/2014  . Multinodular goiter 01/31/2014  . Anxiety 11/08/2011  . Allergic rhinitis 11/01/2011  . HLD (hyperlipidemia) 11/01/2011  . BP (high blood pressure) 11/01/2011  . 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 Arthritis, degenerative 11/01/2011  . Osteopenia 11/01/2011  . Mixed incontinence 11/01/2011  . Avitaminosis D 11/01/2011  . Acid reflux 12/01/2009    Current Outpatient Prescriptions  Medication Sig Dispense Refill  . aspirin EC 81 MG tablet Take 81 mg by mouth daily.    Marland Kitchen BESIVANCE 0.6 % SUSP     . docusate  sodium (COLACE) 100 MG capsule Take 100 mg by mouth.    . DUREZOL 0.05 % EMUL     . escitalopram (LEXAPRO) 10 MG tablet Take 1 tablet (10 mg total) by mouth daily. 90 tablet 1  . ferrous sulfate 325 (65 FE) MG tablet Take 325 mg by mouth 2 (two) times daily with a meal.    . hydrochlorothiazide (HYDRODIURIL) 25 MG tablet Take 1 tablet (25 mg total) by mouth daily. 90 tablet 1  . Omega-3 1000 MG CAPS Take 1 g by mouth daily.    Marland Kitchen omeprazole (PRILOSEC) 20 MG capsule Take 1 capsule (20 mg total) by mouth daily. 90 capsule 3  . simvastatin (ZOCOR) 40 MG tablet Take 1 tablet (40 mg total) by mouth daily. 90 tablet 4  . Vitamin D, Cholecalciferol, 1000 UNITS CAPS Take 2,000 Units by mouth daily.     . Wheat Dextrin (BENEFIBER DRINK MIX PO) Take 1 Dose by mouth 3 (three) times daily.    Marland Kitchen zolpidem (AMBIEN) 10 MG tablet Take 1 tablet (10 mg total) by mouth at bedtime as needed. 90 tablet 1   No current facility-administered medications for this visit.       Objective:    BP 137/67 mmHg  Pulse 61  Temp(Src) 97.5 F (36.4 C) (Oral)  Ht 4\' 11"  (1.499 m)  Wt 105 lb 3.2 oz (47.718 kg)  BMI 21.24 kg/m2  Wt Readings from Last 3 Encounters:  07/23/15 105 lb 3.2 oz (47.718 kg)  05/18/15 105 lb 3.2 oz (47.718 kg)  05/08/15 105 lb (47.628 kg)     Gen: NAD, alert, cooperative with exam, NCAT EYES: EOMI, no scleral injection or icterus ENT:  TMs pearly gray b/l, OP without erythema, no lesions, no redness. Poor dentition. LYMPH: no cervical LAD CV: NRRR, normal S1/S2, no murmur, distal pulses 2+ b/l Resp: CTABL, no wheezes, normal WOB Abd: +BS, soft, NTND. no guarding or organomegaly Ext: No edema, warm Neuro: Alert and oriented     Assessment & Plan:    Karina Clark was seen today for oral pain, ongoing several weeks, post-menopausal. Will check vitamin levels and TSH as can cause similar symptoms. Gave paper Rx for magic mouthwash. Let me know if not improving. No lesions to swab for HSV, no  plaques for thrush. Not on any steroids to increase thrush risk.  Diagnoses and all orders for this visit:  Burning mouth syndrome -     Vitamin B12 -     TSH -     Vitamin B6 -     Zinc -     Folate   Follow up plan: As needed  Assunta Found, MD [facility] 07/23/2015, 5:28 PM

## 2015-07-27 LAB — VITAMIN B6

## 2015-07-27 LAB — ZINC: ZINC: 118 ug/dL (ref 56–134)

## 2015-07-27 LAB — TSH: TSH: 2.52 u[IU]/mL (ref 0.450–4.500)

## 2015-07-27 LAB — VITAMIN B12: VITAMIN B 12: 608 pg/mL (ref 211–946)

## 2015-07-27 LAB — FOLATE: Folate: >20 ng/mL (ref >3.0)

## 2015-07-27 NOTE — Progress Notes (Signed)
Patient aware.

## 2015-07-28 ENCOUNTER — Other Ambulatory Visit: Payer: Self-pay | Admitting: Family Medicine

## 2015-07-29 DIAGNOSIS — E041 Nontoxic single thyroid nodule: Secondary | ICD-10-CM | POA: Diagnosis not present

## 2015-07-29 DIAGNOSIS — E049 Nontoxic goiter, unspecified: Secondary | ICD-10-CM | POA: Diagnosis not present

## 2015-07-29 MED ORDER — ZOLPIDEM TARTRATE 10 MG PO TABS: 10.0000 mg | ORAL_TABLET | Freq: Every evening | ORAL | Status: DC | PRN

## 2015-07-29 NOTE — Telephone Encounter (Signed)
Please review and advise.

## 2015-07-29 NOTE — Telephone Encounter (Signed)
Last filled 01/14/15 with 1 RF, last seen 05/18/15. Call in at [facility] [phone]

## 2015-07-29 NOTE — Telephone Encounter (Signed)
Rx for Ambien called into [facility] per Dr Livia Snellen

## 2015-08-07 DIAGNOSIS — E042 Nontoxic multinodular goiter: Secondary | ICD-10-CM | POA: Diagnosis not present

## 2015-08-21 ENCOUNTER — Encounter: Payer: Self-pay | Admitting: Family Medicine

## 2015-08-21 ENCOUNTER — Ambulatory Visit (INDEPENDENT_AMBULATORY_CARE_PROVIDER_SITE_OTHER): Payer: Medicare Other | Admitting: Family Medicine

## 2015-08-21 VITALS — BP 137/64 | HR 60 | Temp 97.1°F | Ht 59.0 in | Wt 106.2 lb

## 2015-08-21 DIAGNOSIS — K117 Disturbances of salivary secretion: Secondary | ICD-10-CM

## 2015-08-21 DIAGNOSIS — R682 Dry mouth, unspecified: Secondary | ICD-10-CM

## 2015-08-21 DIAGNOSIS — K146 Glossodynia: Secondary | ICD-10-CM

## 2015-08-21 MED ORDER — ESCITALOPRAM OXALATE 10 MG PO TABS: 5.0000 mg | ORAL_TABLET | Freq: Every day | ORAL | Status: DC

## 2015-08-21 NOTE — Patient Instructions (Addendum)
Decrease lexapro to 1/2 tablet daily. After two weeks, if dry mouth persists, discontinue the medication completely.

## 2015-08-21 NOTE — Progress Notes (Signed)
Subjective:  Patient ID: Karina Clark, female    DOB: [birth year]  Age: 70 y.o. MRN: [id number]  CC: mouth and tongue issues   HPI Karina Clark presents for Continued dryness of the mouth and soreness of the tongue. The tongue has a burning sensation. This is not affected by food intake. Taste sensation is not apparently affected. Of note is that she was seen for this recently and had a normal zinc level normal B vitamin levels with the exception of B6 not being run. The patient has been taking Lexapro for many years but was started just a few months ago on a diuretic. The dryness seems to increased over the last 1-2 months. Of note is that she's also been diagnosed with dry eyes but could not tolerate the medication given by ophthalmology.  Depression screen Karina Clark 2/9 05/18/2015 04/20/2015 04/17/2015 03/18/2015 01/14/2015  Decreased Interest 0 0 0 0 0  Down, Depressed, Hopeless 1 0 0 0 0  PHQ - 2 Score 1 0 0 0 0       History Karina Clark has a past medical history of Hyperlipidemia; Hypertension; Anxiety; Substance abuse; Cataract; and Osteopenia.   She has past surgical history that includes Tubal ligation; Eye surgery; and Nasal septum surgery.   Her family history includes Hip fracture in her mother; Hyperlipidemia in her mother; Thyroid disease in her mother.She reports that she quit smoking about 20 years ago. Her smoking use included Cigarettes. She started smoking about 46 years ago. She smoked 0.50 packs per day. She has never used smokeless tobacco. She reports that she does not drink alcohol or use illicit drugs.  Current Outpatient Prescriptions on File Prior to Visit  Medication Sig Dispense Refill  . aspirin EC 81 MG tablet Take 81 mg by mouth daily.    Marland Kitchen docusate sodium (COLACE) 100 MG capsule Take 100 mg by mouth.    . ferrous sulfate 325 (65 FE) MG tablet Take 325 mg by mouth 2 (two) times daily with a meal.    . Omega-3 1000 MG CAPS Take 1 g by mouth daily.    Marland Kitchen  omeprazole (PRILOSEC) 20 MG capsule Take 1 capsule (20 mg total) by mouth daily. 90 capsule 3  . simvastatin (ZOCOR) 40 MG tablet Take 1 tablet (40 mg total) by mouth daily. 90 tablet 4  . Vitamin D, Cholecalciferol, 1000 UNITS CAPS Take 2,000 Units by mouth daily.     . Wheat Dextrin (BENEFIBER DRINK MIX PO) Take 1 Dose by mouth 3 (three) times daily.    Marland Kitchen zolpidem (AMBIEN) 10 MG tablet Take 1 tablet (10 mg total) by mouth at bedtime as needed. 90 tablet 0   No current facility-administered medications on file prior to visit.     ROS Review of Systems  Constitutional: Negative for fever, activity change and appetite change.  HENT: Positive for mouth sores and sore throat. Negative for congestion and rhinorrhea.   Eyes: Positive for pain and redness. Negative for visual disturbance.  Respiratory: Negative for cough and shortness of breath.   Cardiovascular: Negative for chest pain and palpitations.  Gastrointestinal: Negative for nausea, abdominal pain and diarrhea.  Genitourinary: Negative for dysuria.  Musculoskeletal: Negative for myalgias and arthralgias.  Psychiatric/Behavioral: The patient is nervous/anxious.     Objective:  BP 137/64 mmHg  Pulse 60  Temp(Src) 97.1 F (36.2 C) (Oral)  Ht 4\' 11"  (1.499 m)  Wt 106 lb 3.2 oz (48.172 kg)  BMI 21.44 kg/m2  BP Readings  from Last 3 Encounters:  08/21/15 137/64  07/23/15 137/67  05/18/15 125/72    Wt Readings from Last 3 Encounters:  08/21/15 106 lb 3.2 oz (48.172 kg)  07/23/15 105 lb 3.2 oz (47.718 kg)  05/18/15 105 lb 3.2 oz (47.718 kg)     Physical Exam  Constitutional: She is oriented to person, place, and time. She appears well-developed and well-nourished. No distress.  HENT:  Head: Normocephalic and atraumatic.  Right Ear: External ear normal.  Left Ear: External ear normal.  Nose: Nose normal.  Mouth/Throat: Oropharynx is clear and moist.  Eyes: Conjunctivae and EOM are normal. Pupils are equal, round, and  reactive to light.  Neck: Normal range of motion. Neck supple. No thyromegaly present.  Cardiovascular: Normal rate, regular rhythm and normal heart sounds.   No murmur heard. Pulmonary/Chest: Effort normal and breath sounds normal. No respiratory distress. She has no wheezes. She has no rales.  Abdominal: Soft. Bowel sounds are normal. She exhibits no distension. There is no tenderness.  Lymphadenopathy:    She has no cervical adenopathy.  Neurological: She is alert and oriented to person, place, and time. She has normal reflexes.  Skin: Skin is warm and dry.  Psychiatric: She has a normal mood and affect. Her behavior is normal. Judgment and thought content normal.     Lab Results  Component Value Date   WBC 7.1 04/20/2015   HCT 29.2* 04/20/2015   PLT 250 04/20/2015   GLUCOSE 84 04/20/2015   CHOL 206* 05/08/2015   TRIG 92 05/08/2015   HDL 75 05/08/2015   LDLCALC 113* 05/08/2015   ALT 13 04/20/2015   AST 22 04/20/2015   NA 140 04/20/2015   K 3.8 04/20/2015   CL 99 04/20/2015   CREATININE 0.78 04/20/2015   BUN 13 04/20/2015   CO2 27 04/20/2015   TSH 2.520 07/23/2015    Patient was never admitted.  Assessment & Plan:   Karina Clark was seen today for mouth and tongue issues.  Diagnoses and all orders for this visit:  Burning mouth syndrome -     Sjogren's syndrome antibods(ssa + ssb) -     Vitamin B6  Xerostomia -     Sjogren's syndrome antibods(ssa + ssb) -     Vitamin B6  Other orders -     escitalopram (LEXAPRO) 10 MG tablet; Take 0.5 tablets (5 mg total) by mouth daily.   I have discontinued Karina Clark's DUREZOL, BESIVANCE, hydrochlorothiazide, and magic mouthwash w/lidocaine. I have also changed her escitalopram. Additionally, I am having her maintain her aspirin EC, Vitamin D (Cholecalciferol), docusate sodium, Omega-3, omeprazole, simvastatin, Wheat Dextrin (BENEFIBER DRINK MIX PO), ferrous sulfate, and zolpidem.  Meds ordered this encounter  Medications  .  escitalopram (LEXAPRO) 10 MG tablet    Sig: Take 0.5 tablets (5 mg total) by mouth daily.    Dispense:  1 tablet    Refill:  0   Decrease lexapro to 1/2 tablet daily. After two weeks, if dry mouth persists, discontinue the medication completely.  Follow-up: Return in about 2 weeks (around 09/04/2015), or if symptoms worsen or fail to improve.  Karina Clark, M.D.

## 2015-08-25 LAB — SJOGREN'S SYNDROME ANTIBODS(SSA + SSB)
ENA SSA (RO) Ab: <0.2 AI (ref 0.0–0.9)
ENA SSB (LA) Ab: <0.2 AI (ref 0.0–0.9)

## 2015-08-25 LAB — VITAMIN B6: Vitamin B6: >100 ug/L — ABNORMAL HIGH (ref 2.0–32.8)

## 2015-08-26 ENCOUNTER — Telehealth: Payer: Self-pay | Admitting: Family Medicine

## 2015-08-26 NOTE — Telephone Encounter (Signed)
Pt aware of medication changes & appt was made for BP ck in 2 wks

## 2015-09-04 ENCOUNTER — Encounter: Payer: Self-pay | Admitting: Family Medicine

## 2015-09-04 ENCOUNTER — Ambulatory Visit (INDEPENDENT_AMBULATORY_CARE_PROVIDER_SITE_OTHER): Payer: Medicare Other

## 2015-09-04 ENCOUNTER — Ambulatory Visit (INDEPENDENT_AMBULATORY_CARE_PROVIDER_SITE_OTHER): Payer: Medicare Other | Admitting: Family Medicine

## 2015-09-04 VITALS — BP 125/69 | HR 62 | Temp 98.2°F | Ht 59.0 in | Wt 106.6 lb

## 2015-09-04 DIAGNOSIS — F419 Anxiety disorder, unspecified: Secondary | ICD-10-CM | POA: Diagnosis not present

## 2015-09-04 DIAGNOSIS — I10 Essential (primary) hypertension: Secondary | ICD-10-CM | POA: Diagnosis not present

## 2015-09-04 DIAGNOSIS — K117 Disturbances of salivary secretion: Secondary | ICD-10-CM | POA: Diagnosis not present

## 2015-09-04 DIAGNOSIS — M1612 Unilateral primary osteoarthritis, left hip: Secondary | ICD-10-CM | POA: Diagnosis not present

## 2015-09-04 DIAGNOSIS — R682 Dry mouth, unspecified: Secondary | ICD-10-CM

## 2015-09-04 MED ORDER — BETAMETHASONE SOD PHOS & ACET 6 (3-3) MG/ML IJ SUSP
6.0000 mg | Freq: Once | INTRAMUSCULAR | Status: AC
  Administered 2015-09-04: 6 mg via INTRAMUSCULAR

## 2015-09-04 NOTE — Progress Notes (Signed)
Subjective:  Patient ID: Karina Clark, female    DOB: [birth year]  Age: 70 y.o. MRN: [id number]  CC: Hypertension and L hip pain   HPI Karina Clark presents for  follow-up of hypertension. Patient has no history of headache chest pain or shortness of breath or recent cough. Patient also denies symptoms of TIA such as numbness weakness lateralizing. Patient checks  blood pressure at home and has not had any elevated readings recently. Patient off meds due to dry mouth. Dry mouth is somewhat better. Still bothersome. No relapse into depression with lower dose of lexapro.  Left hip pain for several weeks. She has seen chiropractor who has done some spinal adjustments. It has been gotten somewhat better but is still 5-6/10 frequently through the day. Worsened by ambulation.   History Karina Clark has a past medical history of Hyperlipidemia; Hypertension; Anxiety; Substance abuse; Cataract; and Osteopenia.   She has past surgical history that includes Tubal ligation; Eye surgery; and Nasal septum surgery.   Her family history includes Hip fracture in her mother; Hyperlipidemia in her mother; Thyroid disease in her mother.She reports that she quit smoking about 20 years ago. Her smoking use included Cigarettes. She started smoking about 46 years ago. She smoked 0.50 packs per day. She has never used smokeless tobacco. She reports that she does not drink alcohol or use illicit drugs.  Current Outpatient Prescriptions on File Prior to Visit  Medication Sig Dispense Refill  . aspirin EC 81 MG tablet Take 81 mg by mouth daily.    Marland Kitchen docusate sodium (COLACE) 100 MG capsule Take 100 mg by mouth.    . ferrous sulfate 325 (65 FE) MG tablet Take 325 mg by mouth 2 (two) times daily with a meal.    . Omega-3 1000 MG CAPS Take 1 g by mouth daily.    Marland Kitchen omeprazole (PRILOSEC) 20 MG capsule Take 1 capsule (20 mg total) by mouth daily. 90 capsule 3  . simvastatin (ZOCOR) 40 MG tablet Take 1 tablet (40 mg  total) by mouth daily. 90 tablet 4  . Vitamin D, Cholecalciferol, 1000 UNITS CAPS Take 2,000 Units by mouth daily.     . Wheat Dextrin (BENEFIBER DRINK MIX PO) Take 1 Dose by mouth 3 (three) times daily.    Marland Kitchen zolpidem (AMBIEN) 10 MG tablet Take 1 tablet (10 mg total) by mouth at bedtime as needed. 90 tablet 0  . escitalopram (LEXAPRO) 10 MG tablet Take 0.5 tablets (5 mg total) by mouth daily. (Patient not taking: Reported on 09/04/2015) 1 tablet 0   No current facility-administered medications on file prior to visit.    ROS Review of Systems  Constitutional: Negative for fever, activity change and appetite change.  HENT: Negative for congestion, rhinorrhea and sore throat.   Eyes: Negative for visual disturbance.  Respiratory: Negative for cough and shortness of breath.   Cardiovascular: Negative for chest pain and palpitations.  Gastrointestinal: Negative for nausea, abdominal pain and diarrhea.  Genitourinary: Negative for dysuria.  Musculoskeletal: Negative for myalgias and arthralgias.    Objective:  BP 125/69 mmHg  Pulse 62  Temp(Src) 98.2 F (36.8 C) (Oral)  Ht 4\' 11"  (1.499 m)  Wt 106 lb 9.6 oz (48.353 kg)  BMI 21.52 kg/m2  SpO2 100%  BP Readings from Last 3 Encounters:  09/04/15 125/69  08/21/15 137/64  07/23/15 137/67    Wt Readings from Last 3 Encounters:  09/04/15 106 lb 9.6 oz (48.353 kg)  08/21/15 106 lb 3.2  oz (48.172 kg)  07/23/15 105 lb 3.2 oz (47.718 kg)     Physical Exam  Constitutional: She is oriented to person, place, and time. She appears well-developed and well-nourished. No distress.  HENT:  Head: Normocephalic and atraumatic.  Right Ear: External ear normal.  Left Ear: External ear normal.  Nose: Nose normal.  Mouth/Throat: Oropharynx is clear and moist.  Eyes: Conjunctivae and EOM are normal. Pupils are equal, round, and reactive to light.  Neck: Normal range of motion. Neck supple. No thyromegaly present.  Cardiovascular: Normal rate,  regular rhythm and normal heart sounds.   No murmur heard. Pulmonary/Chest: Effort normal and breath sounds normal. No respiratory distress. She has no wheezes. She has no rales.  Abdominal: Soft. Bowel sounds are normal. She exhibits no distension. There is no tenderness.  Lymphadenopathy:    She has no cervical adenopathy.  Neurological: She is alert and oriented to person, place, and time. She has normal reflexes.  Skin: Skin is warm and dry.  Psychiatric: She has a normal mood and affect. Her behavior is normal. Judgment and thought content normal.     Lab Results  Component Value Date   WBC 7.1 04/20/2015   HCT 29.2* 04/20/2015   PLT 250 04/20/2015   GLUCOSE 84 04/20/2015   CHOL 206* 05/08/2015   TRIG 92 05/08/2015   HDL 75 05/08/2015   LDLCALC 113* 05/08/2015   ALT 13 04/20/2015   AST 22 04/20/2015   NA 140 04/20/2015   K 3.8 04/20/2015   CL 99 04/20/2015   CREATININE 0.78 04/20/2015   BUN 13 04/20/2015   CO2 27 04/20/2015   TSH 2.520 07/23/2015    Patient was never admitted.  Assessment & Plan:   Karina Clark was seen today for hypertension and l hip pain.  Diagnoses and all orders for this visit:  Essential hypertension  Osteoarthritis of left hip, unspecified osteoarthritis type -     DG HIP UNILAT W OR W/O PELVIS 2-3 VIEWS LEFT; Future -     betamethasone acetate-betamethasone sodium phosphate (CELESTONE) injection 6 mg; Inject 1 mL (6 mg total) into the muscle once.  Anxiety  Xerostomia   I am having Karina Clark maintain her aspirin EC, Vitamin D (Cholecalciferol), docusate sodium, Omega-3, omeprazole, simvastatin, Wheat Dextrin (BENEFIBER DRINK MIX PO), ferrous sulfate, zolpidem, escitalopram, and hydrochlorothiazide. We will continue to administer betamethasone acetate-betamethasone sodium phosphate.  Meds ordered this encounter  Medications  . hydrochlorothiazide (HYDRODIURIL) 25 MG tablet    Sig: Reported on 09/04/2015  . betamethasone  acetate-betamethasone sodium phosphate (CELESTONE) injection 6 mg    Sig:       Follow-up: Return in about 1 month (around 10/04/2015).  Claretta Fraise, M.D.

## 2015-09-04 NOTE — Addendum Note (Signed)
Addended by: Marin Olp on: 09/04/2015 11:46 AM   Modules accepted: Miquel Dunn

## 2015-09-08 ENCOUNTER — Telehealth: Payer: Self-pay | Admitting: Family Medicine

## 2015-09-08 NOTE — Telephone Encounter (Signed)
Okay to increase to a whole lexapro daily

## 2015-09-15 NOTE — Telephone Encounter (Signed)
Patient aware.

## 2015-10-05 ENCOUNTER — Ambulatory Visit (INDEPENDENT_AMBULATORY_CARE_PROVIDER_SITE_OTHER): Payer: Medicare Other | Admitting: Family Medicine

## 2015-10-05 ENCOUNTER — Encounter: Payer: Self-pay | Admitting: Family Medicine

## 2015-10-05 VITALS — BP 128/71 | HR 71 | Temp 98.6°F | Ht 59.0 in | Wt 106.0 lb

## 2015-10-05 DIAGNOSIS — K144 Atrophy of tongue papillae: Secondary | ICD-10-CM

## 2015-10-05 DIAGNOSIS — K5900 Constipation, unspecified: Secondary | ICD-10-CM

## 2015-10-05 DIAGNOSIS — R350 Frequency of micturition: Secondary | ICD-10-CM

## 2015-10-05 DIAGNOSIS — I10 Essential (primary) hypertension: Secondary | ICD-10-CM | POA: Diagnosis not present

## 2015-10-05 DIAGNOSIS — F419 Anxiety disorder, unspecified: Secondary | ICD-10-CM | POA: Diagnosis not present

## 2015-10-05 LAB — URINALYSIS
BILIRUBIN UA: NEGATIVE
GLUCOSE, UA: NEGATIVE
Ketones, UA: NEGATIVE
LEUKOCYTES UA: NEGATIVE
Nitrite, UA: NEGATIVE
PH UA: 8 — AB (ref 5.0–7.5)
PROTEIN UA: NEGATIVE
RBC UA: NEGATIVE
SPEC GRAV UA: 1.015 (ref 1.005–1.030)
Urobilinogen, Ur: 0.2 mg/dL (ref 0.2–1.0)

## 2015-10-05 MED ORDER — LINACLOTIDE 145 MCG PO CAPS: 145.0000 ug | ORAL_CAPSULE | Freq: Every day | ORAL | Status: DC

## 2015-10-05 MED ORDER — PSYLLIUM 28.3 % PO POWD: ORAL | Status: DC

## 2015-10-05 NOTE — Progress Notes (Signed)
Subjective:  Patient ID: Karina Clark, female    DOB: [birth year]  Age: 70 y.o. MRN: [id number]  CC: Hypertension; Constipation; Hemorrhoids; and sore tongue   HPI Leasha Karow presents for  follow-up of hypertension. Patient has no history of headache chest pain or shortness of breath or recent cough. Patient also denies symptoms of TIA such as numbness weakness lateralizing. Patient checks  blood pressure at home and has not had any elevated readings recently. Patient off meds due to dry mouth. Dry mouth is somewhat better. Still bothersome. No relapse into depression with lower dose of lexapro.  Left hip pain for several weeks. She has seen chiropractor who has done some spinal adjustments. It has been gotten somewhat better but is still 5-6/10 frequently through the day. Worsened by ambulation.   History Charmayne has a past medical history of Hyperlipidemia; Hypertension; Anxiety; Substance abuse; Cataract; and Osteopenia.   She has past surgical history that includes Tubal ligation; Eye surgery; and Nasal septum surgery.   Her family history includes Hip fracture in her mother; Hyperlipidemia in her mother; Thyroid disease in her mother.She reports that she quit smoking about 20 years ago. Her smoking use included Cigarettes. She started smoking about 46 years ago. She smoked 0.50 packs per day. She has never used smokeless tobacco. She reports that she does not drink alcohol or use illicit drugs.  Current Outpatient Prescriptions on File Prior to Visit  Medication Sig Dispense Refill  . aspirin EC 81 MG tablet Take 81 mg by mouth daily.    Marland Kitchen docusate sodium (COLACE) 100 MG capsule Take 100 mg by mouth.    . escitalopram (LEXAPRO) 10 MG tablet Take 0.5 tablets (5 mg total) by mouth daily. 1 tablet 0  . Omega-3 1000 MG CAPS Take 1 g by mouth daily.    Marland Kitchen omeprazole (PRILOSEC) 20 MG capsule Take 1 capsule (20 mg total) by mouth daily. 90 capsule 3  . simvastatin (ZOCOR) 40 MG  tablet Take 1 tablet (40 mg total) by mouth daily. 90 tablet 4  . Vitamin D, Cholecalciferol, 1000 UNITS CAPS Take 2,000 Units by mouth daily.     . Wheat Dextrin (BENEFIBER DRINK MIX PO) Take 1 Dose by mouth 3 (three) times daily.    Marland Kitchen zolpidem (AMBIEN) 10 MG tablet Take 1 tablet (10 mg total) by mouth at bedtime as needed. 90 tablet 0  . hydrochlorothiazide (HYDRODIURIL) 25 MG tablet Reported on 10/05/2015     No current facility-administered medications on file prior to visit.    ROS Review of Systems  Constitutional: Negative for fever, activity change and appetite change.  HENT: Negative for congestion, rhinorrhea and sore throat.   Eyes: Negative for visual disturbance.  Respiratory: Negative for cough and shortness of breath.   Cardiovascular: Negative for chest pain and palpitations.  Gastrointestinal: Negative for nausea, abdominal pain and diarrhea.  Genitourinary: Negative for dysuria.  Musculoskeletal: Negative for myalgias and arthralgias.    Objective:  BP 128/71 mmHg  Pulse 71  Temp(Src) 98.6 F (37 C) (Oral)  Ht 4\' 11"  (1.499 m)  Wt 106 lb (48.081 kg)  BMI 21.40 kg/m2  SpO2 100%  BP Readings from Last 3 Encounters:  10/05/15 128/71  09/04/15 125/69  08/21/15 137/64    Wt Readings from Last 3 Encounters:  10/05/15 106 lb (48.081 kg)  09/04/15 106 lb 9.6 oz (48.353 kg)  08/21/15 106 lb 3.2 oz (48.172 kg)     Physical Exam  Constitutional: She  is oriented to person, place, and time. She appears well-developed and well-nourished. No distress.  HENT:  Head: Normocephalic and atraumatic.  Right Ear: External ear normal.  Left Ear: External ear normal.  Nose: Nose normal.  Mouth/Throat: Oropharynx is clear and moist.  Eyes: Conjunctivae and EOM are normal. Pupils are equal, round, and reactive to light.  Neck: Normal range of motion. Neck supple. No thyromegaly present.  Cardiovascular: Normal rate, regular rhythm and normal heart sounds.   No murmur  heard. Pulmonary/Chest: Effort normal and breath sounds normal. No respiratory distress. She has no wheezes. She has no rales.  Abdominal: Soft. Bowel sounds are normal. She exhibits no distension. There is no tenderness.  Lymphadenopathy:    She has no cervical adenopathy.  Neurological: She is alert and oriented to person, place, and time. She has normal reflexes.  Skin: Skin is warm and dry.  Psychiatric: She has a normal mood and affect. Her behavior is normal. Judgment and thought content normal.     Lab Results  Component Value Date   WBC 7.1 04/20/2015   HCT 29.2* 04/20/2015   PLT 250 04/20/2015   GLUCOSE 84 04/20/2015   CHOL 206* 05/08/2015   TRIG 92 05/08/2015   HDL 75 05/08/2015   LDLCALC 113* 05/08/2015   ALT 13 04/20/2015   AST 22 04/20/2015   NA 140 04/20/2015   K 3.8 04/20/2015   CL 99 04/20/2015   CREATININE 0.78 04/20/2015   BUN 13 04/20/2015   CO2 27 04/20/2015   TSH 2.520 07/23/2015    Patient was never admitted.  Assessment & Plan:   Anglique was seen today for hypertension, constipation, hemorrhoids and sore tongue.  Diagnoses and all orders for this visit:  Urinary frequency -     CBC with Differential/Platelet -     Urinalysis -     Urine culture  Essential hypertension -     CBC with Differential/Platelet  Atrophic glossitis -     CBC with Differential/Platelet -     Vitamin B12 -     Folate  Anxiety  Other orders -     linaclotide (LINZESS) 145 MCG CAPS capsule; Take 1 capsule (145 mcg total) by mouth daily. To regulate bowel movements    Medication List       This list is accurate as of: 10/05/15  4:00 PM.  Always use your most recent med list.               aspirin EC 81 MG tablet  Take 81 mg by mouth daily.     BENEFIBER DRINK MIX PO  Take 1 Dose by mouth 3 (three) times daily.     docusate sodium 100 MG capsule  Commonly known as:  COLACE  Take 100 mg by mouth.     escitalopram 10 MG tablet  Commonly known as:   LEXAPRO  Take 0.5 tablets (5 mg total) by mouth daily.     hydrochlorothiazide 25 MG tablet  Commonly known as:  HYDRODIURIL  Reported on 10/05/2015     linaclotide 145 MCG Caps capsule  Commonly known as:  LINZESS  Take 1 capsule (145 mcg total) by mouth daily. To regulate bowel movements     Omega-3 1000 MG Caps  Take 1 g by mouth daily.     omeprazole 20 MG capsule  Commonly known as:  PRILOSEC  Take 1 capsule (20 mg total) by mouth daily.     Psyllium 28.3 % Powd  Commonly  known as:  METAMUCIL  2 tableyspoons po twice dail     simvastatin 40 MG tablet  Commonly known as:  ZOCOR  Take 1 tablet (40 mg total) by mouth daily.     Vitamin D (Cholecalciferol) 1000 units Caps  Take 2,000 Units by mouth daily.     zolpidem 10 MG tablet  Commonly known as:  AMBIEN  Take 1 tablet (10 mg total) by mouth at bedtime as needed.       I have discontinued Ms. Blanks's ferrous sulfate. I am also having her start on linaclotide. Additionally, I am having her maintain her aspirin EC, Vitamin D (Cholecalciferol), docusate sodium, Omega-3, omeprazole, simvastatin, Wheat Dextrin (BENEFIBER DRINK MIX PO), zolpidem, escitalopram, and hydrochlorothiazide.  Meds ordered this encounter  Medications  . linaclotide (LINZESS) 145 MCG CAPS capsule    Sig: Take 1 capsule (145 mcg total) by mouth daily. To regulate bowel movements    Dispense:  30 capsule    Refill:  5      Follow-up: Return in about 3 months (around 01/05/2016).  Claretta Fraise, M.D.

## 2015-10-06 LAB — CBC WITH DIFFERENTIAL/PLATELET
BASOS: 1 %
Basophils Absolute: 0 10*3/uL (ref 0.0–0.2)
EOS (ABSOLUTE): 0.1 10*3/uL (ref 0.0–0.4)
EOS: 2 %
Hematocrit: 33.9 % — ABNORMAL LOW (ref 34.0–46.6)
Hemoglobin: 11.4 g/dL (ref 11.1–15.9)
IMMATURE GRANS (ABS): 0 10*3/uL (ref 0.0–0.1)
IMMATURE GRANULOCYTES: 0 %
LYMPHS ABS: 1.8 10*3/uL (ref 0.7–3.1)
Lymphs: 30 %
MCH: 31.5 pg (ref 26.6–33.0)
MCHC: 33.6 g/dL (ref 31.5–35.7)
MCV: 94 fL (ref 79–97)
MONOS ABS: 0.5 10*3/uL (ref 0.1–0.9)
Monocytes: 9 %
Neutrophils Absolute: 3.4 10*3/uL (ref 1.4–7.0)
Neutrophils: 58 %
PLATELETS: 264 10*3/uL (ref 150–379)
RBC: 3.62 x10E6/uL — AB (ref 3.77–5.28)
RDW: 13.5 % (ref 12.3–15.4)
WBC: 5.8 10*3/uL (ref 3.4–10.8)

## 2015-10-06 LAB — VITAMIN B12: VITAMIN B 12: 339 pg/mL (ref 211–946)

## 2015-10-06 LAB — URINE CULTURE

## 2015-10-06 LAB — FOLATE: Folate: 10.8 ng/mL (ref >3.0)

## 2015-10-14 DIAGNOSIS — D485 Neoplasm of uncertain behavior of skin: Secondary | ICD-10-CM | POA: Diagnosis not present

## 2015-10-14 DIAGNOSIS — L280 Lichen simplex chronicus: Secondary | ICD-10-CM | POA: Diagnosis not present

## 2015-10-14 DIAGNOSIS — L570 Actinic keratosis: Secondary | ICD-10-CM | POA: Diagnosis not present

## 2015-10-14 DIAGNOSIS — L821 Other seborrheic keratosis: Secondary | ICD-10-CM | POA: Diagnosis not present

## 2015-10-14 DIAGNOSIS — L853 Xerosis cutis: Secondary | ICD-10-CM | POA: Diagnosis not present

## 2015-11-05 ENCOUNTER — Encounter: Payer: Self-pay | Admitting: Pharmacist

## 2015-11-11 ENCOUNTER — Telehealth: Payer: Self-pay | Admitting: Pharmacist

## 2015-11-11 ENCOUNTER — Telehealth: Payer: Self-pay | Admitting: Family Medicine

## 2015-11-11 MED ORDER — FLUCONAZOLE 100 MG PO TABS: 100.0000 mg | ORAL_TABLET | Freq: Every day | ORAL | Status: DC

## 2015-11-11 NOTE — Telephone Encounter (Signed)
Patient is due for Prolia injection around 11/16/2015.  Insurance verification shows she would be responsible for 20% or about $170.   Tried to call patient to verify appt and that she still wanted to get Prolia - no answer - LM to call office.

## 2015-11-11 NOTE — Telephone Encounter (Signed)
The requested med has been sent to the pharmacy.  Please let the patient know. Thanks, WS 

## 2015-11-11 NOTE — Telephone Encounter (Signed)
Pt aware Rx sent to pharmacy 

## 2015-11-11 NOTE — Telephone Encounter (Signed)
Patient wants to know if she can get a rx for diflucan to see if it will clear her mouth up.

## 2015-11-13 ENCOUNTER — Telehealth: Payer: Self-pay | Admitting: Family Medicine

## 2015-11-13 ENCOUNTER — Telehealth: Payer: Self-pay | Admitting: Pharmacist

## 2015-11-13 MED ORDER — ZOLPIDEM TARTRATE 10 MG PO TABS: 10.0000 mg | ORAL_TABLET | Freq: Every evening | ORAL | Status: DC | PRN

## 2015-11-16 ENCOUNTER — Encounter: Payer: Self-pay | Admitting: Family Medicine

## 2015-11-16 NOTE — Telephone Encounter (Signed)
appt for administration tomorrow at 11am. Patient is ok with 20% cost.

## 2015-11-16 NOTE — Telephone Encounter (Signed)
See call from 11/09/2015

## 2015-11-17 ENCOUNTER — Ambulatory Visit (INDEPENDENT_AMBULATORY_CARE_PROVIDER_SITE_OTHER): Payer: Medicare Other | Admitting: Pharmacist

## 2015-11-17 ENCOUNTER — Encounter: Payer: Self-pay | Admitting: Pharmacist

## 2015-11-17 VITALS — BP 104/68 | HR 60 | Ht 59.0 in | Wt 106.0 lb

## 2015-11-17 DIAGNOSIS — Z1159 Encounter for screening for other viral diseases: Secondary | ICD-10-CM | POA: Diagnosis not present

## 2015-11-17 DIAGNOSIS — Z0000 Encounter for general adult medical examination without abnormal findings: Secondary | ICD-10-CM | POA: Diagnosis not present

## 2015-11-17 DIAGNOSIS — M8580 Other specified disorders of bone density and structure, unspecified site: Secondary | ICD-10-CM

## 2015-11-17 MED ORDER — DENOSUMAB 60 MG/ML ~~LOC~~ SOLN
60.0000 mg | Freq: Once | SUBCUTANEOUS | Status: AC
  Administered 2015-11-17: 60 mg via SUBCUTANEOUS

## 2015-11-17 NOTE — Progress Notes (Addendum)
Patient ID: Karina Clark, female   DOB: [birth year], 70 y.o.   MRN: [id number]    Subjective:   Karina Clark is a 70 y.o. female who presents for a subsequent Medicare Annual Wellness Visit and for Prolia injection today.   Review of Systems  Review of Systems  Constitutional: Negative.   HENT: Negative.   Eyes: Negative.   Respiratory: Negative.   Cardiovascular: Negative.   Gastrointestinal: Negative.   Genitourinary: Negative.   Musculoskeletal: Negative.   Skin: Negative.   Neurological: Negative.   Endo/Heme/Allergies: Negative.   Psychiatric/Behavioral: Negative.      Current Medications (verified) Outpatient Encounter Prescriptions as of 11/17/2015  Medication Sig  . aspirin EC 81 MG tablet Take 81 mg by mouth daily.  Marland Kitchen docusate sodium (COLACE) 100 MG capsule Take 200 mg by mouth daily as needed.   Marland Kitchen escitalopram (LEXAPRO) 10 MG tablet Take 0.5 tablets (5 mg total) by mouth daily.  . fluconazole (DIFLUCAN) 100 MG tablet Take 1 tablet (100 mg total) by mouth daily.  . Omega-3 1000 MG CAPS Take 3 g by mouth daily.   Marland Kitchen omeprazole (PRILOSEC) 20 MG capsule Take 1 capsule (20 mg total) by mouth daily.  . Vitamin D, Cholecalciferol, 1000 UNITS CAPS Take 2,000 Units by mouth daily.   Marland Kitchen zolpidem (AMBIEN) 10 MG tablet Take 1 tablet (10 mg total) by mouth at bedtime as needed. (Patient taking differently: Take 5 mg by mouth at bedtime as needed. )  . Psyllium (METAMUCIL) 28.3 % POWD 2 tableyspoons po twice dail  . simvastatin (ZOCOR) 40 MG tablet Take 1 tablet (40 mg total) by mouth daily.  . [DISCONTINUED] hydrochlorothiazide (HYDRODIURIL) 25 MG tablet Reported on 11/17/2015  . [DISCONTINUED] linaclotide (LINZESS) 145 MCG CAPS capsule Take 1 capsule (145 mcg total) by mouth daily. To regulate bowel movements (Patient not taking: Reported on 11/17/2015)  . [DISCONTINUED] Wheat Dextrin (BENEFIBER DRINK MIX PO) Take 1 Dose by mouth 3 (three) times daily. Reported on 11/17/2015  .  [EXPIRED] denosumab (PROLIA) injection 60 mg    No facility-administered encounter medications on file as of 11/17/2015.    Allergies (verified) Mycelex; Naproxen; and Sulfamethoxazole-trimethoprim   History: Past Medical History  Diagnosis Date  . Hyperlipidemia   . Hypertension   . Anxiety   . Substance abuse     DCed cocaine in 1998  . Cataract   . Osteopenia   . Depression   . GERD (gastroesophageal reflux disease)    Past Surgical History  Procedure Laterality Date  . Tubal ligation    . Eye surgery    . Nasal septum surgery     Family History  Problem Relation Age of Onset  . Hyperlipidemia Mother   . Thyroid disease Mother   . Hip fracture Mother   . Hypertension Father   . Dementia Father   . Cancer Brother     spine  . Neuropathy Brother    Social History   Occupational History  . Not on file.   Social History Main Topics  . Smoking status: Former Smoker -- 0.50 packs/day    Types: Cigarettes    Start date: 03/24/1969    Quit date: 02/05/1995  . Smokeless tobacco: Never Used  . Alcohol Use: No  . Drug Use: No  . Sexual Activity: No    Do you feel safe at home?  Yes Are there smokers in your home (other than you)? No  Dietary issues and exercise activities: Current Exercise Habits:  The patient does not participate in regular exercise at present  Current Dietary habits:  Eats food rich in calcium;  Tries to limit salt intake.    Objective:    Today's Vitals   11/17/15 1137  BP: 104/68  Pulse: 60  Height: 4\' 11"  (1.499 m)  Weight: 106 lb (48.081 kg)  PainSc: 0-No pain   Body mass index is 21.4 kg/(m^2).   DEXA Results Date of Test T-Score for AP Spine L1-L4 T-Score for Neck of Left Hip T-Score for Total Left Hip T-Score for Total Right Hip  05/08/2015 -2.0 -1.9 -1.7 -1.1        04/29/2013 (from [facility]) -2.1 -1.9    03/14/2011 -1.9 -1.0     FRAX 10 year estimate: Total FX risk: 18% (consider medication  if >/= 20%) Hip FX risk: 3.5% (consider medication if >/= 3%)        Activities of Daily Living In your present state of health, do you have any difficulty performing the following activities: 11/17/2015  Hearing? N  Vision? N  Difficulty concentrating or making decisions? N  Walking or climbing stairs? N  Dressing or bathing? N  Doing errands, shopping? N  Preparing Food and eating ? N  Using the Toilet? N  In the past six months, have you accidently leaked urine? N  Do you have problems with loss of bowel control? Y  Managing your Medications? N  Managing your Finances? N  Housekeeping or managing your Housekeeping? N     Cardiac Risk Factors include: advanced age (>72men, >24 women);dyslipidemia;hypertension;sedentary lifestyle  Depression Screen PHQ 2/9 Scores 11/17/2015 09/04/2015 05/18/2015 04/20/2015  PHQ - 2 Score 0 0 1 0     Fall Risk Fall Risk  11/17/2015 09/04/2015 05/18/2015 04/20/2015 04/17/2015  Falls in the past year? No No No No No    Cognitive Function: MMSE - Mini Mental State Exam 11/17/2015  Orientation to time 5  Orientation to Place 5  Registration 3  Attention/ Calculation 5  Recall 3  Language- name 2 objects 2  Language- repeat 1  Language- follow 3 step command 3  Language- read & follow direction 1  Write a sentence 1  Copy design 1  Total score 30    Immunizations and Health Maintenance Immunization History  Administered Date(s) Administered  . Influenza,inj,Quad PF,36+ Mos 03/05/2015  . Pneumococcal Conjugate-13 11/28/2013  . Pneumococcal Polysaccharide-23 05/09/2012  . Zoster 01/07/2008   Health Maintenance Due  Topic Date Due  . Samul Dada  06/28/2015    Patient Care Team: Claretta Fraise, MD as PCP - General (Family Medicine) Jyl Heinz, MD as Consulting Physician (Endocrinology)   Michel Harrow - Chiropractor  Indicate any recent Medical Services you may have received from other than [facility] providers in the  past year (date may be approximate).    Assessment:    Annual Wellness Visit  Osteopenia with high fracture risk    Screening Tests Health Maintenance  Topic Date Due  . TETANUS/TDAP  06/28/2015  . Hepatitis C Screening  01/14/2016 (Originally 06-11-1945)  . INFLUENZA VACCINE  01/05/2016  . MAMMOGRAM  02/01/2017  . DEXA SCAN  05/07/2017  . COLONOSCOPY  07/12/2023  . ZOSTAVAX  Completed  . PNA vac Low Risk Adult  Completed        Plan:   During the course of the visit Karina Clark was educated and counseled about the following appropriate screening and preventive services:   Vaccines to include Pneumoccal, Influenza, Td, Zostavax - vaccines  are UTD except Tdap - patient to wait due to cost  Colorectal cancer screening - UTD  Cardiovascular disease screening - no recnet EKG - consider at next PCP visit  Patient taking statin - last Lipid panel 05/2015  Diabetes screening - UTD  Bone Denisty / Osteoporosis Screening - UTD  Prolia 60mg  administer SQ in office today  Mammogram - UTD  PAP - no longer required  Glaucoma screening / Diabetic Eye Exam - UTD  Patient to continue calcium intake from diet from low fat dairy products.  Limit salt intake.   Recommend try to decrease omeprazole to qod for 2 weeks, if able then D/C  Advanced Directives - pt declined  Physical Activity - recommended increase physical activity goal is 150 minutes per week  Orders Placed This Encounter  Procedures  . Hepatitis C antibody    Patient Instructions (the written plan) were given to the patient.   Cherre Robins, [facility]   11/18/2015    I have reviewed and agree with the above AWV documentation.  Claretta Fraise, M.D.

## 2015-11-17 NOTE — Patient Instructions (Addendum)
Karina Clark , Thank you for taking time to come for your Medicare Wellness Visit. I appreciate your ongoing commitment to your health goals. Please review the following plan we discussed and let me know if I can assist you in the future.   These are the goals we discussed:  Continue to eat / drink calcium rich foods such as almond milk and yogurt.  Try to increase exercise - goal is 150 minutes per week. Continue low fat / low salt diet    This is a list of the screening recommended for you and due dates:  Health Maintenance  Topic Date Due  . Tetanus Vaccine  06/28/2015 - cost is $45 - will postpone   .  Hepatitis C: One time screening is recommended by Center for Disease Control  (CDC) for  adults born from [birth year] through 1964.   Done today  . Pneumonia vaccines (2 of 2 - PPSV23) 01/14/2016*  . Flu Shot  01/05/2016  . Mammogram  02/01/2017  . Colon Cancer Screening  07/12/2023  . DEXA scan (bone density measurement)  Completed  . Shingles Vaccine  Completed  *Topic was postponed. The date shown is not the original due date.    Calcium & Vitamin D: The Facts  Why is calcium and vitamin D consumption important? Calcium: . Most Americans do not consume adequate amounts of calcium! Calcium is required for proper muscle function, nerve communication, bone support, and many other functions in the body.  . The body uses bones as a source of calcium. Bones 'remodel' themselves continuously - the body constantly breaks bone down to release calcium and rebuilds bones by replacing calcium in the bone later.  . As we get older, the rate of bone breakdown occurs faster than bone rebuilding which could lead to osteopenia, osteoporosis, and possible fractures.   Vitamin D: . People naturally make vitamin D in the body when sunlight hits the skin and triggers a process that leads to vitamin D production. This natural vitamin D production requires about 10-15 minutes of sun exposure on the hands,  arms, and face at least 2-3 times per week. However, due to decreased sun exposure and the use of sunscreen, most people will need to get additional vitamin D from foods or supplements. Your doctor can measure your body's vitamin D level through a simple blood test to determine your daily vitamin D needs.  . Vitamin D is used to help the body absorb calcium, maintain bone health, help the immune system, and reduce inflammation. It also plays a role in muscle performance, balance and risk of falling.  . Vitamin D deficiency can lead to osteomalacia or softening of the bones, bone pain, and muscle weakness.   The recommended daily allowance of Calcium and Vitamin D varies for different age groups. Age group Calcium (mg) Vitamin D (IU)  Females and Males: Age 63-50 1000 mg 600 IU  Females: Age 63- 47 1200 mg 600 IU  Males: Age 47-70 1000 mg 600 IU  Females and Males: Age 55+ 1200 mg 800 IU  Pregnant/lactating Females age 69-50 1000 mg 600 IU   How much Calcium do you get in your diet? Calcium Intake # of servings per day  Total calcium (mg)  Skim milk, 2% milk (1 cup) _________ x 300 mg   Yogurt (1 small container) _________ x 200 mg   Cheese (1oz) _________ x 200 mg   Cottage Cheese (1 cup)  _______ x 150 mg   Almond milk (1 cup) _________ x 450 mg   Fortified Orange Juice (1 cup) _________ x 300 mg   Broccoli or spinach ( 1 cup) _________ x 100 mg   Salmon (3 oz) _________ x 150 mg    Almonds (1/4 cup) _______ x 90 mg      How do we get Calcium and Vitamin D in our diet? Calcium: . Obtaining calcium from the diet is the most preferred way to reach the recommended daily goal. If this goal is not reached through diet, calcium supplements are available.  . Calcium is found in many foods including: dairy products, dark leafy vegetables (like broccoli, kale, and spinach), fish, and fortified products like juices and cereals.  . The food label will have a %DV (percent daily value)  listed showing the amount of calcium per serving. To determine the total mg per serving, simply replace the % with zero (0).  For example, Almond Breeze almond milk contains 45% DV of calcium or 450mg  per 1 cup.  . You can increase the amount of calcium in your diet by using more calcium products in your daily meals. Use yogurt and fruit to make smoothies or use yogurt to top baked potatoes or make whipped potatoes. Sprinkle low fat cheese onto salads or into egg white omelets. You can even add non-fat dry milk powder (300mg  calcium per 1/3 cup) to hot cereals, meat loaf, soups, or potatoes.  . Calcium supplements come in many forms including tablets, chewables, and gummies. Be sure to read the label to determine the correct number of tablets per serving and whether or not to take the supplement with food.  . Calcium carbonate products (Oscal, Caltrate, and Viactiv) are generally better absorbed when taken with food while calcium citrate products like Citracal can be taken with or without food.  . The body can only absorb about 600 mg of calcium at one time. It is recommended to take calcium supplements in small amounts several times per day.  However, taking it all at once is better than not taking it at all. . Increasing your intake of calcium is essential for bone health, but may also lead to some side effects like constipation, increased gas, bloating or abdominal cramping. To help reduce these side effects, start with 1 tablet per day and slowly increase your intake of the supplement to the recommended doses. It is also recommended that you drink plenty of water each day. Vitamin D: . Very few foods naturally contain vitamin D. However, it is found in saltwater fish (like tuna, salmon and mackerel), beef liver, egg yolks, cheese and vitamin D fortified foods (like yogurt, cereals, orange juice and milk) . The amount of vitamin D in each food or product is listed as %DV on the product label. To  determine the total amount of vitamin D per serving, drop the % sign and multiply the number by 4. For example, 1 cup of Almond Breeze almond milk contains 25% DV vitamin D or 100 IU per serving (25 x 4 =100). . Vitamin D is also found in multivitamins and supplements and may be listed as ergocalciferol (vitamin D2) or cholecalciferol (vitamin D3). Each of these forms of vitamin D are equivalent and the daily recommended intake will vary based on your age and the vitamin D levels in your body. Follow your doctor's recommendation for vitamin D intake.

## 2015-11-18 ENCOUNTER — Encounter: Payer: Self-pay | Admitting: Pharmacist

## 2015-11-18 LAB — HEPATITIS C ANTIBODY: Hep C Virus Ab: <0.1 s/co ratio (ref 0.0–0.9)

## 2015-11-18 NOTE — Addendum Note (Signed)
Addended by: Claretta Fraise on: 11/18/2015 12:09 PM   Modules accepted: Miquel Dunn

## 2015-11-21 ENCOUNTER — Encounter: Payer: Self-pay | Admitting: Family Medicine

## 2015-12-16 ENCOUNTER — Encounter: Payer: Self-pay | Admitting: Family Medicine

## 2015-12-18 ENCOUNTER — Encounter: Payer: Self-pay | Admitting: Family Medicine

## 2015-12-18 MED ORDER — FLUCONAZOLE 100 MG PO TABS: 100.0000 mg | ORAL_TABLET | Freq: Every day | ORAL | Status: DC

## 2016-01-04 ENCOUNTER — Encounter: Payer: Self-pay | Admitting: Pediatrics

## 2016-01-04 ENCOUNTER — Ambulatory Visit (INDEPENDENT_AMBULATORY_CARE_PROVIDER_SITE_OTHER): Payer: Medicare Other | Admitting: Pediatrics

## 2016-01-04 VITALS — BP 135/72 | HR 76 | Temp 101.0°F | Ht 59.0 in | Wt 104.6 lb

## 2016-01-04 DIAGNOSIS — R350 Frequency of micturition: Secondary | ICD-10-CM | POA: Diagnosis not present

## 2016-01-04 DIAGNOSIS — R509 Fever, unspecified: Secondary | ICD-10-CM

## 2016-01-04 LAB — URINALYSIS
BILIRUBIN UA: NEGATIVE
Glucose, UA: NEGATIVE
KETONES UA: NEGATIVE
LEUKOCYTES UA: NEGATIVE
NITRITE UA: NEGATIVE
Protein, UA: NEGATIVE
SPEC GRAV UA: 1.015 (ref 1.005–1.030)
Urobilinogen, Ur: 0.2 mg/dL (ref 0.2–1.0)
pH, UA: 8.5 — ABNORMAL HIGH (ref 5.0–7.5)

## 2016-01-04 NOTE — Progress Notes (Signed)
    Subjective:    Patient ID: Karina Clark, female    DOB: Oct 01, 1945, 70 y.o.   MRN: [id number]  CC: Fever; Generalized Body Aches; and Urinary Frequency   HPI: Karina Clark is a 70 y.o. 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     Depression screen [facility] 2/9 01/04/2016 11/17/2015 09/04/2015 05/18/2015 04/20/2015  Decreased Interest 0 0 0 0 0  Down, Depressed, Hopeless 0 0 0 1 0  PHQ - 2 Score 0 0 0 1 0     Relevant past medical, surgical, family and social history reviewed and updated. Interim medical history since our last visit reviewed. Allergies and medications reviewed and updated.  History  Smoking Status  . Former Smoker  . Packs/day: 0.50  . Types: Cigarettes  . Start date: 03/24/1969  . Quit date: 02/05/1995  Smokeless Tobacco  . Never Used    ROS: Per HPI      Objective:    BP 135/72 (BP Location: Left Arm, Patient Position: Sitting, Cuff Size: Normal)   Pulse 76   Temp (!) 101 F (38.3 C) (Oral)   Ht '4\' 11"'$  (1.499 m)   Wt 104 lb 9.6 oz (47.4 kg)   BMI 21.13 kg/m   Wt Readings from Last 3 Encounters:  01/04/16 104 lb 9.6 oz (47.4 kg)  11/17/15 106 lb (48.1 kg)  10/05/15 106 lb (48.1 kg)     Gen: NAD, alert, cooperative with exam, NCAT, warm EYES: EOMI, no scleral injection or icterus ENT:  TMs dull  gray b/l, OP without erythema LYMPH: no cervical LAD CV: NRRR, normal S1/S2, no murmur, distal pulses 2+ b/l Resp: CTABL, no wheezes, normal WOB Abd: +BS, soft, NTND. no guarding or organomegaly Ext: No edema, warm Neuro: Alert and oriented, strength equal b/l UE and LE, coordination grossly normal MSK: normal muscle bulk     Assessment & Plan:    Marne was seen today for fever, generalized body aches and urinary frequency. UA negative, given urinary symptoms will still send for culture. Will check CMP, CBC as well. Non-focal exam. No other localizing symptoms. CXR if fever continues, not able to get in afterhours clinic now.  Diagnoses and all orders for this visit:  Frequent urination -     Urinalysis  Fever, unspecified -     CMP14+EGFR -     CBC with Differential/Platelet -     Urine culture   Follow up plan: Tomorrow as scheduled with PCP  Assunta Found, MD [facility] 01/04/2016, 6:33 PM

## 2016-01-05 ENCOUNTER — Ambulatory Visit (INDEPENDENT_AMBULATORY_CARE_PROVIDER_SITE_OTHER): Payer: Medicare Other | Admitting: Family Medicine

## 2016-01-05 ENCOUNTER — Encounter: Payer: Self-pay | Admitting: Family Medicine

## 2016-01-05 VITALS — BP 111/64 | HR 64 | Temp 97.4°F | Ht 59.0 in | Wt 104.0 lb

## 2016-01-05 DIAGNOSIS — F419 Anxiety disorder, unspecified: Secondary | ICD-10-CM

## 2016-01-05 DIAGNOSIS — R509 Fever, unspecified: Secondary | ICD-10-CM

## 2016-01-05 DIAGNOSIS — I10 Essential (primary) hypertension: Secondary | ICD-10-CM

## 2016-01-05 DIAGNOSIS — E559 Vitamin D deficiency, unspecified: Secondary | ICD-10-CM

## 2016-01-05 DIAGNOSIS — G4700 Insomnia, unspecified: Secondary | ICD-10-CM | POA: Diagnosis not present

## 2016-01-05 DIAGNOSIS — E785 Hyperlipidemia, unspecified: Secondary | ICD-10-CM | POA: Diagnosis not present

## 2016-01-05 MED ORDER — OMEPRAZOLE 20 MG PO CPDR: 20.0000 mg | DELAYED_RELEASE_CAPSULE | Freq: Every day | ORAL | 3 refills | Status: DC

## 2016-01-05 MED ORDER — ESCITALOPRAM OXALATE 10 MG PO TABS: 5.0000 mg | ORAL_TABLET | Freq: Every day | ORAL | 2 refills | Status: DC

## 2016-01-05 MED ORDER — SIMVASTATIN 40 MG PO TABS: 40.0000 mg | ORAL_TABLET | Freq: Every day | ORAL | 4 refills | Status: DC

## 2016-01-05 MED ORDER — ZOLPIDEM TARTRATE 10 MG PO TABS: 5.0000 mg | ORAL_TABLET | Freq: Every evening | ORAL | 2 refills | Status: DC | PRN

## 2016-01-05 NOTE — Progress Notes (Signed)
Subjective:  Patient ID: Karina Clark, female    DOB: April 29, 1946  Age: 70 y.o. MRN: [id number]  CC: Hypertension (3 mth rck); Hyperlipidemia; Gastroesophageal Reflux; and Constipation   HPI Uyen Shelly presents for  follow-up of hypertension. Patient has no history of headache chest pain or shortness of breath or recent cough. Patient also denies symptoms of TIA such as numbness weakness lateralizing. Patient checks  blood pressure at home and has not had any elevated readings recently. Patient denies side effects from his medication. States taking it regularly.  Patient also  in for follow-up of elevated cholesterol. Doing well without complaints on current medication. Denies side effects of statin including myalgia and arthralgia and nausea. Also in today for liver function testing. Currently no chest pain, shortness of breath or other cardiovascular related symptoms noted.  Patient in for follow-up of GERD. Currently asymptomatic taking  PPI daily. There is no chest pain or heartburn. No hematemesis and no melena. No dysphagia or choking. Onset is remote. Progression is stable. Complicating factors, none.    History Justiss has a past medical history of Anxiety; Cataract; Depression; GERD (gastroesophageal reflux disease); Hyperlipidemia; Hypertension; Osteopenia; and Substance abuse.   She has a past surgical history that includes Tubal ligation; Eye surgery; and Nasal septum surgery.   Her family history includes Cancer in her brother; Dementia in her father; Hip fracture in her mother; Hyperlipidemia in her mother; Hypertension in her father; Neuropathy in her brother; Thyroid disease in her mother.She reports that she quit smoking about 20 years ago. Her smoking use included Cigarettes. She started smoking about 46 years ago. She smoked 0.50 packs per day. She has never used smokeless tobacco. She reports that she does not drink alcohol or use drugs.  Current Outpatient  Prescriptions on File Prior to Visit  Medication Sig Dispense Refill  . aspirin EC 81 MG tablet Take 81 mg by mouth daily.    Marland Kitchen docusate sodium (COLACE) 100 MG capsule Take 200 mg by mouth daily as needed.     . Omega-3 1000 MG CAPS Take 3 g by mouth daily.     . Psyllium (METAMUCIL) 28.3 % POWD 2 tableyspoons po twice dail 861 g 11  . Vitamin D, Cholecalciferol, 1000 UNITS CAPS Take 2,000 Units by mouth daily.      No current facility-administered medications on file prior to visit.     ROS Review of Systems  Constitutional: Positive for fever (101 yesterday. Seen in evning clinic). Negative for activity change and appetite change.  HENT: Negative for congestion, rhinorrhea and sore throat.   Eyes: Negative for visual disturbance.  Respiratory: Negative for cough and shortness of breath.   Cardiovascular: Negative for chest pain and palpitations.  Gastrointestinal: Negative for abdominal pain, diarrhea and nausea.  Genitourinary: Negative for dysuria.  Musculoskeletal: Positive for myalgias (yesterday only. None today). Negative for arthralgias.  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 Negative for agitation, decreased concentration and dysphoric mood.    Objective:  BP 111/64 (BP Location: Left Arm, Patient Position: Sitting, Cuff Size: Normal)   Pulse 64   Temp 97.4 F (36.3 C) (Oral)   Ht 4\' 11"  (1.499 m)   Wt 104 lb (47.2 kg)   SpO2 99%   BMI 21.01 kg/m   BP Readings from Last 3 Encounters:  01/05/16 111/64  01/04/16 135/72  11/17/15 104/68    Wt Readings from Last 3 Encounters:  01/05/16 104 lb (  47.2 kg)  01/04/16 104 lb 9.6 oz (47.4 kg)  11/17/15 106 lb (48.1 kg)     Physical Exam  Constitutional: She is oriented to person, place, and time. She appears well-developed and well-nourished. No distress.  HENT:  Head: Normocephalic and atraumatic.  Right Ear: External ear normal.  Left  Ear: External ear normal.  Nose: Nose normal.  Mouth/Throat: Oropharynx is clear and moist.  Eyes: Conjunctivae and EOM are normal. Pupils are equal, round, and reactive to light.  Neck: Normal range of motion. Neck supple. No thyromegaly present.  Cardiovascular: Normal rate, regular rhythm and normal heart sounds.   No murmur heard. Pulmonary/Chest: Effort normal and breath sounds normal. No respiratory distress. She has no wheezes. She has no rales.  Abdominal: Soft. Bowel sounds are normal. She exhibits no distension. There is no tenderness.  Lymphadenopathy:    She has no cervical adenopathy.  Neurological: She is alert and oriented to person, place, and time. She has normal reflexes.  Skin: Skin is warm and dry.  Psychiatric: She has a normal mood and affect. Her behavior is normal. Judgment and thought content normal.    No results found for: HGBA1C  Lab Results  Component Value Date   WBC 5.8 10/05/2015   HCT 33.9 (L) 10/05/2015   PLT 264 10/05/2015   GLUCOSE 84 04/20/2015   CHOL 206 (H) 05/08/2015   TRIG 92 05/08/2015   HDL 75 05/08/2015   LDLCALC 113 (H) 05/08/2015   ALT 13 04/20/2015   AST 22 04/20/2015   NA 140 04/20/2015   K 3.8 04/20/2015   CL 99 04/20/2015   CREATININE 0.78 04/20/2015   BUN 13 04/20/2015   CO2 27 04/20/2015   TSH 2.520 07/23/2015      Assessment & Plan:   Fadra was seen today for hypertension, hyperlipidemia, gastroesophageal reflux and constipation.  Diagnoses and all orders for this visit:  Fever, unspecified -     Sedimentation rate -     Human parvovirus DNA detection by PCR  Essential hypertension  Anxiety  Avitaminosis D -     VITAMIN D 25 Hydroxy (Vit-D Deficiency, Fractures)  Cannot sleep  HLD (hyperlipidemia) -     Lipid panel  Other orders -     zolpidem (AMBIEN) 10 MG tablet; Take 0.5 tablets (5 mg total) by mouth at bedtime as needed. -     simvastatin (ZOCOR) 40 MG tablet; Take 1 tablet (40 mg total) by mouth  daily. -     omeprazole (PRILOSEC) 20 MG capsule; Take 1 capsule (20 mg total) by mouth daily. -     escitalopram (LEXAPRO) 10 MG tablet; Take 0.5 tablets (5 mg total) by mouth daily.   I have changed Ms. Mcneese's zolpidem. I am also having her maintain her aspirin EC, Vitamin D (Cholecalciferol), docusate sodium, Omega-3, Psyllium, simvastatin, omeprazole, and escitalopram.  Meds ordered this encounter  Medications  . zolpidem (AMBIEN) 10 MG tablet    Sig: Take 0.5 tablets (5 mg total) by mouth at bedtime as needed.    Dispense:  30 tablet    Refill:  2  . simvastatin (ZOCOR) 40 MG tablet    Sig: Take 1 tablet (40 mg total) by mouth daily.    Dispense:  90 tablet    Refill:  4  . omeprazole (PRILOSEC) 20 MG capsule    Sig: Take 1 capsule (20 mg total) by mouth daily.    Dispense:  90 capsule    Refill:  3  . escitalopram (LEXAPRO) 10 MG tablet    Sig: Take 0.5 tablets (5 mg total) by mouth daily.    Dispense:  45 tablet    Refill:  2     Follow-up: Return in about 6 months (around 07/07/2016).  Claretta Fraise, M.D.

## 2016-01-06 LAB — CMP14+EGFR
A/G RATIO: 1.9 (ref 1.2–2.2)
ALT: 8 IU/L (ref 0–32)
AST: 17 IU/L (ref 0–40)
Albumin: 4.5 g/dL (ref 3.6–4.8)
Alkaline Phosphatase: 48 IU/L (ref 39–117)
BUN/Creatinine Ratio: 13 (ref 12–28)
BUN: 10 mg/dL (ref 8–27)
CHLORIDE: 99 mmol/L (ref 96–106)
CO2: 24 mmol/L (ref 18–29)
Calcium: 9.3 mg/dL (ref 8.7–10.3)
Creatinine, Ser: 0.75 mg/dL (ref 0.57–1.00)
GFR calc Af Amer: 95 mL/min/{1.73_m2} (ref >59)
GFR, EST NON AFRICAN AMERICAN: 82 mL/min/{1.73_m2} (ref >59)
GLUCOSE: 98 mg/dL (ref 65–99)
Globulin, Total: 2.4 g/dL (ref 1.5–4.5)
POTASSIUM: 4.1 mmol/L (ref 3.5–5.2)
Sodium: 138 mmol/L (ref 134–144)
Total Protein: 6.9 g/dL (ref 6.0–8.5)

## 2016-01-06 LAB — CBC WITH DIFFERENTIAL/PLATELET
BASOS ABS: 0 10*3/uL (ref 0.0–0.2)
Basos: 0 %
EOS (ABSOLUTE): 0 10*3/uL (ref 0.0–0.4)
Eos: 0 %
Hematocrit: 33.1 % — ABNORMAL LOW (ref 34.0–46.6)
Hemoglobin: 11.3 g/dL (ref 11.1–15.9)
IMMATURE GRANS (ABS): 0 10*3/uL (ref 0.0–0.1)
Immature Granulocytes: 0 %
LYMPHS: 20 %
Lymphocytes Absolute: 1.4 10*3/uL (ref 0.7–3.1)
MCH: 30.1 pg (ref 26.6–33.0)
MCHC: 34.1 g/dL (ref 31.5–35.7)
MCV: 88 fL (ref 79–97)
Monocytes Absolute: 0.7 10*3/uL (ref 0.1–0.9)
Monocytes: 10 %
NEUTROS PCT: 70 %
Neutrophils Absolute: 4.9 10*3/uL (ref 1.4–7.0)
PLATELETS: 270 10*3/uL (ref 150–379)
RBC: 3.76 x10E6/uL — ABNORMAL LOW (ref 3.77–5.28)
RDW: 12.6 % (ref 12.3–15.4)
WBC: 7 10*3/uL (ref 3.4–10.8)

## 2016-01-07 ENCOUNTER — Other Ambulatory Visit: Payer: Self-pay | Admitting: Pediatrics

## 2016-01-07 ENCOUNTER — Encounter: Payer: Self-pay | Admitting: Family Medicine

## 2016-01-07 DIAGNOSIS — N3000 Acute cystitis without hematuria: Secondary | ICD-10-CM

## 2016-01-07 LAB — URINE CULTURE

## 2016-01-07 MED ORDER — NITROFURANTOIN MONOHYD MACRO 100 MG PO CAPS: 100.0000 mg | ORAL_CAPSULE | Freq: Two times a day (BID) | ORAL | 0 refills | Status: DC

## 2016-01-07 MED ORDER — NITROFURANTOIN MONOHYD MACRO 100 MG PO CAPS: 100.0000 mg | ORAL_CAPSULE | Freq: Two times a day (BID) | ORAL | 0 refills | Status: AC

## 2016-01-08 ENCOUNTER — Encounter: Payer: Self-pay | Admitting: Family Medicine

## 2016-01-08 LAB — LIPID PANEL
CHOL/HDL RATIO: 2.6 ratio (ref 0.0–4.4)
Cholesterol, Total: 177 mg/dL (ref 100–199)
HDL: 69 mg/dL (ref >39)
LDL Calculated: 91 mg/dL (ref 0–99)
Triglycerides: 83 mg/dL (ref 0–149)
VLDL CHOLESTEROL CAL: 17 mg/dL (ref 5–40)

## 2016-01-08 LAB — SEDIMENTATION RATE: SED RATE: 14 mm/h (ref 0–40)

## 2016-01-08 LAB — HUMAN PARVOVIRUS DNA DETECTION BY PCR: Parvovirus B19, PCR: NEGATIVE

## 2016-01-08 LAB — VITAMIN D 25 HYDROXY (VIT D DEFICIENCY, FRACTURES): Vit D, 25-Hydroxy: 37 ng/mL (ref 30.0–100.0)

## 2016-01-11 ENCOUNTER — Telehealth: Payer: Self-pay

## 2016-01-11 NOTE — Telephone Encounter (Signed)
Pt notified of results Verbalizes understanding Labs printed and placed up front for pt pick up

## 2016-01-11 NOTE — Telephone Encounter (Signed)
Please let pt know--her Parvovirus PCR was negative, inflammation level normal (sedimentation rate), vitamin D level good, and cholesterol numbers looked good.  Unfortunately I am not attached to Dr. Livia Snellen' inbox so I can't release the labs themselves. Usually the results should show up in the inbox after 3 days whether they were released or not so I'm not sure why they aren't showing up yet in her inbox. We can mail her a letter with the results printed if she likes. Sorry for inconvenience.

## 2016-01-11 NOTE — Telephone Encounter (Signed)
Could you please review patients lab results and release them to her my chart? Patient called and wanting to know results. Covering PCP Please advise

## 2016-02-23 ENCOUNTER — Ambulatory Visit (INDEPENDENT_AMBULATORY_CARE_PROVIDER_SITE_OTHER): Payer: Medicare Other

## 2016-02-23 DIAGNOSIS — Z23 Encounter for immunization: Secondary | ICD-10-CM | POA: Diagnosis not present

## 2016-02-25 DIAGNOSIS — L0100 Impetigo, unspecified: Secondary | ICD-10-CM | POA: Diagnosis not present

## 2016-02-25 DIAGNOSIS — K130 Diseases of lips: Secondary | ICD-10-CM | POA: Diagnosis not present

## 2016-03-14 DIAGNOSIS — G44219 Episodic tension-type headache, not intractable: Secondary | ICD-10-CM | POA: Diagnosis not present

## 2016-03-17 ENCOUNTER — Encounter: Payer: Self-pay | Admitting: Pediatrics

## 2016-03-17 ENCOUNTER — Ambulatory Visit (INDEPENDENT_AMBULATORY_CARE_PROVIDER_SITE_OTHER): Payer: Medicare Other | Admitting: Pediatrics

## 2016-03-17 VITALS — BP 109/66 | HR 73 | Temp 99.7°F | Ht 59.0 in | Wt 105.0 lb

## 2016-03-17 DIAGNOSIS — R509 Fever, unspecified: Secondary | ICD-10-CM

## 2016-03-17 DIAGNOSIS — R110 Nausea: Secondary | ICD-10-CM | POA: Diagnosis not present

## 2016-03-17 DIAGNOSIS — N390 Urinary tract infection, site not specified: Secondary | ICD-10-CM | POA: Diagnosis not present

## 2016-03-17 DIAGNOSIS — R8281 Pyuria: Secondary | ICD-10-CM

## 2016-03-17 LAB — MICROSCOPIC EXAMINATION
Epithelial Cells (non renal): >10 /hpf — AB (ref 0–10)
RBC MICROSCOPIC, UA: NONE SEEN /HPF (ref 0–?)

## 2016-03-17 LAB — URINALYSIS, COMPLETE
Bilirubin, UA: NEGATIVE
GLUCOSE, UA: NEGATIVE
Leukocytes, UA: NEGATIVE
NITRITE UA: NEGATIVE
SPEC GRAV UA: 1.02 (ref 1.005–1.030)
UUROB: 1 mg/dL (ref 0.2–1.0)
pH, UA: 6.5 (ref 5.0–7.5)

## 2016-03-17 MED ORDER — CIPROFLOXACIN HCL 250 MG PO TABS: 250.0000 mg | ORAL_TABLET | Freq: Two times a day (BID) | ORAL | 0 refills | Status: DC

## 2016-03-17 NOTE — Progress Notes (Signed)
  Subjective:   Patient ID: Karina Clark, female    DOB: Dec 27, 1945, 70 y.o.   MRN: [id number] CC: Fever; Chills; and Nausea  HPI: Karina Clark is a 70 y.o. female presenting for Fever; Chills; and Nausea  Started feeling bad this morning, freezing Temp of 102 this morning Took two ibuprofen Feeling better since then  Is having some urinary frequency, also urgency One episode of pulsating pain in abd yesterday in the car  Appetite has been down for a few days No URI symptoms, no coughing or congestion   Relevant past medical, surgical, family and social history reviewed. Allergies and medications reviewed and updated. History  Smoking Status  . Former Smoker  . Packs/day: 0.50  . Types: Cigarettes  . Start date: 03/24/1969  . Quit date: 02/05/1995  Smokeless Tobacco  . Never Used   ROS: Per HPI   Objective:    BP 109/66   Pulse 73   Temp 99.7 F (37.6 C) (Oral)   Ht 4\' 11"  (1.499 m)   Wt 105 lb (47.6 kg)   BMI 21.21 kg/m   Wt Readings from Last 3 Encounters:  03/17/16 105 lb (47.6 kg)  01/05/16 104 lb (47.2 kg)  01/04/16 104 lb 9.6 oz (47.4 kg)    Gen: NAD, alert, cooperative with exam, NCAT, no congestion EYES: EOMI, no conjunctival injection, or no icterus ENT:  TMs pearly gray b/l, OP without erythema LYMPH: no cervical LAD CV: NRRR, normal S1/S2, no murmur, distal pulses 2+ b/l Resp: CTABL, no wheezes, normal WOB Abd: +BS, soft, mildly tender with palpation lower abd, ND. no guarding or organomegaly Ext: No edema, warm Neuro: Alert and oriented, strength equal b/l UE and LE, coordination grossly normal MSK: normal muscle bulk  Assessment & Plan:  Karina Clark was seen today for fever, chills and nausea. Isolated fever with UTI several months ago. Does have some mild abd pain, exam otherwise non-focal. Will send for culture. Treat for UTI. Return precautions discussed.  Diagnoses and all orders for this visit:  Fever, unspecified fever cause -      Urinalysis, Complete  Nausea without vomiting -     Urinalysis, Complete  Pyuria -     Urine culture -     ciprofloxacin (CIPRO) 250 MG tablet; Take 1 tablet (250 mg total) by mouth 2 (two) times daily.   Follow up plan: As needed Assunta Found, MD [facility]

## 2016-03-21 ENCOUNTER — Telehealth: Payer: Self-pay | Admitting: Family Medicine

## 2016-03-21 LAB — URINE CULTURE

## 2016-03-21 NOTE — Telephone Encounter (Signed)
Culture is growing bacteria but the final result isnt in yet so I am not sure what kind of bacteria, should be back later today or tomorrow. UTI is probably the cause of her symptoms. Will let her know when it is back.

## 2016-03-21 NOTE — Telephone Encounter (Signed)
Pt is wanting to know results of urine culture. She finishes abx today. She is still running low grade fever only in the evenings though. Please advise and route to pool

## 2016-03-22 NOTE — Telephone Encounter (Signed)
Patient aware of urine culture results. Patient states that she is no longer running a fever and will call back if symptoms return.

## 2016-04-04 ENCOUNTER — Other Ambulatory Visit: Payer: Self-pay | Admitting: Family Medicine

## 2016-04-18 DIAGNOSIS — L570 Actinic keratosis: Secondary | ICD-10-CM | POA: Diagnosis not present

## 2016-05-19 ENCOUNTER — Ambulatory Visit (INDEPENDENT_AMBULATORY_CARE_PROVIDER_SITE_OTHER): Payer: Medicare Other | Admitting: Pharmacist

## 2016-05-19 DIAGNOSIS — M8580 Other specified disorders of bone density and structure, unspecified site: Secondary | ICD-10-CM

## 2016-05-19 DIAGNOSIS — M899 Disorder of bone, unspecified: Secondary | ICD-10-CM | POA: Diagnosis not present

## 2016-05-19 MED ORDER — DENOSUMAB 60 MG/ML ~~LOC~~ SOLN
60.0000 mg | Freq: Once | SUBCUTANEOUS | Status: AC
  Administered 2016-05-19: 60 mg via SUBCUTANEOUS

## 2016-05-19 NOTE — Patient Instructions (Signed)
Fall Prevention in the Home Falls can cause injuries and can affect people from all age groups. There are many simple things that you can do to make your home safe and to help prevent falls. What can I do on the outside of my home?  Regularly repair the edges of walkways and driveways and fix any cracks.  Remove high doorway thresholds.  Trim any shrubbery on the main path into your home.  Use bright outdoor lighting.  Clear walkways of debris and clutter, including tools and rocks.  Regularly check that handrails are securely fastened and in good repair. Both sides of any steps should have handrails.  Install guardrails along the edges of any raised decks or porches.  Have leaves, snow, and ice cleared regularly.  Use sand or salt on walkways during winter months.  In the garage, clean up any spills right away, including grease or oil spills. What can I do in the bathroom?  Use night lights.  Install grab bars by the toilet and in the tub and shower. Do not use towel bars as grab bars.  Use non-skid mats or decals on the floor of the tub or shower.  If you need to sit down while you are in the shower, use a plastic, non-slip stool.  Keep the floor dry. Immediately clean up any water that spills on the floor.  Remove soap buildup in the tub or shower on a regular basis.  Attach bath mats securely with double-sided non-slip rug tape.  Remove throw rugs and other tripping hazards from the floor. What can I do in the bedroom?  Use night lights.  Make sure that a bedside light is easy to reach.  Do not use oversized bedding that drapes onto the floor.  Have a firm chair that has side arms to use for getting dressed.  Remove throw rugs and other tripping hazards from the floor. What can I do in the kitchen?  Clean up any spills right away.  Avoid walking on wet floors.  Place frequently used items in easy-to-reach places.  If you need to reach for something above  you, use a sturdy step stool that has a grab bar.  Keep electrical cables out of the way.  Do not use floor polish or wax that makes floors slippery. If you have to use wax, make sure that it is non-skid floor wax.  Remove throw rugs and other tripping hazards from the floor. What can I do in the stairways?  Do not leave any items on the stairs.  Make sure that there are handrails on both sides of the stairs. Fix handrails that are broken or loose. Make sure that handrails are as long as the stairways.  Check any carpeting to make sure that it is firmly attached to the stairs. Fix any carpet that is loose or worn.  Avoid having throw rugs at the top or bottom of stairways, or secure the rugs with carpet tape to prevent them from moving.  Make sure that you have a light switch at the top of the stairs and the bottom of the stairs. If you do not have them, have them installed. What are some other fall prevention tips?  Wear closed-toe shoes that fit well and support your feet. Wear shoes that have rubber soles or low heels.  When you use a stepladder, make sure that it is completely opened and that the sides are firmly locked. Have someone hold the ladder while you are using  it. Do not climb a closed stepladder.  Add color or contrast paint or tape to grab bars and handrails in your home. Place contrasting color strips on the first and last steps.  Use mobility aids as needed, such as canes, walkers, scooters, and crutches.  Turn on lights if it is dark. Replace any light bulbs that burn out.  Set up furniture so that there are clear paths. Keep the furniture in the same spot.  Fix any uneven floor surfaces.  Choose a carpet design that does not hide the edge of steps of a stairway.  Be aware of any and all pets.  Review your medicines with your healthcare provider. Some medicines can cause dizziness or changes in blood pressure, which increase your risk of falling. Talk with  your health care provider about other ways that you can decrease your risk of falls. This may include working with a physical therapist or trainer to improve your strength, balance, and endurance. This information is not intended to replace advice given to you by your health care provider. Make sure you discuss any questions you have with your health care provider. Document Released: 05/13/2002 Document Revised: 10/20/2015 Document Reviewed: 06/27/2014 Elsevier Interactive Patient Education  2017 [place].                 Exercise for Strong Bones  Exercise is important to build and maintain strong bones / bone density.  There are 2 types of exercises that are important to building and maintaining strong bones:  Weight- bearing and muscle-stregthening.  Weight-bearing Exercises  These exercises include activities that make you move against gravity while staying upright. Weight-bearing exercises can be high-impact or low-impact.  High-impact weight-bearing exercises help build bones and keep them strong. If you have broken a bone due to osteoporosis or are at risk of breaking a bone, you may need to avoid high-impact exercises. If you're not sure, you should check with your healthcare provider.  Examples of high-impact weight-bearing exercises are: Dancing  Doing high-impact aerobics  Hiking  Jogging/running  Jumping Rope  Stair climbing  Tennis  Low-impact weight-bearing exercises can also help keep bones strong and are a safe alternative if you cannot do high-impact exercises.   Examples of low-impact weight-bearing exercises are: Using elliptical training machines  Doing low-impact aerobics  Using stair-step machines  Fast walking on a treadmill or outside   Muscle-Strengthening Exercises These exercises include activities where you move your body, a weight or some other resistance against gravity. They are also known as resistance exercises and include: Lifting weights   Using elastic exercise bands  Using weight machines  Lifting your own body weight  Functional movements, such as standing and rising up on your toes  Yoga and Pilates can also improve strength, balance and flexibility. However, certain positions may not be safe for people with osteoporosis or those at increased risk of broken bones. For example, exercises that have you bend forward may increase the chance of breaking a bone in the spine.   Non-Impact Exercises There are other types of exercises that can help prevent falls.  Non-impact exercises can help you to improve balance, posture and how well you move in everyday activities. Some of these exercises include: Balance exercises that strengthen your legs and test your balance, such as Tai Chi, can decrease your risk of falls.  Posture exercises that improve your posture and reduce rounded or "sloping" shoulders can help you decrease the chance of breaking a bone,  especially in the spine.  Functional exercises that improve how well you move can help you with everyday activities and decrease your chance of falling and breaking a bone. For example, if you have trouble getting up from a chair or climbing stairs, you should do these activities as exercises.   **A physical therapist can teach you balance, posture and functional exercises. He/she can also help you learn which exercises are safe and appropriate for you.  [facility] has a physical therapy office in [place] in front of our office and referrals can be made for assessments and treatment as needed and strength and balance training.  If you would like to have an assessment with [facility] and our physical therapy team please let a nurse or provider know.

## 2016-05-19 NOTE — Progress Notes (Signed)
ID: Karina Clark, female   DOB: July 08, 1945, 70 y.o.   MRN: [id number]     HPI: Karina Clark is here today to follow up osteopenia with high fracture risk based on FRAX estimate.  She has received 2 previous Prolia injections and has tolerated well.  Last Prolia injection was 11/17/2015.   In the past she tried alendroante but this was discontinued by per past PCP due to continued decrease in BMD.  She was then changed to Reclast but she only received 1 dose (single dose received at [facility]/10/2013).   Back Pain?  No       Kyphosis?  Yes Prior fracture?  No                                                             PMH: Age at menopause:  Late 20's  Hysterectomy?  No Oophorectomy?  No HRT? Yes - Former.  Type/duration: took for about 3 years Steroid Use?  No Thyroid med?  No - but has a history of multinodular thyroid - sees endocrinologist  History of cancer?  No History of digestive disorders (ie Crohn's)?  Yes - chronic constipation Current or previous eating disorders?  No Last Vitamin D Result:  37.0 (01/05/2016) Last GFR Result:  82 (01/04/2016)   FH/SH: Family history of osteoporosis?  No Parent with history of hip fracture?  Yes  - mother Family history of breast cancer?  No Exercise?  No Smoking?  No Alcohol?  No    Calcium Assessment Calcium Intake  # of servings/day  Calcium mg  Milk (8 oz), almond 1  x  450  = 450mg   Yogurt (4 oz) 1 x  200 = 200mg   Cheese (1 oz) 0 x  200 = 0  Other Calcium sources   250mg   Ca supplement No - due to constipation = 0   Estimated calcium intake per day 900mg    DEXA Results Date of Test T-Score for L2 T-Score for AP Spine L1-L4 T-Score for Neck of Left Hip T-Score for Total Left Hip T-Score for Total Right Hip  05/08/2015 -2.7 -2.0 -1.9 -1.7 -1.1         04/29/2013 (from [facility])  -2.1 -1.9    03/14/2011  -1.9 -1.0     Fall Risk  05/19/2016 03/17/2016 01/05/2016 01/04/2016 11/17/2015  Falls in the past year? Yes No  No No No  Number falls in past yr: 2 or more - fell last week during snow and ice - - - -  Injury with Fall? No - - - -  Risk Factor Category  High Fall Risk - - - -  Risk for fall due to : History of fall(s) - - - -  Follow up Falls prevention discussed - - - -    FRAX 10 year estimate: Total FX risk:  18 (consider medication if >/= 20%) Hip FX risk:  4.3 (consider medication if >/= 3%)  Assessment: Osteopenia with high fracture risk   Recommendations: 1.  Prolia 60mg  given SQ in left abdomen today.   2.  Discussed how to increase calcium intake through diet since patient cannot take supplements due to constipation. She will try to increase the following- green leafy vegetables, salmon, kale, broccoli.  Goal is calcium 1200mg  daily through  supplementation or diet.  3.  recommend weight bearing exercise - 30 minutes at least 4 days per week.   4.  Counseled and educated about fall risk and prevention.  Recheck DEXA:  05/2017  Time spent counseling patient:  30 minutes

## 2016-05-24 ENCOUNTER — Other Ambulatory Visit: Payer: Self-pay | Admitting: *Deleted

## 2016-05-24 MED ORDER — ZOLPIDEM TARTRATE 10 MG PO TABS: 10.0000 mg | ORAL_TABLET | Freq: Every day | ORAL | 1 refills | Status: DC

## 2016-05-24 NOTE — Telephone Encounter (Signed)
Refill called to [facility]

## 2016-06-27 ENCOUNTER — Ambulatory Visit (INDEPENDENT_AMBULATORY_CARE_PROVIDER_SITE_OTHER): Payer: Medicare Other | Admitting: Family Medicine

## 2016-06-27 ENCOUNTER — Encounter: Payer: Self-pay | Admitting: Family Medicine

## 2016-06-27 VITALS — BP 120/65 | HR 62 | Temp 97.4°F | Ht 59.0 in | Wt 100.0 lb

## 2016-06-27 DIAGNOSIS — M778 Other enthesopathies, not elsewhere classified: Secondary | ICD-10-CM | POA: Diagnosis not present

## 2016-06-27 DIAGNOSIS — F32A Depression, unspecified: Secondary | ICD-10-CM | POA: Insufficient documentation

## 2016-06-27 DIAGNOSIS — F331 Major depressive disorder, recurrent, moderate: Secondary | ICD-10-CM

## 2016-06-27 DIAGNOSIS — F329 Major depressive disorder, single episode, unspecified: Secondary | ICD-10-CM | POA: Insufficient documentation

## 2016-06-27 DIAGNOSIS — M8580 Other specified disorders of bone density and structure, unspecified site: Secondary | ICD-10-CM

## 2016-06-27 DIAGNOSIS — I10 Essential (primary) hypertension: Secondary | ICD-10-CM | POA: Diagnosis not present

## 2016-06-27 DIAGNOSIS — E559 Vitamin D deficiency, unspecified: Secondary | ICD-10-CM | POA: Diagnosis not present

## 2016-06-27 DIAGNOSIS — M19022 Primary osteoarthritis, left elbow: Secondary | ICD-10-CM

## 2016-06-27 DIAGNOSIS — F419 Anxiety disorder, unspecified: Secondary | ICD-10-CM

## 2016-06-27 MED ORDER — ESCITALOPRAM OXALATE 10 MG PO TABS: 10.0000 mg | ORAL_TABLET | Freq: Every day | ORAL | 2 refills | Status: DC

## 2016-06-27 NOTE — Progress Notes (Signed)
Subjective:  Patient ID: Karina Clark, female    DOB: [birth year]  Age: 71 y.o. MRN: [id number]  CC: Hypertension (pt here today for routine follow up on her HTN)   HPI [name] presents for follow-up of hypertension. Patient has no history of headache chest pain or shortness of breath or recent cough. Patient also denies symptoms of TIA such as numbness weakness lateralizing. Patient checks  blood pressure at home and has not had any elevated readings recently. Patient denies side effects from his medication. States taking it regularly. Additionally patient is concerned about ongoing depression. She is feeling fatigued, distraught and dysphoric. Depression screen [facility] 2/9 06/27/2016 05/19/2016 03/17/2016  Decreased Interest 3 0 0  Down, Depressed, Hopeless 3 0 0  PHQ - 2 Score 6 0 0  Altered sleeping 3 - -  Tired, decreased energy 3 - -  Change in appetite 3 - -  Feeling bad or failure about yourself  2 - -  Trouble concentrating 3 - -  Moving slowly or fidgety/restless 0 - -  Suicidal thoughts 0 - -  PHQ-9 Score 20 - -     History Karina Clark has a past medical history of Anxiety; Cataract; Depression; GERD (gastroesophageal reflux disease); Hyperlipidemia; Hypertension; Osteopenia; and Substance abuse.   She has a past surgical history that includes Tubal ligation; Eye surgery; and Nasal septum surgery.   Her family history includes Cancer in her brother; Dementia in her father; Hip fracture in her mother; Hyperlipidemia in her mother; Hypertension in her father; Neuropathy in her brother; Thyroid disease in her mother.She reports that she quit smoking about 21 years ago. Her smoking use included Cigarettes. She started smoking about 47 years ago. She smoked 0.50 packs per day. She has never used smokeless tobacco. She reports that she does not drink alcohol or use drugs.    ROS Review of Systems  Constitutional: Negative for activity change, appetite change and fever.    HENT: Negative for congestion, rhinorrhea and sore throat.   Eyes: Negative for visual disturbance.  Respiratory: Negative for cough and shortness of breath.   Cardiovascular: Negative for chest pain and palpitations.  Gastrointestinal: Negative for abdominal pain, diarrhea and nausea.  Genitourinary: Negative for dysuria.  Musculoskeletal: Positive for arthralgias (right elbow pain). Negative for myalgias.    Objective:  BP 120/65   Pulse 62   Temp 97.4 F (36.3 C) (Oral)   Ht 4' 11"  (1.499 m)   Wt 100 lb (45.4 kg)   BMI 20.20 kg/m   BP Readings from Last 3 Encounters:  06/27/16 120/65  03/17/16 109/66  01/05/16 111/64    Wt Readings from Last 3 Encounters:  06/27/16 100 lb (45.4 kg)  03/17/16 105 lb (47.6 kg)  01/05/16 104 lb (47.2 kg)     Physical Exam  Constitutional: She is oriented to person, place, and time. She appears well-developed and well-nourished. No distress.  HENT:  Head: Normocephalic and atraumatic.  Right Ear: External ear normal.  Left Ear: External ear normal.  Nose: Nose normal.  Mouth/Throat: Oropharynx is clear and moist.  Eyes: Conjunctivae and EOM are normal. Pupils are equal, round, and reactive to light.  Neck: Normal range of motion. Neck supple. No thyromegaly present.  Cardiovascular: Normal rate, regular rhythm and normal heart sounds.   No murmur heard. Pulmonary/Chest: Effort normal and breath sounds normal. No respiratory distress. She has no wheezes. She has no rales.  Abdominal: Soft. Bowel sounds are normal. She exhibits no  distension. There is no tenderness.  Musculoskeletal: She exhibits tenderness (right elbow, extensor origin).  Lymphadenopathy:    She has no cervical adenopathy.  Neurological: She is alert and oriented to person, place, and time. She has normal reflexes.  Skin: Skin is warm and dry.  Psychiatric: She has a normal mood and affect. Her behavior is normal. Judgment and thought content normal.    Patient  was never admitted.  Assessment & Plan:   Millissa was seen today for hypertension.  Diagnoses and all orders for this visit:  Tendinitis of right elbow -     CBC with Differential/Platelet -     CMP14+EGFR  Osteoarthritis of left elbow, unspecified osteoarthritis type -     CBC with Differential/Platelet -     CMP14+EGFR  Essential hypertension -     CBC with Differential/Platelet -     CMP14+EGFR  Avitaminosis D -     CBC with Differential/Platelet -     CMP14+EGFR -     VITAMIN D 25 Hydroxy (Vit-D Deficiency, Fractures)  Osteopenia with high risk of fracture -     CBC with Differential/Platelet -     CMP14+EGFR  Anxiety  Moderate episode of recurrent major depressive disorder ([place])  Other orders -     escitalopram (LEXAPRO) 10 MG tablet; Take 1 tablet (10 mg total) by mouth daily.     I have discontinued Ms. Laroque's docusate sodium and Psyllium. I have also changed her escitalopram. Additionally, I am having her maintain her aspirin EC, Vitamin D (Cholecalciferol), Omega-3, simvastatin, omeprazole, ketoconazole, zolpidem, and Magnesium.  Allergies as of 06/27/2016      Reactions   Mycelex [clotrimazole] Other (See Comments)   Worsened GERD / nausea   Naproxen Other (See Comments)   "Makes my stomach burn"   Sulfamethoxazole-trimethoprim Nausea And Vomiting      Medication List       Accurate as of 06/27/16  9:56 PM. Always use your most recent med list.          aspirin EC 81 MG tablet Take 81 mg by mouth daily.   escitalopram 10 MG tablet Commonly known as:  LEXAPRO Take 1 tablet (10 mg total) by mouth daily.   ketoconazole 2 % cream Commonly known as:  NIZORAL Apply 1 application topically daily.   Magnesium 250 MG Tabs Take by mouth.   Omega-3 1000 MG Caps Take 3 g by mouth daily.   omeprazole 20 MG capsule Commonly known as:  PRILOSEC Take 1 capsule (20 mg total) by mouth daily.   simvastatin 40 MG tablet Commonly known as:   ZOCOR Take 1 tablet (40 mg total) by mouth daily.   Vitamin D (Cholecalciferol) 1000 units Caps Take 2,000 Units by mouth daily.   zolpidem 10 MG tablet Commonly known as:  AMBIEN Take 1 tablet (10 mg total) by mouth at bedtime.        Follow-up: Return in about 1 month (around 07/28/2016).  Claretta Fraise, M.D.

## 2016-06-28 LAB — CBC WITH DIFFERENTIAL/PLATELET
BASOS ABS: 0 10*3/uL (ref 0.0–0.2)
Basos: 0 %
EOS (ABSOLUTE): 0 10*3/uL (ref 0.0–0.4)
Eos: 0 %
Hematocrit: 34.8 % (ref 34.0–46.6)
Hemoglobin: 11.5 g/dL (ref 11.1–15.9)
IMMATURE GRANS (ABS): 0 10*3/uL (ref 0.0–0.1)
IMMATURE GRANULOCYTES: 0 %
LYMPHS: 26 %
Lymphocytes Absolute: 1.7 10*3/uL (ref 0.7–3.1)
MCH: 30.3 pg (ref 26.6–33.0)
MCHC: 33 g/dL (ref 31.5–35.7)
MCV: 92 fL (ref 79–97)
MONOS ABS: 0.4 10*3/uL (ref 0.1–0.9)
Monocytes: 6 %
NEUTROS PCT: 68 %
Neutrophils Absolute: 4.6 10*3/uL (ref 1.4–7.0)
PLATELETS: 269 10*3/uL (ref 150–379)
RBC: 3.8 x10E6/uL (ref 3.77–5.28)
RDW: 14.1 % (ref 12.3–15.4)
WBC: 6.8 10*3/uL (ref 3.4–10.8)

## 2016-06-28 LAB — CMP14+EGFR
A/G RATIO: 2 (ref 1.2–2.2)
ALBUMIN: 4.4 g/dL (ref 3.6–4.8)
ALK PHOS: 33 IU/L — AB (ref 39–117)
ALT: 10 IU/L (ref 0–32)
AST: 18 IU/L (ref 0–40)
BILIRUBIN TOTAL: 0.6 mg/dL (ref 0.0–1.2)
BUN / CREAT RATIO: 25 (ref 12–28)
BUN: 16 mg/dL (ref 8–27)
CHLORIDE: 101 mmol/L (ref 96–106)
CO2: 23 mmol/L (ref 18–29)
Calcium: 9.3 mg/dL (ref 8.7–10.3)
Creatinine, Ser: 0.65 mg/dL (ref 0.57–1.00)
GFR calc non Af Amer: 91 mL/min/{1.73_m2} (ref >59)
GFR, EST AFRICAN AMERICAN: 105 mL/min/{1.73_m2} (ref >59)
GLUCOSE: 99 mg/dL (ref 65–99)
Globulin, Total: 2.2 g/dL (ref 1.5–4.5)
POTASSIUM: 3.9 mmol/L (ref 3.5–5.2)
Sodium: 140 mmol/L (ref 134–144)
Total Protein: 6.6 g/dL (ref 6.0–8.5)

## 2016-06-28 LAB — VITAMIN D 25 HYDROXY (VIT D DEFICIENCY, FRACTURES): VIT D 25 HYDROXY: 34.5 ng/mL (ref 30.0–100.0)

## 2016-07-12 ENCOUNTER — Telehealth: Payer: Self-pay | Admitting: Family Medicine

## 2016-07-12 ENCOUNTER — Ambulatory Visit (INDEPENDENT_AMBULATORY_CARE_PROVIDER_SITE_OTHER): Payer: Medicare Other | Admitting: Family

## 2016-07-12 ENCOUNTER — Encounter: Payer: Self-pay | Admitting: Family

## 2016-07-12 VITALS — BP 133/71 | HR 65 | Temp 97.7°F | Ht 59.0 in | Wt 99.6 lb

## 2016-07-12 DIAGNOSIS — Z20828 Contact with and (suspected) exposure to other viral communicable diseases: Secondary | ICD-10-CM | POA: Diagnosis not present

## 2016-07-12 DIAGNOSIS — R6889 Other general symptoms and signs: Secondary | ICD-10-CM | POA: Diagnosis not present

## 2016-07-12 MED ORDER — OSELTAMIVIR PHOSPHATE 75 MG PO CAPS: 75.0000 mg | ORAL_CAPSULE | Freq: Two times a day (BID) | ORAL | 0 refills | Status: DC

## 2016-07-12 NOTE — Progress Notes (Signed)
   Subjective:    Patient ID: Karina Clark, female    DOB: 14-Jan-1946, 71 y.o.   MRN: [id number]  Pt presents to the office today with cough. 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  Cough  This is a new problem. The current episode started yesterday. The problem has been gradually worsening. The problem occurs every few minutes. The cough is non-productive. Associated symptoms include headaches, myalgias, nasal congestion, postnasal drip and rhinorrhea. Pertinent negatives include no chills, ear congestion, ear pain, fever, sore throat or shortness of breath. The symptoms are aggravated by lying down. She has tried rest and OTC cough suppressant for the symptoms. The treatment provided mild relief. There is no history of asthma or COPD.      Review of Systems  Constitutional: Positive for fatigue. Negative for chills and fever.  HENT: Positive for postnasal drip and rhinorrhea. Negative for ear pain and sore throat.   Respiratory: Positive for cough. Negative for shortness of breath.   Musculoskeletal: Positive for myalgias.  Neurological: Positive for headaches.       Objective:   Physical Exam  Constitutional: She is oriented to person, place, and time. She appears well-developed and well-nourished. No distress.  HENT:  Head: Normocephalic and atraumatic.  Right Ear: External ear normal.  Left Ear: External ear normal.  Nose: Mucosal edema and rhinorrhea present.  Mouth/Throat: Oropharynx is clear and moist.  Neck: Normal range of motion. Neck supple. No thyromegaly present.  Cardiovascular: Normal rate, regular rhythm, normal heart sounds and intact distal pulses.   No murmur heard. Pulmonary/Chest: Effort normal and breath sounds normal. No respiratory distress. She has no wheezes.  Dry intermittent cough   Abdominal: Soft. Bowel sounds are normal. She exhibits no distension. There is no tenderness.  Musculoskeletal: Normal range of  motion. She exhibits no edema or tenderness.  Neurological: She is alert and oriented to person, place, and time.  Skin: Skin is warm and dry.  Psychiatric: She has a normal mood and affect. Her behavior is normal. Judgment and thought content normal.  Vitals reviewed.     BP 133/71   Pulse 65   Temp 97.7 F (36.5 C) (Oral)   Ht 4\' 11"  (1.499 m)   Wt 99 lb 9.6 oz (45.2 kg)   BMI 20.12 kg/m      Assessment & Plan:  1. Flu-like symptoms -Force fluids -Rest -Good hand hygiene discussed -Avoid crowds and immune compromised people RTO prn  - oseltamivir (TAMIFLU) 75 MG capsule; Take 1 capsule (75 mg total) by mouth 2 (two) times daily.  Dispense: 10 capsule; Refill: 0  2. Exposure to the flu - oseltamivir (TAMIFLU) 75 MG capsule; Take 1 capsule (75 mg total) by mouth 2 (two) times daily.  Dispense: 10 capsule; Refill: 0   Evelina Dun, FNP

## 2016-07-12 NOTE — Patient Instructions (Signed)

## 2016-07-12 NOTE — Telephone Encounter (Signed)
Patient is coming in to be seen.

## 2016-08-03 ENCOUNTER — Encounter: Payer: Self-pay | Admitting: Family Medicine

## 2016-08-03 ENCOUNTER — Ambulatory Visit (INDEPENDENT_AMBULATORY_CARE_PROVIDER_SITE_OTHER): Payer: Medicare Other | Admitting: Family Medicine

## 2016-08-03 ENCOUNTER — Telehealth: Payer: Self-pay | Admitting: Family Medicine

## 2016-08-03 VITALS — BP 131/72 | HR 62 | Temp 97.0°F | Ht 59.0 in | Wt 99.2 lb

## 2016-08-03 DIAGNOSIS — F331 Major depressive disorder, recurrent, moderate: Secondary | ICD-10-CM

## 2016-08-03 DIAGNOSIS — R3915 Urgency of urination: Secondary | ICD-10-CM | POA: Diagnosis not present

## 2016-08-03 LAB — URINALYSIS, COMPLETE
Bilirubin, UA: NEGATIVE
GLUCOSE, UA: NEGATIVE
KETONES UA: NEGATIVE
LEUKOCYTES UA: NEGATIVE
Nitrite, UA: NEGATIVE
Protein, UA: NEGATIVE
SPEC GRAV UA: 1.015 (ref 1.005–1.030)
Urobilinogen, Ur: 0.2 mg/dL (ref 0.2–1.0)
pH, UA: 7.5 (ref 5.0–7.5)

## 2016-08-03 LAB — MICROSCOPIC EXAMINATION: Renal Epithel, UA: NONE SEEN /hpf

## 2016-08-03 MED ORDER — DULOXETINE HCL 60 MG PO CPEP: 60.0000 mg | ORAL_CAPSULE | Freq: Every day | ORAL | 2 refills | Status: DC

## 2016-08-03 MED ORDER — ESCITALOPRAM OXALATE 10 MG PO TABS: 10.0000 mg | ORAL_TABLET | Freq: Every day | ORAL | 2 refills | Status: DC

## 2016-08-03 NOTE — Telephone Encounter (Signed)
Pt aware   Stacks: did you want to do anything about the thyroid, maybe [place]? Switching doctors?

## 2016-08-03 NOTE — Progress Notes (Signed)
Subjective:  Patient ID: Karina Clark, female    DOB: 04-13-1946  Age: 71 y.o. MRN: [id number]  CC: Depression (pt here today for her 1 month follow up after increasing her lexapro from 5 mg to 10mg )   HPI Karina Clark presents for Worried about multiple problems and the possibility of bad outcomes. States she has a lot of guilt. This includes being a former opiate abuser.   Depression screen [facility] 2/9 08/03/2016 07/12/2016 06/27/2016 05/19/2016 03/17/2016  Decreased Interest 2 1 3  0 0  Down, Depressed, Hopeless 2 1 3  0 0  PHQ - 2 Score 4 2 6  0 0  Altered sleeping 2 1 3  - -  Tired, decreased energy 2 1 3  - -  Change in appetite 2 1 3  - -  Feeling bad or failure about yourself  1 1 2  - -  Trouble concentrating 2 1 3  - -  Moving slowly or fidgety/restless 0 0 0 - -  Suicidal thoughts 0 0 0 - -  PHQ-9 Score 13 7 20  - -  Difficult doing work/chores Somewhat difficult - - - -     History Karina Clark has a past medical history of Anxiety; Cataract; Depression; GERD (gastroesophageal reflux disease); Hyperlipidemia; Hypertension; Osteopenia; and Substance abuse.   She has a past surgical history that includes Tubal ligation; Eye surgery; and Nasal septum surgery.   Her family history includes Cancer in her brother; Dementia in her father; Hip fracture in her mother; Hyperlipidemia in her mother; Hypertension in her father; Neuropathy in her brother; Thyroid disease in her mother.She reports that she quit smoking about 21 years ago. Her smoking use included Cigarettes. She started smoking about 47 years ago. She smoked 0.50 packs per day. She has never used smokeless tobacco. She reports that she does not drink alcohol or use drugs.    ROS Review of Systems  Constitutional: Negative for activity change, appetite change and fever.  HENT: Negative for congestion, rhinorrhea and sore throat.   Eyes: Negative for visual disturbance.  Respiratory: Negative for cough and shortness of breath.     Cardiovascular: Negative for chest pain and palpitations.  Gastrointestinal: Negative for abdominal pain, diarrhea and nausea.  Genitourinary: Negative for dysuria.  Musculoskeletal: Negative for arthralgias and myalgias.    Objective:  BP 131/72   Pulse 62   Temp 97 F (36.1 C) (Oral)   Ht 4\' 11"  (1.499 m)   Wt 99 lb 4 oz (45 kg)   BMI 20.05 kg/m   BP Readings from Last 3 Encounters:  08/03/16 131/72  07/12/16 133/71  06/27/16 120/65    Wt Readings from Last 3 Encounters:  08/03/16 99 lb 4 oz (45 kg)  07/12/16 99 lb 9.6 oz (45.2 kg)  06/27/16 100 lb (45.4 kg)     Physical Exam  Constitutional: She is oriented to person, place, and time. She appears well-developed and well-nourished. No distress.  HENT:  Head: Normocephalic and atraumatic.  Right Ear: External ear normal.  Left Ear: External ear normal.  Nose: Nose normal.  Mouth/Throat: Oropharynx is clear and moist.  Eyes: Conjunctivae and EOM are normal. Pupils are equal, round, and reactive to light.  Neck: Normal range of motion. Neck supple. No thyromegaly present.  Cardiovascular: Normal rate, regular rhythm and normal heart sounds.   No murmur heard. Pulmonary/Chest: Effort normal and breath sounds normal. No respiratory distress. She has no wheezes. She has no rales.  Abdominal: Soft. Bowel sounds are normal. She exhibits  no distension. There is no tenderness.  Lymphadenopathy:    She has no cervical adenopathy.  Neurological: She is alert and oriented to person, place, and time. She has normal reflexes.  Skin: Skin is warm and dry.  Psychiatric: She has a normal mood and affect. Her behavior is normal. Judgment and thought content normal.      Assessment & Plan:   Karina Clark was seen today for depression.  Diagnoses and all orders for this visit:  Moderate episode of recurrent major depressive disorder ([facility])  Urinary urgency -     Urinalysis, Complete  Other orders -     Discontinue: DULoxetine  (CYMBALTA) 60 MG capsule; Take 1 capsule (60 mg total) by mouth daily. -     Microscopic Examination -     escitalopram (LEXAPRO) 10 MG tablet; Take 1 tablet (10 mg total) by mouth daily.     I have discontinued Ms. Cogliano's oseltamivir and DULoxetine. I am also having her maintain her aspirin EC, Vitamin D (Cholecalciferol), Omega-3, simvastatin, omeprazole, ketoconazole, zolpidem, Magnesium, and escitalopram.  Allergies as of 08/03/2016      Reactions   Mycelex [clotrimazole] Other (See Comments)   Worsened GERD / nausea   Naproxen Other (See Comments)   "Makes my stomach burn"   Sulfamethoxazole-trimethoprim Nausea And Vomiting      Medication List       Accurate as of 08/03/16 11:59 PM. Always use your most recent med list.          aspirin EC 81 MG tablet Take 81 mg by mouth daily.   escitalopram 10 MG tablet Commonly known as:  LEXAPRO Take 1 tablet (10 mg total) by mouth daily.   ketoconazole 2 % cream Commonly known as:  NIZORAL Apply 1 application topically daily.   Magnesium 250 MG Tabs Take by mouth.   Omega-3 1000 MG Caps Take 3 g by mouth daily.   omeprazole 20 MG capsule Commonly known as:  PRILOSEC Take 1 capsule (20 mg total) by mouth daily.   simvastatin 40 MG tablet Commonly known as:  ZOCOR Take 1 tablet (40 mg total) by mouth daily.   Vitamin D (Cholecalciferol) 1000 units Caps Take 2,000 Units by mouth daily.   zolpidem 10 MG tablet Commonly known as:  AMBIEN Take 1 tablet (10 mg total) by mouth at bedtime.        Follow-up: Return in about 6 weeks (around 09/14/2016).  Claretta Fraise, M.D.

## 2016-08-03 NOTE — Telephone Encounter (Signed)
I sent in the requested prescription 

## 2016-08-22 ENCOUNTER — Telehealth: Payer: Self-pay | Admitting: Family Medicine

## 2016-08-22 NOTE — Telephone Encounter (Signed)
Appt made for follow up  

## 2016-09-13 ENCOUNTER — Ambulatory Visit (INDEPENDENT_AMBULATORY_CARE_PROVIDER_SITE_OTHER): Payer: Medicare Other

## 2016-09-13 ENCOUNTER — Ambulatory Visit (INDEPENDENT_AMBULATORY_CARE_PROVIDER_SITE_OTHER): Payer: Medicare Other | Admitting: Family Medicine

## 2016-09-13 ENCOUNTER — Encounter: Payer: Self-pay | Admitting: Family Medicine

## 2016-09-13 VITALS — BP 129/64 | HR 56 | Temp 97.0°F | Ht 59.0 in | Wt 100.1 lb

## 2016-09-13 DIAGNOSIS — M79601 Pain in right arm: Secondary | ICD-10-CM

## 2016-09-13 DIAGNOSIS — E042 Nontoxic multinodular goiter: Secondary | ICD-10-CM | POA: Diagnosis not present

## 2016-09-13 NOTE — Progress Notes (Signed)
Subjective:  Patient ID: Karina Clark, female    DOB: 04/10/1946  Age: 71 y.o. MRN: [id number]  CC: Depression (pt here today for a 1 month follow up since we increased her Lexapro to 10mg . She is doing better with the increased dose. She would also like to discuss her thyroid.)   HPI Karina Clark presents for Anxiety and depression. Both much improved with the increased dose of Lexapro. It seems to agree with her well. She denies any side effects and states that depression is far better. She is concerned about limited range of motion of her right elbow. Some pain and popping in the right shoulder with movement.  X-ray showed evidence for arthritis of the elbow. Patient states that she had been followed by Dr. Wardell Clark for her thyroid with multiple nodules Karina Clark. She would like for me to assume care of that so she can avoid going to [facility]. Today she says it is been in about a year out since her last thyroid ultrasound and it is due annually. She asked me if I think it's important to schedule it.   History Karina Clark has a past medical history of Anxiety; Cataract; Depression; GERD (gastroesophageal reflux disease); Hyperlipidemia; Hypertension; Osteopenia; and Substance abuse.   She has a past surgical history that includes Tubal ligation; Eye surgery; and Nasal septum surgery.   Her family history includes Cancer in her brother; Dementia in her father; Hip fracture in her mother; Hyperlipidemia in her mother; Hypertension in her father; Neuropathy in her brother; Thyroid disease in her mother.She reports that she quit smoking about 21 years ago. Her smoking use included Cigarettes. She started smoking about 47 years ago. She smoked 0.50 packs per day. She has never used smokeless tobacco. She reports that she does not drink alcohol or use drugs.    ROS Review of Systems  Constitutional: Negative for activity change, appetite change and fever.  HENT: Negative for congestion,  rhinorrhea and sore throat.   Eyes: Negative for visual disturbance.  Respiratory: Negative for cough and shortness of breath.   Cardiovascular: Negative for chest pain and palpitations.  Gastrointestinal: Negative for abdominal pain, diarrhea and nausea.  Genitourinary: Negative for dysuria.  Musculoskeletal: Negative for arthralgias and myalgias.    Objective:  BP 129/64   Pulse (!) 56   Temp 97 F (36.1 C) (Oral)   Ht 4\' 11"  (1.499 m)   Wt 100 lb 2 oz (45.4 kg)   BMI 20.22 kg/m   BP Readings from Last 3 Encounters:  09/13/16 129/64  08/03/16 131/72  07/12/16 133/71    Wt Readings from Last 3 Encounters:  09/13/16 100 lb 2 oz (45.4 kg)  08/03/16 99 lb 4 oz (45 kg)  07/12/16 99 lb 9.6 oz (45.2 kg)     Physical Exam  Constitutional: She is oriented to person, place, and time. She appears well-developed and well-nourished. No distress.  HENT:  Head: Normocephalic and atraumatic.  Right Ear: External ear normal.  Left Ear: External ear normal.  Nose: Nose normal.  Mouth/Throat: Oropharynx is clear and moist.  Eyes: Conjunctivae and EOM are normal. Pupils are equal, round, and reactive to light.  Neck: Normal range of motion. Neck supple. No thyromegaly present.  Cardiovascular: Normal rate, regular rhythm and normal heart sounds.   No murmur heard. Pulmonary/Chest: Effort normal and breath sounds normal. No respiratory distress. She has no wheezes. She has no rales.  Abdominal: Soft. Bowel sounds are normal. She exhibits no distension.  There is no tenderness.  Musculoskeletal:  Right elbow extension is limited to 20. There is a hard stop at that point. Full range of motion at the right shoulder.  Lymphadenopathy:    She has no cervical adenopathy.  Neurological: She is alert and oriented to person, place, and time. She has normal reflexes.  Skin: Skin is warm and dry.  Psychiatric: She has a normal mood and affect. Her behavior is normal. Judgment and thought  content normal.      Assessment & Plan:   Karina Clark was seen today for depression.  Diagnoses and all orders for this visit:  Right arm pain -     DG Shoulder Right; Future -     DG Elbow 2 Views Right; Future  Multinodular goiter -     US SOFT TISSUE HEAD AND NECK; Future       I am having Karina Clark maintain her aspirin EC, Vitamin D (Cholecalciferol), Omega-3, simvastatin, omeprazole, ketoconazole, zolpidem, Magnesium, and escitalopram.  Allergies as of 09/13/2016      Reactions   Mycelex [clotrimazole] Other (See Comments)   Worsened GERD / nausea   Naproxen Other (See Comments)   "Makes my stomach burn"   Sulfamethoxazole-trimethoprim Nausea And Vomiting      Medication List       Accurate as of 09/13/16 10:16 PM. Always use your most recent med list.          aspirin EC 81 MG tablet Take 81 mg by mouth daily.   escitalopram 10 MG tablet Commonly known as:  LEXAPRO Take 1 tablet (10 mg total) by mouth daily.   ketoconazole 2 % cream Commonly known as:  NIZORAL Apply 1 application topically daily.   Magnesium 250 MG Tabs Take by mouth.   Omega-3 1000 MG Caps Take 3 g by mouth daily.   omeprazole 20 MG capsule Commonly known as:  PRILOSEC Take 1 capsule (20 mg total) by mouth daily.   simvastatin 40 MG tablet Commonly known as:  ZOCOR Take 1 tablet (40 mg total) by mouth daily.   Vitamin D (Cholecalciferol) 1000 units Caps Take 2,000 Units by mouth daily.   zolpidem 10 MG tablet Commonly known as:  AMBIEN Take 1 tablet (10 mg total) by mouth at bedtime.        Follow-up: Return in about 3 months (around 12/13/2016).  Karina Clark, M.D.

## 2016-09-16 ENCOUNTER — Ambulatory Visit (HOSPITAL_COMMUNITY): Payer: Self-pay

## 2016-09-19 ENCOUNTER — Ambulatory Visit (HOSPITAL_COMMUNITY)
Admission: RE | Admit: 2016-09-19 | Discharge: 2016-09-19 | Disposition: A | Discharge status: Home / Self Care | Payer: Medicare Other | Source: Ambulatory Visit | Attending: Family Medicine | Admitting: Family Medicine

## 2016-09-19 DIAGNOSIS — E042 Nontoxic multinodular goiter: Secondary | ICD-10-CM | POA: Diagnosis not present

## 2016-09-20 ENCOUNTER — Other Ambulatory Visit: Payer: Self-pay | Admitting: Family Medicine

## 2016-09-20 ENCOUNTER — Telehealth: Payer: Self-pay | Admitting: Family Medicine

## 2016-09-20 NOTE — Telephone Encounter (Signed)
Patient calls again to say it is not time for her mammogram yet.

## 2016-09-27 NOTE — Telephone Encounter (Signed)
Pt is confused as to when her mammograms are to be scheduled. She has received numerous calls from [facility] stating it is past time for her mammogram(last mammo 08/16), but in her Health Maintenance, it states she only needs them every 2 years. She thought this could be an insurance stipulation,age stipulation or doctor preference,but is unsure. I am going to  speak with someone in our billing department ,and our [facility] department that updates the health maintenance to get more clarification.

## 2016-10-17 DIAGNOSIS — L570 Actinic keratosis: Secondary | ICD-10-CM | POA: Diagnosis not present

## 2016-10-28 ENCOUNTER — Encounter: Payer: Self-pay | Admitting: Pharmacist

## 2016-12-19 ENCOUNTER — Ambulatory Visit: Payer: Medicare Other | Admitting: Family Medicine

## 2016-12-21 ENCOUNTER — Ambulatory Visit (INDEPENDENT_AMBULATORY_CARE_PROVIDER_SITE_OTHER): Payer: Medicare Other | Admitting: Family Medicine

## 2016-12-21 ENCOUNTER — Encounter: Payer: Self-pay | Admitting: Family Medicine

## 2016-12-21 ENCOUNTER — Telehealth: Payer: Self-pay | Admitting: Family Medicine

## 2016-12-21 VITALS — BP 106/62 | HR 54 | Temp 97.2°F | Ht 59.0 in | Wt 99.0 lb

## 2016-12-21 DIAGNOSIS — I10 Essential (primary) hypertension: Secondary | ICD-10-CM

## 2016-12-21 DIAGNOSIS — F331 Major depressive disorder, recurrent, moderate: Secondary | ICD-10-CM

## 2016-12-21 DIAGNOSIS — F419 Anxiety disorder, unspecified: Secondary | ICD-10-CM

## 2016-12-21 DIAGNOSIS — F5101 Primary insomnia: Secondary | ICD-10-CM

## 2016-12-21 DIAGNOSIS — F3342 Major depressive disorder, recurrent, in full remission: Secondary | ICD-10-CM | POA: Diagnosis not present

## 2016-12-21 DIAGNOSIS — K219 Gastro-esophageal reflux disease without esophagitis: Secondary | ICD-10-CM | POA: Diagnosis not present

## 2016-12-21 DIAGNOSIS — E782 Mixed hyperlipidemia: Secondary | ICD-10-CM

## 2016-12-21 DIAGNOSIS — M810 Age-related osteoporosis without current pathological fracture: Secondary | ICD-10-CM | POA: Diagnosis not present

## 2016-12-21 MED ORDER — SIMVASTATIN 40 MG PO TABS: 40.0000 mg | ORAL_TABLET | Freq: Every day | ORAL | 4 refills | Status: DC

## 2016-12-21 MED ORDER — ZOLPIDEM TARTRATE 10 MG PO TABS: 10.0000 mg | ORAL_TABLET | Freq: Every day | ORAL | 1 refills | Status: DC

## 2016-12-21 MED ORDER — ESCITALOPRAM OXALATE 10 MG PO TABS: 10.0000 mg | ORAL_TABLET | Freq: Every day | ORAL | 2 refills | Status: DC

## 2016-12-21 MED ORDER — DENOSUMAB 60 MG/ML ~~LOC~~ SOLN
60.0000 mg | Freq: Once | SUBCUTANEOUS | Status: AC
  Administered 2016-12-21: 60 mg via SUBCUTANEOUS

## 2016-12-21 NOTE — Telephone Encounter (Signed)
Pt was seen in the office today and the number we had for the pharmacy was an 888 number and pt had said she had a different number at home and would call me once she got home with the number so I could call her Ambien in. [facility] pharmacy called and Ambien rx called in. Pt is aware.

## 2016-12-21 NOTE — Addendum Note (Signed)
Addended by: Cherre Robins B on: 12/21/2016 01:55 PM   Modules accepted: Orders

## 2016-12-21 NOTE — Addendum Note (Signed)
Addended by: Marylin Crosby on: 12/21/2016 02:22 PM   Modules accepted: Orders

## 2016-12-21 NOTE — Progress Notes (Signed)
Subjective:  Patient ID: Karina Clark, female    DOB: [birth year]  Age: 71 y.o. MRN: [id number]  CC: Follow-up (pt here today for routine follow up of her chronic medical conditions)   HPI Karina Clark presents for  follow-up of hypertension. Patient has no history of headache chest pain or shortness of breath or recent cough. Patient also denies symptoms of TIA such as numbness weakness lateralizing. Patient checks  blood pressure at home and has not had any elevated readings recently. Patient denies side effects from his medication. States taking it regularly.   Patient says that the reflux is quite S and currently she is not having to take the omeprazole. Additionally since she has history of osteoporosis and takes poorly if her that it's her goal to stay off of the PPI as much as possible.  Patient still having some trouble sleeping if she doesn't take the Ambien. However as long she takes Ambien she does well. Frequently half a pill is enough.  Depression screen [facility] 2/9 12/21/2016 09/13/2016 08/03/2016  Decreased Interest '1 1 2  ' Down, Depressed, Hopeless '1 1 2  ' PHQ - 2 Score '2 2 4  ' Altered sleeping 0 1 2  Tired, decreased energy '1 1 2  ' Change in appetite 0 0 2  Feeling bad or failure about yourself  '1 1 1  ' Trouble concentrating 0 0 2  Moving slowly or fidgety/restless 0 0 0  Suicidal thoughts 0 0 0  PHQ-9 Score '4 5 13  ' Difficult doing work/chores - Somewhat difficult Somewhat difficult    History Tiffanie has a past medical history of Anxiety; Cataract; Depression; GERD (gastroesophageal reflux disease); Hyperlipidemia; Hypertension; Osteopenia; and Substance abuse.   She has a past surgical history that includes Tubal ligation; Eye surgery; and Nasal septum surgery.   Her family history includes Cancer in her brother; Dementia in her father; Hip fracture in her mother; Hyperlipidemia in her mother; Hypertension in her father; Neuropathy in her brother; Thyroid disease in  her mother.She reports that she quit smoking about 21 years ago. Her smoking use included Cigarettes. She started smoking about 47 years ago. She smoked 0.50 packs per day. She has never used smokeless tobacco. She reports that she does not drink alcohol or use drugs.    ROS Review of Systems  Constitutional: Negative for activity change, appetite change and fever.  HENT: Negative for congestion, rhinorrhea and sore throat.   Eyes: Negative for visual disturbance.  Respiratory: Negative for cough and shortness of breath.   Cardiovascular: Negative for chest pain and palpitations.  Gastrointestinal: Negative for abdominal pain, diarrhea and nausea.  Genitourinary: Negative for dysuria.  Musculoskeletal: Negative for arthralgias and myalgias.    Objective:  BP 106/62   Pulse (!) 54   Temp (!) 97.2 F (36.2 C) (Oral)   Ht '4\' 11"'  (1.499 m)   Wt 99 lb (44.9 kg)   BMI 20.00 kg/m   BP Readings from Last 3 Encounters:  12/21/16 106/62  09/13/16 129/64  08/03/16 131/72    Wt Readings from Last 3 Encounters:  12/21/16 99 lb (44.9 kg)  09/13/16 100 lb 2 oz (45.4 kg)  08/03/16 99 lb 4 oz (45 kg)     Physical Exam  Constitutional: She is oriented to person, place, and time. She appears well-developed and well-nourished. No distress.  HENT:  Head: Normocephalic and atraumatic.  Right Ear: External ear normal.  Left Ear: External ear normal.  Nose: Nose normal.  Mouth/Throat: Oropharynx  is clear and moist.  Eyes: Pupils are equal, round, and reactive to light. Conjunctivae and EOM are normal.  Neck: Normal range of motion. Neck supple. No thyromegaly present.  Cardiovascular: Normal rate, regular rhythm and normal heart sounds.   No murmur heard. Pulmonary/Chest: Effort normal and breath sounds normal. No respiratory distress. She has no wheezes. She has no rales.  Abdominal: Soft. Bowel sounds are normal. She exhibits no distension. There is no tenderness.  Lymphadenopathy:     She has no cervical adenopathy.  Neurological: She is alert and oriented to person, place, and time. She has normal reflexes.  Skin: Skin is warm and dry.  Psychiatric: She has a normal mood and affect. Her behavior is normal. Judgment and thought content normal.      Assessment & Plan:   Lisa was seen today for follow-up.  Diagnoses and all orders for this visit:  Gastroesophageal reflux disease without esophagitis -     CBC with Differential/Platelet -     CMP14+EGFR  Anxiety -     CMP14+EGFR  Mixed hyperlipidemia -     CMP14+EGFR -     Lipid panel  Essential hypertension -     CBC with Differential/Platelet -     CMP14+EGFR  Primary insomnia -     CMP14+EGFR  Age-related osteoporosis without current pathological fracture -     CMP14+EGFR  Recurrent major depressive disorder, in full remission ([place]) -     CMP14+EGFR       I have discontinued Ms. Sulak's omeprazole. I am also having her maintain her aspirin EC, Vitamin D (Cholecalciferol), Omega-3, simvastatin, ketoconazole, zolpidem, Magnesium, and escitalopram.  Allergies as of 12/21/2016      Reactions   Mycelex [clotrimazole] Other (See Comments)   Worsened GERD / nausea   Naproxen Other (See Comments)   "Makes my stomach burn"   Sulfamethoxazole-trimethoprim Nausea And Vomiting      Medication List       Accurate as of 12/21/16  1:49 PM. Always use your most recent med list.          aspirin EC 81 MG tablet Take 81 mg by mouth daily.   escitalopram 10 MG tablet Commonly known as:  LEXAPRO Take 1 tablet (10 mg total) by mouth daily.   ketoconazole 2 % cream Commonly known as:  NIZORAL Apply 1 application topically daily.   Magnesium 250 MG Tabs Take by mouth.   Omega-3 1000 MG Caps Take 3 g by mouth daily.   simvastatin 40 MG tablet Commonly known as:  ZOCOR Take 1 tablet (40 mg total) by mouth daily.   Vitamin D (Cholecalciferol) 1000 units Caps Take 2,000 Units by mouth  daily.   zolpidem 10 MG tablet Commonly known as:  AMBIEN Take 1 tablet (10 mg total) by mouth at bedtime.        Follow-up: Return in about 6 months (around 06/23/2017).  Claretta Fraise, M.D.

## 2016-12-22 LAB — CMP14+EGFR
A/G RATIO: 2.1 (ref 1.2–2.2)
ALK PHOS: 46 IU/L (ref 39–117)
ALT: 14 IU/L (ref 0–32)
AST: 18 IU/L (ref 0–40)
Albumin: 4.4 g/dL (ref 3.6–4.8)
BUN/Creatinine Ratio: 17 (ref 12–28)
BUN: 14 mg/dL (ref 8–27)
Bilirubin Total: 0.4 mg/dL (ref 0.0–1.2)
CO2: 26 mmol/L (ref 20–29)
CREATININE: 0.81 mg/dL (ref 0.57–1.00)
Calcium: 9.5 mg/dL (ref 8.7–10.3)
Chloride: 99 mmol/L (ref 96–106)
GFR calc Af Amer: 86 mL/min/{1.73_m2} (ref >59)
GFR calc non Af Amer: 74 mL/min/{1.73_m2} (ref >59)
GLOBULIN, TOTAL: 2.1 g/dL (ref 1.5–4.5)
Glucose: 84 mg/dL (ref 65–99)
POTASSIUM: 4.5 mmol/L (ref 3.5–5.2)
SODIUM: 139 mmol/L (ref 134–144)
Total Protein: 6.5 g/dL (ref 6.0–8.5)

## 2016-12-22 LAB — CBC WITH DIFFERENTIAL/PLATELET
Basophils Absolute: 0 x10E3/uL (ref 0.0–0.2)
Basos: 1 %
EOS (ABSOLUTE): 0.1 x10E3/uL (ref 0.0–0.4)
Eos: 1 %
Hematocrit: 35.3 % (ref 34.0–46.6)
Hemoglobin: 11.6 g/dL (ref 11.1–15.9)
Immature Grans (Abs): 0 x10E3/uL (ref 0.0–0.1)
Immature Granulocytes: 0 %
Lymphocytes Absolute: 2 x10E3/uL (ref 0.7–3.1)
Lymphs: 41 %
MCH: 31.4 pg (ref 26.6–33.0)
MCHC: 32.9 g/dL (ref 31.5–35.7)
MCV: 95 fL (ref 79–97)
Monocytes Absolute: 0.4 x10E3/uL (ref 0.1–0.9)
Monocytes: 7 %
Neutrophils Absolute: 2.5 x10E3/uL (ref 1.4–7.0)
Neutrophils: 50 %
Platelets: 247 x10E3/uL (ref 150–379)
RBC: 3.7 x10E6/uL — ABNORMAL LOW (ref 3.77–5.28)
RDW: 13.8 % (ref 12.3–15.4)
WBC: 5 x10E3/uL (ref 3.4–10.8)

## 2016-12-22 LAB — LIPID PANEL
CHOLESTEROL TOTAL: 171 mg/dL (ref 100–199)
Chol/HDL Ratio: 2.3 ratio (ref 0.0–4.4)
HDL: 74 mg/dL (ref >39)
LDL Calculated: 77 mg/dL (ref 0–99)
Triglycerides: 100 mg/dL (ref 0–149)
VLDL Cholesterol Cal: 20 mg/dL (ref 5–40)

## 2017-02-27 ENCOUNTER — Ambulatory Visit (INDEPENDENT_AMBULATORY_CARE_PROVIDER_SITE_OTHER): Payer: Medicare Other | Admitting: *Deleted

## 2017-02-27 DIAGNOSIS — Z23 Encounter for immunization: Secondary | ICD-10-CM

## 2017-02-27 DIAGNOSIS — Z1231 Encounter for screening mammogram for malignant neoplasm of breast: Secondary | ICD-10-CM | POA: Diagnosis not present

## 2017-02-27 NOTE — Progress Notes (Signed)
Pt given flu vaccine Tolerated well 

## 2017-03-13 DIAGNOSIS — H04123 Dry eye syndrome of bilateral lacrimal glands: Secondary | ICD-10-CM | POA: Diagnosis not present

## 2017-03-13 DIAGNOSIS — H40033 Anatomical narrow angle, bilateral: Secondary | ICD-10-CM | POA: Diagnosis not present

## 2017-04-17 DIAGNOSIS — L570 Actinic keratosis: Secondary | ICD-10-CM | POA: Diagnosis not present

## 2017-04-17 DIAGNOSIS — H61002 Unspecified perichondritis of left external ear: Secondary | ICD-10-CM | POA: Diagnosis not present

## 2017-04-17 DIAGNOSIS — D1800 Hemangioma unspecified site: Secondary | ICD-10-CM | POA: Diagnosis not present

## 2017-04-17 DIAGNOSIS — D485 Neoplasm of uncertain behavior of skin: Secondary | ICD-10-CM | POA: Diagnosis not present

## 2017-04-28 ENCOUNTER — Other Ambulatory Visit: Payer: Self-pay | Admitting: Family Medicine

## 2017-04-28 DIAGNOSIS — M810 Age-related osteoporosis without current pathological fracture: Secondary | ICD-10-CM

## 2017-05-03 ENCOUNTER — Other Ambulatory Visit: Payer: Self-pay | Admitting: Family Medicine

## 2017-05-03 DIAGNOSIS — F331 Major depressive disorder, recurrent, moderate: Secondary | ICD-10-CM

## 2017-05-08 ENCOUNTER — Ambulatory Visit (INDEPENDENT_AMBULATORY_CARE_PROVIDER_SITE_OTHER): Payer: Medicare Other

## 2017-05-08 DIAGNOSIS — Z780 Asymptomatic menopausal state: Secondary | ICD-10-CM | POA: Diagnosis not present

## 2017-05-08 DIAGNOSIS — M8589 Other specified disorders of bone density and structure, multiple sites: Secondary | ICD-10-CM | POA: Diagnosis not present

## 2017-05-08 DIAGNOSIS — M810 Age-related osteoporosis without current pathological fracture: Secondary | ICD-10-CM | POA: Diagnosis not present

## 2017-06-23 ENCOUNTER — Encounter: Payer: Self-pay | Admitting: Family Medicine

## 2017-06-23 ENCOUNTER — Ambulatory Visit: Payer: Medicare Other | Admitting: Family Medicine

## 2017-06-23 VITALS — BP 125/67 | HR 53 | Temp 97.0°F | Ht 59.0 in | Wt 100.0 lb

## 2017-06-23 DIAGNOSIS — E782 Mixed hyperlipidemia: Secondary | ICD-10-CM | POA: Diagnosis not present

## 2017-06-23 DIAGNOSIS — M1991 Primary osteoarthritis, unspecified site: Secondary | ICD-10-CM

## 2017-06-23 DIAGNOSIS — K219 Gastro-esophageal reflux disease without esophagitis: Secondary | ICD-10-CM

## 2017-06-23 DIAGNOSIS — I10 Essential (primary) hypertension: Secondary | ICD-10-CM | POA: Diagnosis not present

## 2017-06-23 MED ORDER — ESCITALOPRAM OXALATE 20 MG PO TABS: 20.0000 mg | ORAL_TABLET | Freq: Every day | ORAL | 1 refills | Status: DC

## 2017-06-23 MED ORDER — ZOLPIDEM TARTRATE 10 MG PO TABS: 10.0000 mg | ORAL_TABLET | Freq: Every day | ORAL | 1 refills | Status: DC

## 2017-06-23 NOTE — Progress Notes (Signed)
Subjective:  Patient ID: Karina Clark, female    DOB: 04/17/1946  Age: 72 y.o. MRN: [id number]  CC: Follow-up (pt here today for routine follow up of her chronic medical conditions, no other concerns voiced)   HPI Karina Clark presents for follow-up of elevated cholesterol. Doing well without complaints on current medication. Denies side effects of statin including myalgia and arthralgia and nausea. Also in today for liver function testing. Currently no chest pain, shortness of breath or other cardiovascular related symptoms noted.  Having some feelings of being down and sad tired a lot.  She wakes up a lot during the night.  Some of it is because she is caring for 72 year old.  Her her patient will Karina Clark making noise or need her during the night and she will wake up and have to check on her.  Depression screen Karina Clark 2/9 06/23/2017 12/21/2016 09/13/2016  Decreased Interest '1 1 1  ' Down, Depressed, Hopeless '1 1 1  ' PHQ - 2 Score '2 2 2  ' Altered sleeping 0 0 1  Tired, decreased energy '1 1 1  ' Change in appetite 0 0 0  Feeling bad or failure about yourself  0 1 1  Trouble concentrating 0 0 0  Moving slowly or fidgety/restless 0 0 0  Suicidal thoughts 0 0 0  PHQ-9 Score '3 4 5  ' Difficult doing work/chores - - Somewhat difficult  Some recent data might be hidden    History Karina Clark has a past medical history of Anxiety, Cataract, Depression, GERD (gastroesophageal reflux disease), Hyperlipidemia, Hypertension, Osteopenia, and Substance abuse (Karina Clark).   She has a past surgical history that includes Tubal ligation; Eye surgery; and Nasal septum surgery.   Her family history includes Cancer in her brother; Dementia in her father; Hip fracture in her mother; Hyperlipidemia in her mother; Hypertension in her father; Neuropathy in her brother; Thyroid disease in her mother.She reports that she quit smoking about 22 years ago. Her smoking use included cigarettes. She started smoking about 48 years ago.  She smoked 0.50 packs per day. she has never used smokeless tobacco. She reports that she does not drink alcohol or use drugs.  Current Outpatient Medications on File Prior to Visit  Medication Sig Dispense Refill  . aspirin EC 81 MG tablet Take 81 mg by mouth daily.    Marland Kitchen ketoconazole (NIZORAL) 2 % cream Apply 1 application topically daily.    . Magnesium 250 MG TABS Take by mouth.    . Omega-3 1000 MG CAPS Take 3 g by mouth daily.     . simvastatin (ZOCOR) 40 MG tablet Take 1 tablet (40 mg total) by mouth daily. 90 tablet 4  . Vitamin D, Cholecalciferol, 1000 UNITS CAPS Take 2,000 Units by mouth daily.     Marland Kitchen zolpidem (AMBIEN) 10 MG tablet Take 1 tablet (10 mg total) by mouth at bedtime. 90 tablet 1   No current facility-administered medications on file prior to visit.     ROS Review of Systems  Constitutional: Negative for activity change, appetite change and fever.  HENT: Negative for congestion, rhinorrhea and sore throat.   Eyes: Negative for visual disturbance.  Respiratory: Negative for cough and shortness of breath.   Cardiovascular: Negative for chest pain and palpitations.  Gastrointestinal: Negative for abdominal pain, diarrhea and nausea.  Genitourinary: Negative for dysuria.  Musculoskeletal: Positive for arthralgias (Not requiring medication at this time). Negative for myalgias.  Psychiatric/Behavioral: Positive for dysphoric mood.    Objective:  BP 125/67  Pulse (!) 53   Temp (!) 97 F (36.1 C) (Oral)   Ht '4\' 11"'  (1.499 m)   Wt 100 lb (45.4 kg)   BMI 20.20 kg/m   BP Readings from Last 3 Encounters:  06/23/17 125/67  12/21/16 106/62  09/13/16 129/64    Wt Readings from Last 3 Encounters:  06/23/17 100 lb (45.4 kg)  12/21/16 99 lb (44.9 kg)  09/13/16 100 lb 2 oz (45.4 kg)     Physical Exam  Constitutional: She is oriented to person, place, and time. She appears well-developed and well-nourished. No distress.  HENT:  Head: Normocephalic and  atraumatic.  Right Ear: External ear normal.  Left Ear: External ear normal.  Nose: Nose normal.  Mouth/Throat: Oropharynx is clear and moist.  Eyes: Conjunctivae and EOM are normal. Pupils are equal, round, and reactive to light.  Neck: Normal range of motion. Neck supple. No thyromegaly present.  Cardiovascular: Normal rate, regular rhythm and normal heart sounds.  No murmur heard. Pulmonary/Chest: Effort normal and breath sounds normal. No respiratory distress. She has no wheezes. She has no rales.  Abdominal: Soft. Bowel sounds are normal. She exhibits no distension. There is no tenderness.  Lymphadenopathy:    She has no cervical adenopathy.  Neurological: She is alert and oriented to person, place, and time. She has normal reflexes.  Skin: Skin is warm and dry.  Psychiatric: She has a normal mood and affect. Her behavior is normal. Judgment and thought content normal.    No results found for: HGBA1C  Lab Results  Component Value Date   WBC 5.0 12/21/2016   HGB 11.6 12/21/2016   HCT 35.3 12/21/2016   PLT 247 12/21/2016   GLUCOSE 84 12/21/2016   CHOL 171 12/21/2016   TRIG 100 12/21/2016   HDL 74 12/21/2016   LDLCALC 77 12/21/2016   ALT 14 12/21/2016   AST 18 12/21/2016   NA 139 12/21/2016   K 4.5 12/21/2016   CL 99 12/21/2016   CREATININE 0.81 12/21/2016   BUN 14 12/21/2016   CO2 26 12/21/2016   TSH 2.520 07/23/2015    US Soft Tissue Head And Neck  Result Date: 09/19/2016 CLINICAL DATA:  Multinodular goiter follow-up. EXAM: THYROID ULTRASOUND TECHNIQUE: Ultrasound examination of the thyroid gland and adjacent soft tissues was performed. COMPARISON:  Outside report.  No images available. FINDINGS: Parenchymal Echotexture: Mildly heterogenous Isthmus: 0.2 cm in the AP dimension Right lobe: 3.1 x 1.0 x 1.0 cm Left lobe: 3.6 x 0.8 x 1.0 cm _________________________________________________________ Estimated total number of nodules >/= 1 cm: 0 Number of spongiform nodules  >/=  2 cm not described below (TR1): 0 Number of mixed cystic and solid nodules >/= 1.5 cm not described below (TR2): 0 _________________________________________________________ Few small nodules in the thyroid tissue. Largest nodule is located in the left mid thyroid lobe and measures up to 0.5 cm. This is a partially cystic and solid nodule. IMPRESSION: Small thyroid nodules. These nodules do not meet criteria for biopsy or dedicated follow-up. The above is in keeping with the ACR TI-RADS recommendations - J Am Coll Radiol 2017;14:587-595. Electronically Signed   By: Markus Daft M.D.   On: 09/19/2016 16:52    Assessment & Plan:   Zavannah was seen today for follow-up.  Diagnoses and all orders for this visit:  Essential hypertension -     CBC with Differential/Platelet -     CMP14+EGFR  Mixed hyperlipidemia -     Lipid panel  Primary osteoarthritis, unspecified  site  Gastroesophageal reflux disease without esophagitis  Other orders -     escitalopram (LEXAPRO) 20 MG tablet; Take 1 tablet (20 mg total) by mouth daily. -     zolpidem (AMBIEN) 10 MG tablet; Take 1 tablet (10 mg total) by mouth at bedtime.   I have discontinued Cresenciano Lick. Meng's escitalopram and escitalopram. I am also having her start on escitalopram. Additionally, I am having her maintain her aspirin EC, Vitamin D (Cholecalciferol), Omega-3, ketoconazole, Magnesium, simvastatin, and zolpidem.  Meds ordered this encounter  Medications  . escitalopram (LEXAPRO) 20 MG tablet    Sig: Take 1 tablet (20 mg total) by mouth daily.    Dispense:  90 tablet    Refill:  1     Follow-up: No Follow-up on file.  Claretta Fraise, M.D.

## 2017-06-24 LAB — CMP14+EGFR
ALBUMIN: 4.3 g/dL (ref 3.5–4.8)
ALK PHOS: 45 IU/L (ref 39–117)
ALT: 17 IU/L (ref 0–32)
AST: 18 IU/L (ref 0–40)
Albumin/Globulin Ratio: 2 (ref 1.2–2.2)
BILIRUBIN TOTAL: 0.3 mg/dL (ref 0.0–1.2)
BUN/Creatinine Ratio: 19 (ref 12–28)
BUN: 14 mg/dL (ref 8–27)
CHLORIDE: 102 mmol/L (ref 96–106)
CO2: 22 mmol/L (ref 20–29)
CREATININE: 0.73 mg/dL (ref 0.57–1.00)
Calcium: 9.3 mg/dL (ref 8.7–10.3)
GFR calc Af Amer: 96 mL/min/{1.73_m2} (ref >59)
GFR calc non Af Amer: 84 mL/min/{1.73_m2} (ref >59)
GLOBULIN, TOTAL: 2.2 g/dL (ref 1.5–4.5)
Glucose: 87 mg/dL (ref 65–99)
Potassium: 4.2 mmol/L (ref 3.5–5.2)
SODIUM: 142 mmol/L (ref 134–144)
Total Protein: 6.5 g/dL (ref 6.0–8.5)

## 2017-06-24 LAB — LIPID PANEL
CHOLESTEROL TOTAL: 186 mg/dL (ref 100–199)
Chol/HDL Ratio: 2.5 ratio (ref 0.0–4.4)
HDL: 74 mg/dL (ref >39)
LDL CALC: 89 mg/dL (ref 0–99)
Triglycerides: 117 mg/dL (ref 0–149)
VLDL Cholesterol Cal: 23 mg/dL (ref 5–40)

## 2017-06-24 LAB — CBC WITH DIFFERENTIAL/PLATELET
Basophils Absolute: 0 10*3/uL (ref 0.0–0.2)
Basos: 0 %
EOS (ABSOLUTE): 0.1 10*3/uL (ref 0.0–0.4)
EOS: 1 %
HEMATOCRIT: 34.2 % (ref 34.0–46.6)
HEMOGLOBIN: 11.8 g/dL (ref 11.1–15.9)
Immature Grans (Abs): 0 10*3/uL (ref 0.0–0.1)
Immature Granulocytes: 0 %
LYMPHS ABS: 2.4 10*3/uL (ref 0.7–3.1)
Lymphs: 38 %
MCH: 31.9 pg (ref 26.6–33.0)
MCHC: 34.5 g/dL (ref 31.5–35.7)
MCV: 92 fL (ref 79–97)
MONOCYTES: 8 %
Monocytes Absolute: 0.5 10*3/uL (ref 0.1–0.9)
NEUTROS ABS: 3.3 10*3/uL (ref 1.4–7.0)
Neutrophils: 53 %
Platelets: 238 10*3/uL (ref 150–379)
RBC: 3.7 x10E6/uL — ABNORMAL LOW (ref 3.77–5.28)
RDW: 13.6 % (ref 12.3–15.4)
WBC: 6.3 10*3/uL (ref 3.4–10.8)

## 2017-07-25 ENCOUNTER — Telehealth: Payer: Self-pay | Admitting: Family Medicine

## 2017-07-26 NOTE — Telephone Encounter (Signed)
Aware of last lab results.

## 2017-08-07 ENCOUNTER — Ambulatory Visit (INDEPENDENT_AMBULATORY_CARE_PROVIDER_SITE_OTHER): Payer: Medicare Other | Admitting: *Deleted

## 2017-08-07 ENCOUNTER — Encounter: Payer: Self-pay | Admitting: *Deleted

## 2017-08-07 VITALS — BP 112/58 | HR 56 | Ht <= 58 in | Wt 100.0 lb

## 2017-08-07 DIAGNOSIS — Z0000 Encounter for general adult medical examination without abnormal findings: Secondary | ICD-10-CM

## 2017-08-07 NOTE — Patient Instructions (Addendum)
  Karina Clark , Thank you for taking time to come for your Medicare Wellness Visit. I appreciate your ongoing commitment to your health goals. Please review the following plan we discussed and let me know if I can assist you in the future.   These are the goals we discussed: Goals    . Exercise 3x per week (30 min per time)     Try walking, low impact aerobics, swimming, etc.    . Reduce sodium intake      Check on the cost of Tdap at your pharmacy or at your next visit at our office  This is a list of the screening recommended for you and due dates:  Health Maintenance  Topic Date Due  . Tetanus Vaccine  06/28/2015  . Mammogram  02/28/2019  . DEXA scan (bone density measurement)  05/09/2019  . Colon Cancer Screening  07/26/2023  . Flu Shot  Completed  .  Hepatitis C: One time screening is recommended by Center for Disease Control  (CDC) for  adults born from [birth year] through 1964.   Completed  . Pneumonia vaccines  Completed

## 2017-08-07 NOTE — Progress Notes (Addendum)
Subjective:   Karina Clark is a 72 y.o. female who presents for a subsequent Medicare Annual Wellness Visit. 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 She has one adult daughter, a grandson, and a granddaughter. She has been widowed since 1998. Her marriage was not a happy one and he was a bad influence on her social activities. She enjoys reading and watching television.   Review of Systems    Reports her health is about the same as last year.   Cardiac Risk Factors include: sedentary lifestyle;advanced age (>84men, >76 women)  MS: Has been seeing a Restaurant manager, fast food in [place] for the past 3 years for back pain and It has helped.  Other systems negative    Objective:    Today's Vitals   08/07/17 1413  BP: (!) 112/58  Pulse: (!) 56  Weight: 100 lb (45.4 kg)  Height: 4\' 10"  (1.473 m)   Body mass index is 20.9 kg/m.  Advanced Directives 08/07/2017 11/17/2015  Does Patient Have a Medical Advance Directive? Yes No  Type of Paramedic of [place];Living will -  Does patient want to make changes to medical advance directive? No - Patient declined -  Copy of [place] in Chart? No - copy requested -  Would patient like information on creating a medical advance directive? - No - patient declined information;Yes - Educational materials given    Current Medications (verified) Outpatient Encounter Medications as of 08/07/2017  Medication Sig  . aspirin EC 81 MG tablet Take 81 mg by mouth daily.  Marland Kitchen escitalopram (LEXAPRO) 20 MG tablet Take 1 tablet (20 mg total) by mouth daily.  Marland Kitchen ketoconazole (NIZORAL) 2 % cream Apply 1 application topically daily.  . Magnesium 250 MG TABS Take by mouth.  . Multiple Vitamins-Minerals (EQ VISION FORMULA 50+) CAPS Take 1 capsule by mouth.  . Omega-3 1000 MG CAPS Take 3 g by mouth daily.   . simvastatin (ZOCOR) 40 MG tablet Take 1 tablet (40 mg total) by  mouth daily.  . Vitamin D, Cholecalciferol, 1000 UNITS CAPS Take 2,000 Units by mouth daily.   Marland Kitchen zolpidem (AMBIEN) 10 MG tablet Take 1 tablet (10 mg total) by mouth at bedtime.   No facility-administered encounter medications on file as of 08/07/2017.     Allergies (verified) Mycelex [clotrimazole]; Naproxen; and Sulfamethoxazole-trimethoprim   History: Past Medical History:  Diagnosis Date  . Anxiety   . Cataract   . Depression   . GERD (gastroesophageal reflux disease)   . Hyperlipidemia   . Hypertension   . Osteopenia   . Substance abuse ([place])    DCed cocaine in 1998   Past Surgical History:  Procedure Laterality Date  . EYE SURGERY    . NASAL SEPTUM SURGERY    . TUBAL LIGATION     Family History  Problem Relation Age of Onset  . Hyperlipidemia Mother   . Thyroid disease Mother   . Hip fracture Mother   . Hypertension Father   . Dementia Father   . Cancer Brother        spine  . Neuropathy Brother   . Hypertension Daughter    Social History   Socioeconomic History  . Marital status: Widowed    Spouse name: None  . Number of children: 1  . Years of education: 64  . Highest education level: 12th grade  Social Needs  . Financial  resource strain: Not hard at all  . Food insecurity - worry: Never true  . Food insecurity - inability: Never true  . Transportation needs - medical: No  . Transportation needs - non-medical: No  Occupational History  . 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 Smoking status: Former Smoker    Packs/day: 0.50    Types: Cigarettes    Start date: 03/24/1969    Last attempt to quit: 02/05/1995    Years since quitting: 22.5  . Smokeless tobacco: Never Used  Substance and Sexual Activity  . Alcohol use: No    Alcohol/week: 0.0 oz  . Drug use: No  . Sexual activity: No  Other Topics Concern  . None  Social History Narrative   Widowed since 1998. She has one adult daughter and 2  grandchildren. She enjoys reading. She lives in a one story home with steps up onto her deck with a handrail.     Clinical Intake:    Pain : No/denies pain    Nutritional Status: BMI of 19-24  Normal Diabetes: No  How often do you need to have someone help you when you read instructions, pamphlets, or other written materials from your doctor or pharmacy?: 1 - Never What is the last grade level you completed in school?: 12th  Interpreter Needed?: No  Information entered by :: Chong Sicilian, RN   Activities of Daily Living In your present state of health, do you have any difficulty performing the following activities: 08/07/2017  Hearing? N  Vision? N  Comment cataract surgery a couple of years ago. Eye exam is up to date and sees Dr Marin Comment  Difficulty concentrating or making decisions? N  Walking or climbing stairs? N  Dressing or bathing? N  Doing errands, shopping? N  Preparing Food and eating ? N  Using the Toilet? N  In the past six months, have you accidently leaked urine? Y  Comment Stress incontinence and some urge incontinence  Do you have problems with loss of bowel control? N  Managing your Medications? N  Managing your Finances? N  Housekeeping or managing your Housekeeping? N  Some recent data might be hidden     Immunizations and Health Maintenance Immunization History  Administered Date(s) Administered  . Influenza, High Dose Seasonal PF 02/23/2016, 02/27/2017  . Influenza,inj,Quad PF,6+ Mos 03/05/2015  . Pneumococcal Conjugate-13 11/28/2013  . Pneumococcal Polysaccharide-23 05/09/2012  . Zoster 01/07/2008   Health Maintenance Due  Topic Date Due  . Samul Dada  06/28/2015    Patient Care Team: Claretta Fraise, MD as PCP - General (Family Medicine) Jyl Heinz, MD as Consulting Physician (Endocrinology) Sandford Craze, MD as Referring Physician (Dermatology)  No hospitalizations, ER visits, or surgeries this past year.         Assessment:   This is a routine wellness examination for [facility].  Hearing/Vision screen No deficits noted during visit.   Dietary issues and exercise activities discussed: Current Exercise Habits: The patient does not participate in regular exercise at present, Exercise limited by: None identified   Diet Typical Day: Cereal with almond milk,healthy choice meals, chobani yogurt 60 to 80 ounces of water a day Grape juice 8 oz a day  Goals    . Exercise 3x per week (30 min per time)     Try walking, low impact aerobics, swimming, etc.    . Reduce sodium intake      Depression Screen PHQ  2/9 Scores 08/07/2017 06/23/2017 12/21/2016 09/13/2016 08/03/2016 07/12/2016 06/27/2016  PHQ - 2 Score 1 2 2 2 4 2 6   PHQ- 9 Score - 3 4 5 13 7 20     Fall Risk Fall Risk  08/07/2017 06/23/2017 09/13/2016 08/03/2016 07/12/2016  Falls in the past year? Yes Yes Yes Yes Yes  Number falls in past yr: 1 1 2  or more 2 or more 2 or more  Comment - - - - -  Injury with Fall? No No No No No  Risk Factor Category  - - High Fall Risk High Fall Risk -  Risk for fall due to : - - History of fall(s) History of fall(s) -  Follow up Falls prevention discussed - Falls prevention discussed Falls prevention discussed -    Is the patient's home free of loose throw rugs in walkways, pet beds, electrical cords, etc?   no      Grab bars in the bathroom? no      Handrails on the stairs?   yes      Adequate lighting?   yes Has a standup shower and not a tub   Cognitive Function: MMSE - Mini Mental State Exam 08/07/2017 11/17/2015  Orientation to time 5 5  Orientation to Place 5 5  Registration 3 3  Attention/ Calculation 5 5  Recall 3 3  Language- name 2 objects 2 2  Language- repeat 1 1  Language- follow 3 step command 3 3  Language- read & follow direction 1 1  Write a sentence 1 1  Copy design 1 1  Total score 30 30    Normal exam    Screening Tests Health Maintenance  Topic Date Due  . TETANUS/TDAP  06/28/2015   . MAMMOGRAM  02/28/2019  . DEXA SCAN  05/09/2019  . COLONOSCOPY  07/26/2023  . INFLUENZA VACCINE  Completed  . Hepatitis C Screening  Completed  . PNA vac Low Risk Adult  Completed      Plan:  Check on Tdap cost at your next visit.  Reduce sodium intake Increase activity level. Aim for 150 min of moderate activity a week Keep f/u with PCP  I have personally reviewed and noted the following in the patient's chart:   . Medical and social history . Use of alcohol, tobacco or illicit drugs  . Current medications and supplements . Functional ability and status . Nutritional status . Physical activity . Advanced directives . List of other physicians . Hospitalizations, surgeries, and ER visits in previous 12 months . Vitals . Screenings to include cognitive, depression, and falls . Referrals and appointments  In addition, I have reviewed and discussed with patient certain preventive protocols, quality metrics, and best practice recommendations. A written personalized care plan for preventive services as well as general preventive health recommendations were provided to patient.     Chong Sicilian, RN  08/08/2017    I have reviewed and agree with the above AWV documentation.  Claretta Fraise, M.D.

## 2017-09-28 ENCOUNTER — Telehealth: Payer: Self-pay | Admitting: Family Medicine

## 2017-09-28 NOTE — Telephone Encounter (Signed)
Please advise 

## 2017-09-28 NOTE — Telephone Encounter (Signed)
Called pt to let her know no additional follow-up is needed and a female answered the phone and we became disconnected. I called back and no one answered the phone and no vm was available

## 2017-10-02 DIAGNOSIS — H10013 Acute follicular conjunctivitis, bilateral: Secondary | ICD-10-CM | POA: Diagnosis not present

## 2017-10-16 DIAGNOSIS — L430 Hypertrophic lichen planus: Secondary | ICD-10-CM | POA: Diagnosis not present

## 2017-10-16 DIAGNOSIS — L219 Seborrheic dermatitis, unspecified: Secondary | ICD-10-CM | POA: Diagnosis not present

## 2017-10-16 DIAGNOSIS — D485 Neoplasm of uncertain behavior of skin: Secondary | ICD-10-CM | POA: Diagnosis not present

## 2017-10-16 DIAGNOSIS — L570 Actinic keratosis: Secondary | ICD-10-CM | POA: Diagnosis not present

## 2017-12-22 ENCOUNTER — Ambulatory Visit: Payer: Medicare Other | Admitting: Family Medicine

## 2017-12-22 ENCOUNTER — Encounter: Payer: Self-pay | Admitting: Family Medicine

## 2017-12-22 VITALS — BP 104/59 | HR 55 | Temp 97.1°F | Ht <= 58 in | Wt 100.0 lb

## 2017-12-22 DIAGNOSIS — H00025 Hordeolum internum left lower eyelid: Secondary | ICD-10-CM | POA: Diagnosis not present

## 2017-12-22 DIAGNOSIS — E782 Mixed hyperlipidemia: Secondary | ICD-10-CM

## 2017-12-22 DIAGNOSIS — M1991 Primary osteoarthritis, unspecified site: Secondary | ICD-10-CM | POA: Diagnosis not present

## 2017-12-22 DIAGNOSIS — F3342 Major depressive disorder, recurrent, in full remission: Secondary | ICD-10-CM

## 2017-12-22 DIAGNOSIS — F419 Anxiety disorder, unspecified: Secondary | ICD-10-CM | POA: Diagnosis not present

## 2017-12-22 MED ORDER — TOBRAMYCIN-DEXAMETHASONE 0.3-0.1 % OP SUSP: OPHTHALMIC | 0 refills | Status: DC

## 2017-12-22 NOTE — Progress Notes (Signed)
Subjective:  Patient ID: Karina Clark, female    DOB: 1945/10/01  Age: 72 y.o. MRN: [id number]  CC: Medical Management of Chronic Issues   HPI Karina Clark states that her depression and anxiety are doing well recently.  Karina Clark continues to take her medication without side effects and is thriving as long Karina Clark takes it without excessive symptoms.  Patient in for follow-up of elevated cholesterol. Doing well without complaints on current medication. Denies side effects of statin including myalgia and arthralgia and nausea. Also in today for liver function testing. Currently no chest pain, shortness of breath or other cardiovascular related symptoms noted.  Patient continues to have arthritic pain intermittently.  It affects multiple joints.  It causes her to being stiff in the mornings. Depression screen [facility] 2/9 12/22/2017 08/07/2017 06/23/2017 12/21/2016 09/13/2016  Decreased Interest 0 0 1 1 1   Down, Depressed, Hopeless 1 1 1 1 1   PHQ - 2 Score 1 1 2 2 2   Altered sleeping - - 0 0 1  Tired, decreased energy - - 1 1 1   Change in appetite - - 0 0 0  Feeling bad or failure about yourself  - - 0 1 1  Trouble concentrating - - 0 0 0  Moving slowly or fidgety/restless - - 0 0 0  Suicidal thoughts - - 0 0 0  PHQ-9 Score - - 3 4 5   Difficult doing work/chores - - - - Somewhat difficult  Some recent data might be hidden    History Karina Clark has a past medical history of Anxiety, Cataract, Depression, GERD (gastroesophageal reflux disease), Hyperlipidemia, Hypertension, Osteopenia, and Substance abuse ([place]).   Karina Clark has a past surgical history that includes Tubal ligation; Eye surgery; and Nasal septum surgery.   Her family history includes Cancer in her brother; Dementia in her father; Hip fracture in her mother; Hyperlipidemia in her mother; Hypertension in her daughter and father; Neuropathy in her brother; Thyroid disease in her mother.Karina Clark reports that Karina Clark quit smoking about 22 years ago. Her  smoking use included cigarettes. Karina Clark started smoking about 48 years ago. Karina Clark smoked 0.50 packs per day. Karina Clark has never used smokeless tobacco. Karina Clark reports that Karina Clark does not drink alcohol or use drugs.  Current Outpatient Medications on File Prior to Visit  Medication Sig Dispense Refill  . aspirin EC 81 MG tablet Take 81 mg by mouth daily.    Marland Kitchen escitalopram (LEXAPRO) 20 MG tablet Take 1 tablet (20 mg total) by mouth daily. 90 tablet 1  . ketoconazole (NIZORAL) 2 % cream Apply 1 application topically daily.    . Magnesium 250 MG TABS Take by mouth.    . Multiple Vitamins-Minerals (EQ VISION FORMULA 50+) CAPS Take 1 capsule by mouth.    . Omega-3 1000 MG CAPS Take 3 g by mouth daily.     . simvastatin (ZOCOR) 40 MG tablet Take 1 tablet (40 mg total) by mouth daily. 90 tablet 4  . Vitamin D, Cholecalciferol, 1000 UNITS CAPS Take 2,000 Units by mouth daily.     Marland Kitchen zolpidem (AMBIEN) 10 MG tablet Take 1 tablet (10 mg total) by mouth at bedtime. 90 tablet 1   No current facility-administered medications on file prior to visit.     ROS Review of Systems  Constitutional: Negative.   HENT: Negative for congestion.   Eyes: Positive for redness (Karina Clark has a hordeolum underneath the right lid anteriorly). Negative for visual disturbance.  Respiratory: Negative for shortness of breath.  Cardiovascular: Negative for chest pain.  Gastrointestinal: Negative for abdominal pain, constipation, diarrhea, nausea and vomiting.  Genitourinary: Negative for difficulty urinating.  Musculoskeletal: Positive for arthralgias. Negative for myalgias.  Neurological: Negative for headaches.  Psychiatric/Behavioral: Negative for sleep disturbance.    Objective:  BP (!) 104/59   Pulse (!) 55   Temp (!) 97.1 F (36.2 C) (Oral)   Ht 4' 10"  (1.473 m)   Wt 100 lb (45.4 kg)   BMI 20.90 kg/m   BP Readings from Last 3 Encounters:  12/22/17 (!) 104/59  08/07/17 (!) 112/58  06/23/17 125/67    Wt Readings from Last 3  Encounters:  12/22/17 100 lb (45.4 kg)  08/07/17 100 lb (45.4 kg)  06/23/17 100 lb (45.4 kg)     Physical Exam  Constitutional: Karina Clark is oriented to person, place, and time. Karina Clark appears well-developed and well-nourished. No distress.  HENT:  Head: Normocephalic and atraumatic.  Eyes: Pupils are equal, round, and reactive to light.  The inner surface of the palpebral conjunctiva of the left lower lid is noted to have a 1.5 mm stye  Neck: Normal range of motion. Neck supple. No thyromegaly present.  Cardiovascular: Normal rate, regular rhythm and normal heart sounds.  No murmur heard. Pulmonary/Chest: Effort normal and breath sounds normal. No respiratory distress. Karina Clark has no wheezes. Karina Clark has no rales.  Abdominal: Soft. Bowel sounds are normal. Karina Clark exhibits no distension. There is no tenderness.  Musculoskeletal: Normal range of motion.  Lymphadenopathy:    Karina Clark has no cervical adenopathy.  Neurological: Karina Clark is alert and oriented to person, place, and time.  Skin: Skin is warm and dry.  Psychiatric: Karina Clark has a normal mood and affect. Her behavior is normal. Judgment and thought content normal.      Assessment & Plan:   Miyani was seen today for medical management of chronic issues.  Diagnoses and all orders for this visit:  Primary osteoarthritis, unspecified site -     CBC with Differential/Platelet -     VITAMIN D 25 Hydroxy (Vit-D Deficiency, Fractures)  Recurrent major depressive disorder, in full remission ([place])  Anxiety  Mixed hyperlipidemia -     CMP14+EGFR -     Lipid panel  Hordeolum internum left lower eyelid  Other orders -     tobramycin-dexamethasone (TOBRADEX) ophthalmic solution; Apply 1 drop in affected eye(s) every 2 hours for two days. Then every 4 hours for 5 days.   Allergies as of 12/22/2017      Reactions   Mycelex [clotrimazole] Other (See Comments)   Worsened GERD / nausea   Naproxen Other (See Comments)   "Makes my stomach burn"    Sulfamethoxazole-trimethoprim Nausea And Vomiting      Medication List        Accurate as of 12/22/17 11:59 PM. Always use your most recent med list.          aspirin EC 81 MG tablet Take 81 mg by mouth daily.   EQ VISION FORMULA 50+ Caps Take 1 capsule by mouth.   escitalopram 20 MG tablet Commonly known as:  LEXAPRO Take 1 tablet (20 mg total) by mouth daily.   ketoconazole 2 % cream Commonly known as:  NIZORAL Apply 1 application topically daily.   Magnesium 250 MG Tabs Take by mouth.   Omega-3 1000 MG Caps Take 3 g by mouth daily.   simvastatin 40 MG tablet Commonly known as:  ZOCOR Take 1 tablet (40 mg total) by mouth daily.  tobramycin-dexamethasone ophthalmic solution Commonly known as:  TOBRADEX Apply 1 drop in affected eye(s) every 2 hours for two days. Then every 4 hours for 5 days.   Vitamin D (Cholecalciferol) 1000 units Caps Take 2,000 Units by mouth daily.   zolpidem 10 MG tablet Commonly known as:  AMBIEN Take 1 tablet (10 mg total) by mouth at bedtime.       Meds ordered this encounter  Medications  . tobramycin-dexamethasone (TOBRADEX) ophthalmic solution    Sig: Apply 1 drop in affected eye(s) every 2 hours for two days. Then every 4 hours for 5 days.    Dispense:  5 mL    Refill:  0     Follow-up: Return in about 6 months (around 06/24/2018).  Claretta Fraise, M.D.

## 2017-12-23 LAB — LIPID PANEL
Chol/HDL Ratio: 2.5 ratio (ref 0.0–4.4)
Cholesterol, Total: 178 mg/dL (ref 100–199)
HDL: 70 mg/dL (ref >39)
LDL Calculated: 92 mg/dL (ref 0–99)
Triglycerides: 82 mg/dL (ref 0–149)
VLDL CHOLESTEROL CAL: 16 mg/dL (ref 5–40)

## 2017-12-23 LAB — CBC WITH DIFFERENTIAL/PLATELET
BASOS ABS: 0 10*3/uL (ref 0.0–0.2)
Basos: 1 %
EOS (ABSOLUTE): 0.1 10*3/uL (ref 0.0–0.4)
Eos: 1 %
Hematocrit: 31.6 % — ABNORMAL LOW (ref 34.0–46.6)
Hemoglobin: 10.7 g/dL — ABNORMAL LOW (ref 11.1–15.9)
IMMATURE GRANS (ABS): 0 10*3/uL (ref 0.0–0.1)
IMMATURE GRANULOCYTES: 0 %
LYMPHS: 33 %
Lymphocytes Absolute: 2 10*3/uL (ref 0.7–3.1)
MCH: 31.3 pg (ref 26.6–33.0)
MCHC: 33.9 g/dL (ref 31.5–35.7)
MCV: 92 fL (ref 79–97)
MONOCYTES: 8 %
Monocytes Absolute: 0.5 10*3/uL (ref 0.1–0.9)
NEUTROS PCT: 57 %
Neutrophils Absolute: 3.4 10*3/uL (ref 1.4–7.0)
PLATELETS: 207 10*3/uL (ref 150–450)
RBC: 3.42 x10E6/uL — ABNORMAL LOW (ref 3.77–5.28)
RDW: 12.3 % (ref 12.3–15.4)
WBC: 6 10*3/uL (ref 3.4–10.8)

## 2017-12-23 LAB — VITAMIN D 25 HYDROXY (VIT D DEFICIENCY, FRACTURES): VIT D 25 HYDROXY: 50.1 ng/mL (ref 30.0–100.0)

## 2017-12-23 LAB — CMP14+EGFR
ALT: 13 IU/L (ref 0–32)
AST: 18 IU/L (ref 0–40)
Albumin/Globulin Ratio: 2 (ref 1.2–2.2)
Albumin: 4.3 g/dL (ref 3.5–4.8)
Alkaline Phosphatase: 54 IU/L (ref 39–117)
BILIRUBIN TOTAL: 0.3 mg/dL (ref 0.0–1.2)
BUN/Creatinine Ratio: 18 (ref 12–28)
BUN: 15 mg/dL (ref 8–27)
CALCIUM: 9.5 mg/dL (ref 8.7–10.3)
CHLORIDE: 99 mmol/L (ref 96–106)
CO2: 26 mmol/L (ref 20–29)
CREATININE: 0.82 mg/dL (ref 0.57–1.00)
GFR calc non Af Amer: 73 mL/min/{1.73_m2} (ref >59)
GFR, EST AFRICAN AMERICAN: 84 mL/min/{1.73_m2} (ref >59)
GLUCOSE: 87 mg/dL (ref 65–99)
Globulin, Total: 2.1 g/dL (ref 1.5–4.5)
Potassium: 4 mmol/L (ref 3.5–5.2)
Sodium: 138 mmol/L (ref 134–144)
TOTAL PROTEIN: 6.4 g/dL (ref 6.0–8.5)

## 2018-01-02 ENCOUNTER — Other Ambulatory Visit: Payer: Self-pay | Admitting: Family Medicine

## 2018-02-02 IMAGING — DX DG SHOULDER 2+V*R*
3 series · 3 of 3 positions shown · non-contrast
Comparison: None.

CLINICAL DATA: Right shoulder pain without known injury.

EXAM:
RIGHT SHOULDER - 2+ VIEW

[shoulder ap]
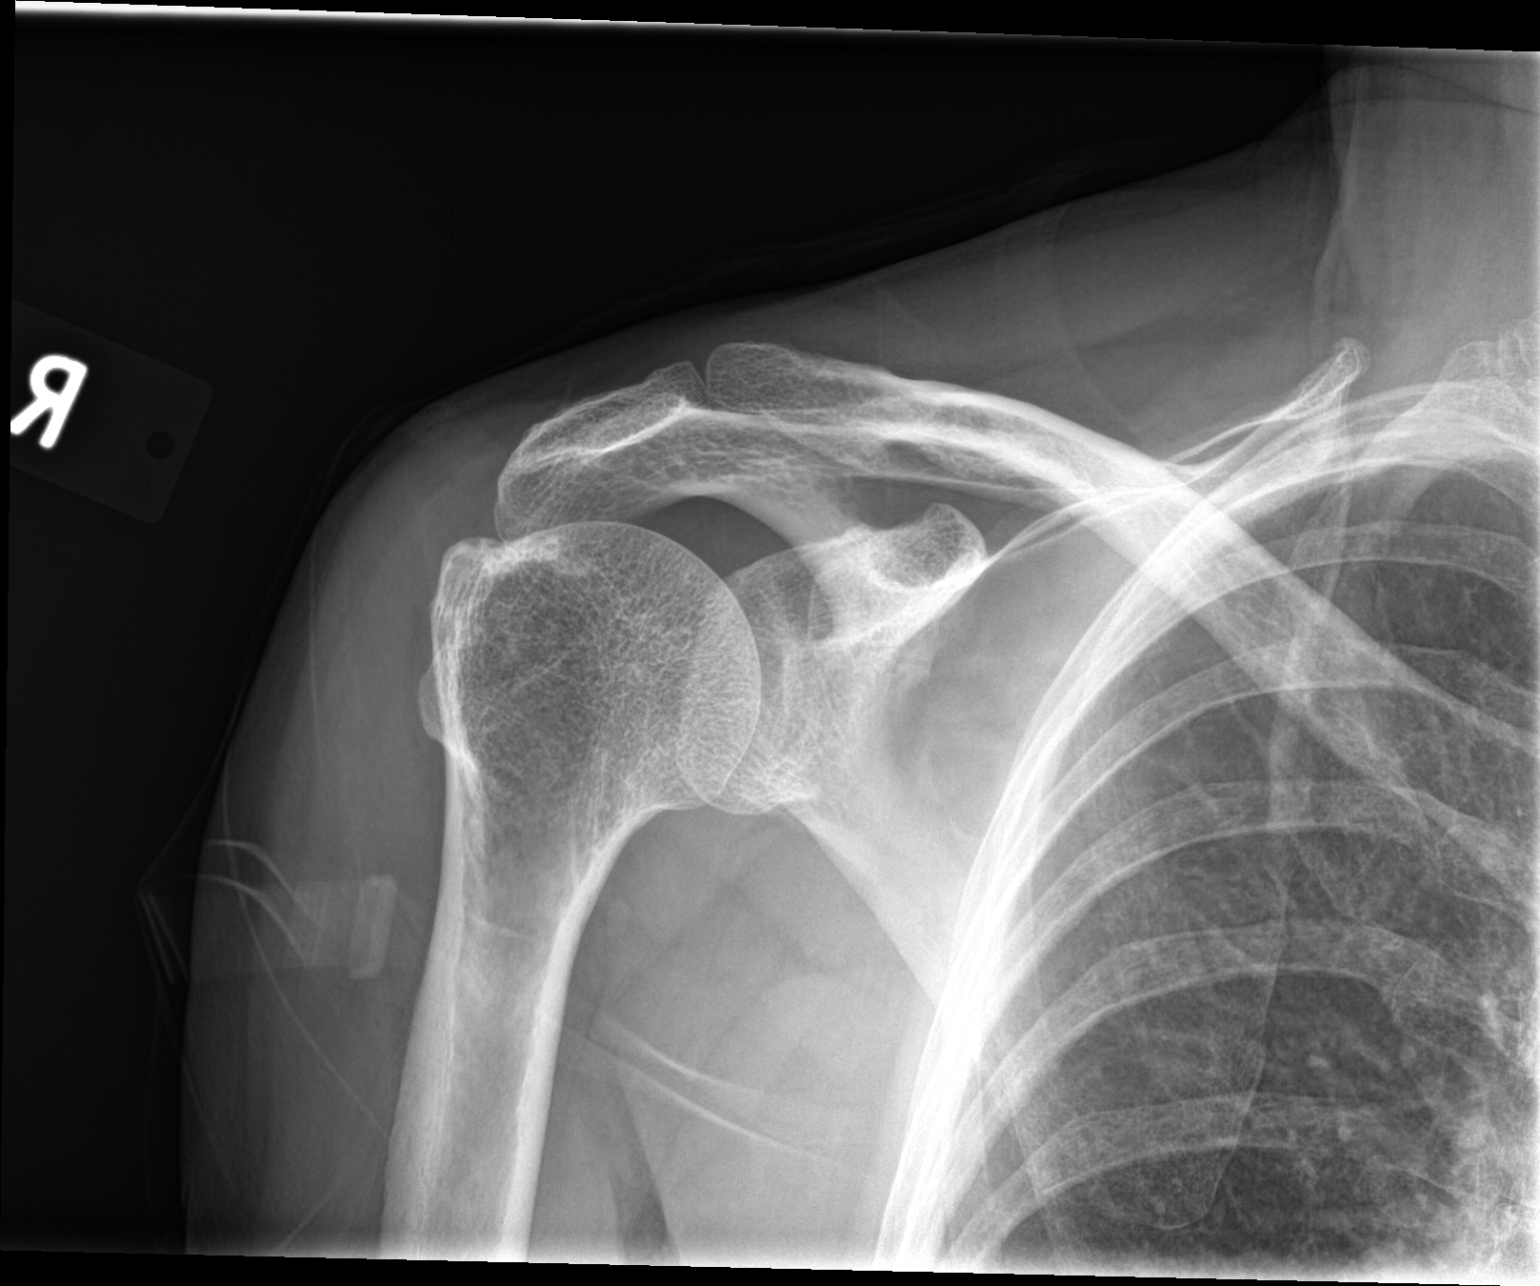

[shoulder obl]
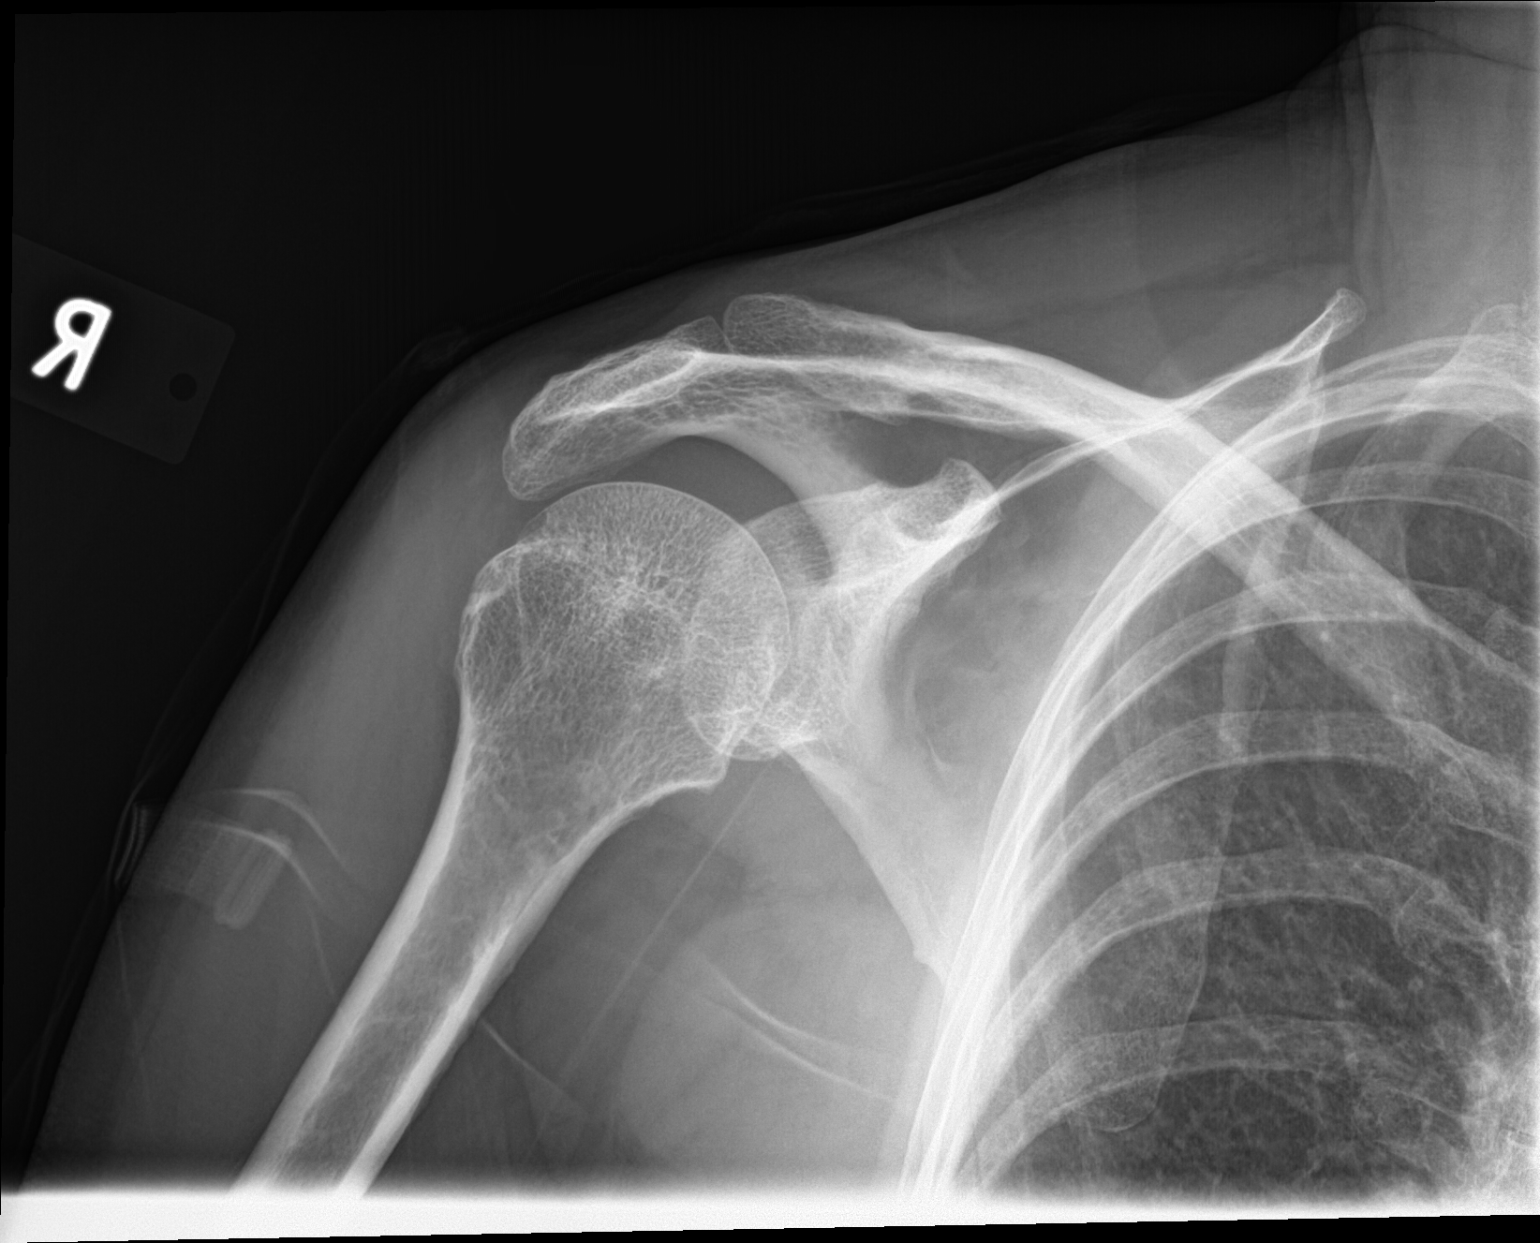

[shoulder axial]
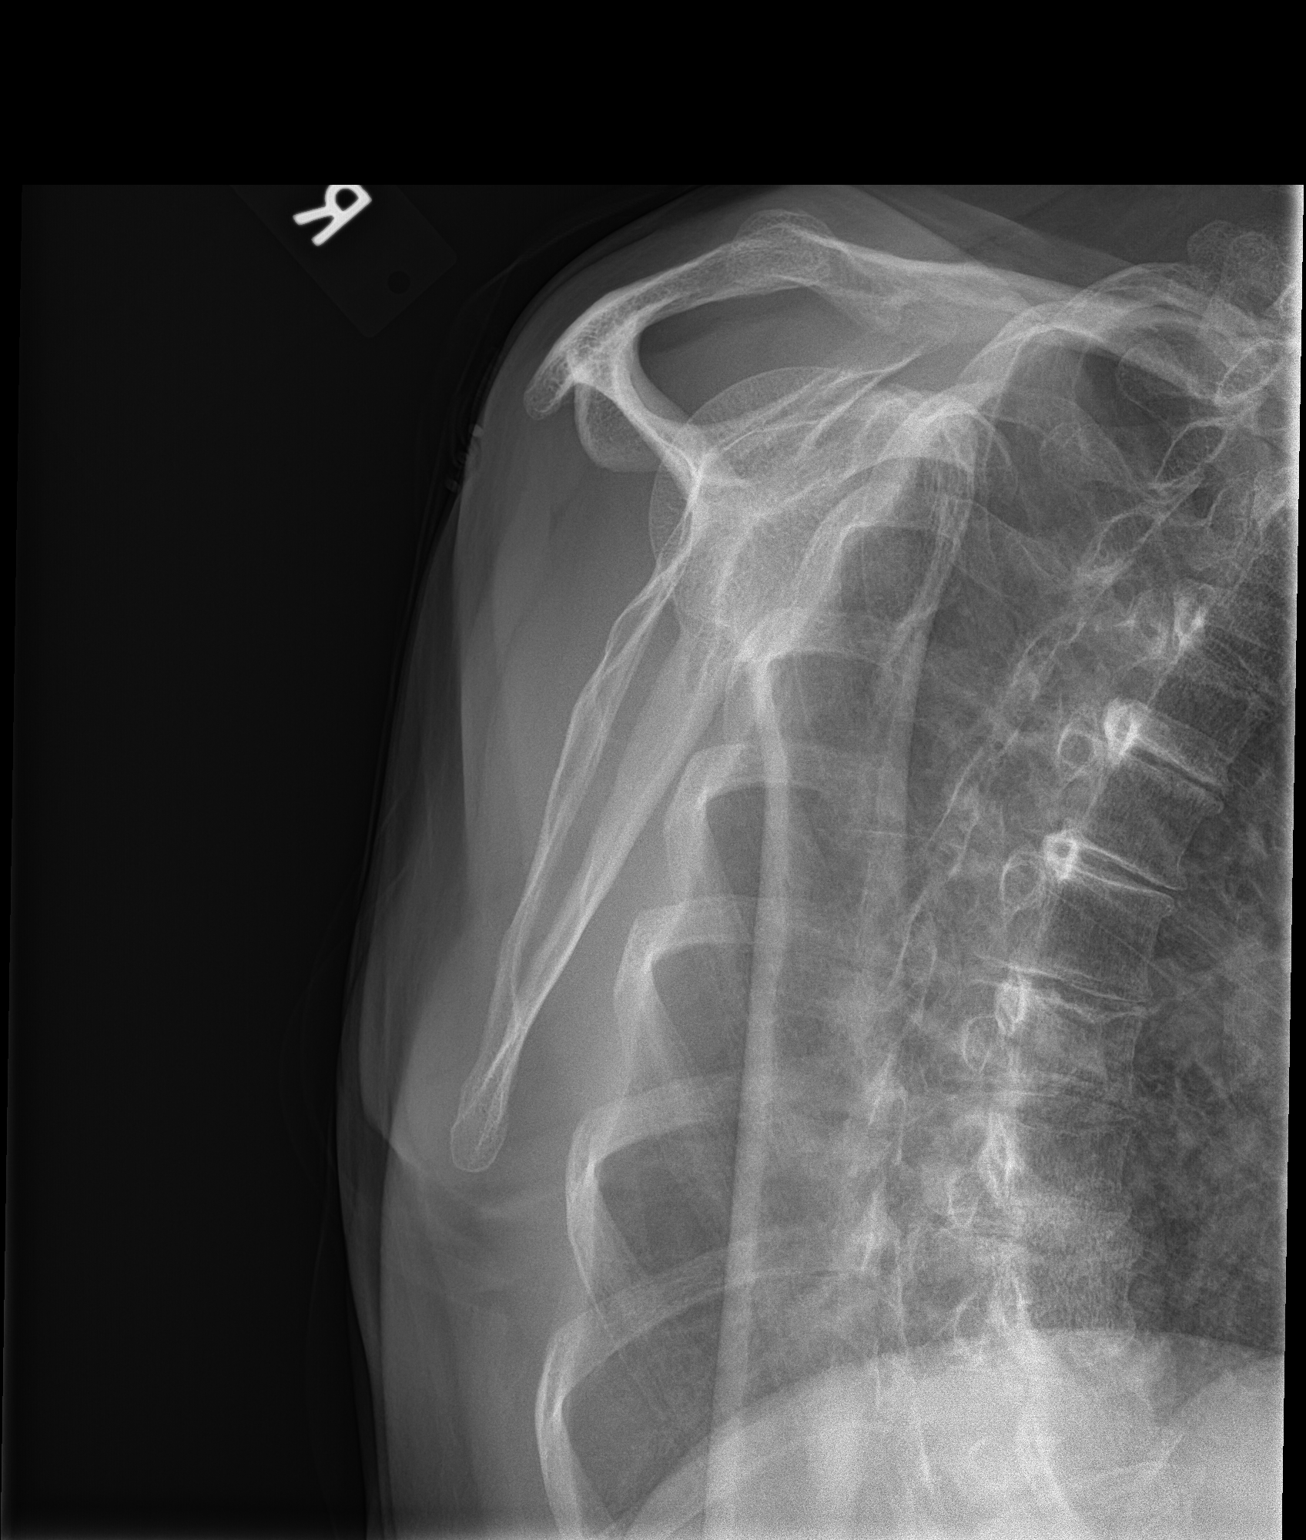

[3 of 3 positions shown; findings below may reference images not displayed]

FINDINGS: There is no evidence of fracture or dislocation. There is no
evidence of arthropathy or other focal bone abnormality. Soft
tissues are unremarkable.
IMPRESSION: Normal right shoulder.

## 2018-02-03 DIAGNOSIS — H10013 Acute follicular conjunctivitis, bilateral: Secondary | ICD-10-CM | POA: Diagnosis not present

## 2018-02-09 ENCOUNTER — Other Ambulatory Visit: Payer: Self-pay | Admitting: Family Medicine

## 2018-03-14 ENCOUNTER — Ambulatory Visit (INDEPENDENT_AMBULATORY_CARE_PROVIDER_SITE_OTHER): Payer: Medicare Other

## 2018-03-14 DIAGNOSIS — Z23 Encounter for immunization: Secondary | ICD-10-CM | POA: Diagnosis not present

## 2018-03-27 ENCOUNTER — Other Ambulatory Visit: Payer: Self-pay | Admitting: Family Medicine

## 2018-04-03 DIAGNOSIS — Z1231 Encounter for screening mammogram for malignant neoplasm of breast: Secondary | ICD-10-CM | POA: Diagnosis not present

## 2018-04-03 LAB — HM MAMMOGRAPHY

## 2018-04-18 DIAGNOSIS — L819 Disorder of pigmentation, unspecified: Secondary | ICD-10-CM | POA: Diagnosis not present

## 2018-04-18 DIAGNOSIS — D485 Neoplasm of uncertain behavior of skin: Secondary | ICD-10-CM | POA: Diagnosis not present

## 2018-04-18 DIAGNOSIS — L280 Lichen simplex chronicus: Secondary | ICD-10-CM | POA: Diagnosis not present

## 2018-04-18 DIAGNOSIS — K130 Diseases of lips: Secondary | ICD-10-CM | POA: Diagnosis not present

## 2018-04-18 DIAGNOSIS — L570 Actinic keratosis: Secondary | ICD-10-CM | POA: Diagnosis not present

## 2018-04-20 ENCOUNTER — Other Ambulatory Visit: Payer: Self-pay | Admitting: Family Medicine

## 2018-04-23 NOTE — Telephone Encounter (Signed)
Last seen 12/22/17

## 2018-05-10 DIAGNOSIS — H40033 Anatomical narrow angle, bilateral: Secondary | ICD-10-CM | POA: Diagnosis not present

## 2018-05-10 DIAGNOSIS — H04123 Dry eye syndrome of bilateral lacrimal glands: Secondary | ICD-10-CM | POA: Diagnosis not present

## 2018-05-14 ENCOUNTER — Other Ambulatory Visit: Payer: Self-pay | Admitting: Family Medicine

## 2018-05-14 MED ORDER — SIMVASTATIN 40 MG PO TABS: ORAL_TABLET | ORAL | 0 refills | Status: DC

## 2018-05-14 NOTE — Telephone Encounter (Signed)
Pt aware refill sent to [facility] 

## 2018-05-15 ENCOUNTER — Telehealth: Payer: Self-pay | Admitting: Family Medicine

## 2018-05-15 NOTE — Telephone Encounter (Signed)
Pt advised ok to continue taking ASA 81mg  unless advised to d/c. Dr Livia Snellen is aware she is taking it and on last office notes states to continue all medications. Pt voiced understanding.

## 2018-06-25 ENCOUNTER — Ambulatory Visit (INDEPENDENT_AMBULATORY_CARE_PROVIDER_SITE_OTHER): Payer: Medicare Other | Admitting: Family Medicine

## 2018-06-25 ENCOUNTER — Encounter: Payer: Self-pay | Admitting: Family Medicine

## 2018-06-25 VITALS — BP 137/70 | HR 62 | Temp 98.1°F | Ht <= 58 in | Wt 102.5 lb

## 2018-06-25 DIAGNOSIS — K2100 Gastro-esophageal reflux disease with esophagitis, without bleeding: Secondary | ICD-10-CM

## 2018-06-25 DIAGNOSIS — K5900 Constipation, unspecified: Secondary | ICD-10-CM

## 2018-06-25 DIAGNOSIS — E782 Mixed hyperlipidemia: Secondary | ICD-10-CM

## 2018-06-25 DIAGNOSIS — Z23 Encounter for immunization: Secondary | ICD-10-CM

## 2018-06-25 MED ORDER — SIMVASTATIN 40 MG PO TABS: ORAL_TABLET | ORAL | 1 refills | Status: DC

## 2018-06-25 MED ORDER — ESCITALOPRAM OXALATE 20 MG PO TABS: ORAL_TABLET | ORAL | 1 refills | Status: DC

## 2018-06-25 MED ORDER — PANTOPRAZOLE SODIUM 40 MG PO TBEC: 40.0000 mg | DELAYED_RELEASE_TABLET | Freq: Every day | ORAL | 11 refills | Status: DC

## 2018-06-25 NOTE — Patient Instructions (Signed)
Metamucil 1 tbsp-4tbsp daily add Miralax as needed

## 2018-06-25 NOTE — Progress Notes (Signed)
Subjective:  Patient ID: Karina Clark, female    DOB: 03/28/1946  Age: 73 y.o. MRN: [id number]  CC: Medical Management of Chronic Issues   HPI Jayani Rozman presents for patient in for follow-up of elevated cholesterol. Doing well without complaints on current medication. Denies side effects of statin including myalgia and arthralgia and nausea. Also in today for liver function testing. Currently no chest pain, shortness of breath or other cardiovascular related symptoms noted.  Patient was told a couple years ago that she should not take PPIs anymore because she had been on them too long.  Since then she has had recurrent reflux and discomfort.  She denies ever having had any reaction to them.  In fact omeprazole worked quite well for her when she was taking it.  Concerned about kidney problems.  Patient would like to have her Shingrix shot after discussion regarding the risk of postherpetic neuralgia.  Depression screen [facility] 2/9 06/25/2018 12/22/2017 08/07/2017  Decreased Interest 0 0 0  Down, Depressed, Hopeless 0 1 1  PHQ - 2 Score 0 1 1  Altered sleeping - - -  Tired, decreased energy - - -  Change in appetite - - -  Feeling bad or failure about yourself  - - -  Trouble concentrating - - -  Moving slowly or fidgety/restless - - -  Suicidal thoughts - - -  PHQ-9 Score - - -  Difficult doing work/chores - - -  Some recent data might be hidden    History Wilder has a past medical history of Anxiety, Cataract, Depression, GERD (gastroesophageal reflux disease), Hyperlipidemia, Hypertension, Osteopenia, and Substance abuse ([place]).   She has a past surgical history that includes Tubal ligation; Eye surgery; and Nasal septum surgery.   Her family history includes Cancer in her brother; Dementia in her father; Hip fracture in her mother; Hyperlipidemia in her mother; Hypertension in her daughter and father; Neuropathy in her brother; Thyroid disease in her mother.She reports that she  quit smoking about 23 years ago. Her smoking use included cigarettes. She started smoking about 49 years ago. She smoked 0.50 packs per day. She has never used smokeless tobacco. She reports that she does not drink alcohol or use drugs.    ROS Review of Systems  Constitutional: Negative.   HENT: Negative for congestion.   Eyes: Negative for visual disturbance.  Respiratory: Negative for shortness of breath.   Cardiovascular: Negative for chest pain.  Gastrointestinal: Positive for abdominal pain (Related to reflux at epigastrium and substernal). Negative for constipation, diarrhea, nausea and vomiting.  Genitourinary: Negative for difficulty urinating.  Musculoskeletal: Negative for arthralgias and myalgias.  Neurological: Negative for headaches.  Psychiatric/Behavioral: Negative for sleep disturbance.    Objective:  BP 137/70   Pulse 62   Temp 98.1 F (36.7 C) (Oral)   Ht '4\' 10"'  (1.473 m)   Wt 102 lb 8 oz (46.5 kg)   BMI 21.42 kg/m   BP Readings from Last 3 Encounters:  06/25/18 137/70  12/22/17 (!) 104/59  08/07/17 (!) 112/58    Wt Readings from Last 3 Encounters:  06/25/18 102 lb 8 oz (46.5 kg)  12/22/17 100 lb (45.4 kg)  08/07/17 100 lb (45.4 kg)     Physical Exam Constitutional:      General: She is not in acute distress.    Appearance: She is well-developed.  HENT:     Head: Normocephalic and atraumatic.     Right Ear: External ear normal.  Left Ear: External ear normal.     Nose: Nose normal.  Eyes:     Conjunctiva/sclera: Conjunctivae normal.     Pupils: Pupils are equal, round, and reactive to light.  Neck:     Musculoskeletal: Normal range of motion and neck supple.     Thyroid: No thyromegaly.  Cardiovascular:     Rate and Rhythm: Normal rate and regular rhythm.     Heart sounds: Normal heart sounds. No murmur.  Pulmonary:     Effort: Pulmonary effort is normal. No respiratory distress.     Breath sounds: Normal breath sounds. No wheezing or  rales.  Abdominal:     General: Bowel sounds are normal. There is no distension.     Palpations: Abdomen is soft.     Tenderness: There is no abdominal tenderness.  Lymphadenopathy:     Cervical: No cervical adenopathy.  Skin:    General: Skin is warm and dry.  Neurological:     Mental Status: She is alert and oriented to person, place, and time.     Deep Tendon Reflexes: Reflexes are normal and symmetric.  Psychiatric:        Behavior: Behavior normal.        Thought Content: Thought content normal.        Judgment: Judgment normal.       Assessment & Plan:   Afomia was seen today for medical management of chronic issues.  Diagnoses and all orders for this visit:  Mixed hyperlipidemia -     CBC with Differential/Platelet -     CMP14+EGFR -     Lipid panel  Gastroesophageal reflux disease with esophagitis  Constipation, unspecified constipation type  Other orders -     escitalopram (LEXAPRO) 20 MG tablet; Take one tablet (20 mg dose) by mouth daily. -     simvastatin (ZOCOR) 40 MG tablet; Take one tablet (40 mg total) by mouth daily. -     Varicella-zoster vaccine IM (Shingrix) -     pantoprazole (PROTONIX) 40 MG tablet; Take 1 tablet (40 mg total) by mouth daily. For stomach       I am having Cresenciano Lick. Colantuono start on pantoprazole. I am also having her maintain her aspirin EC, Vitamin D (Cholecalciferol), Omega-3, ketoconazole, Magnesium, EQ VISION FORMULA 50+, tobramycin-dexamethasone, zolpidem, escitalopram, and simvastatin.  Allergies as of 06/25/2018      Reactions   Mycelex [clotrimazole] Other (See Comments)   Worsened GERD / nausea   Naproxen Other (See Comments)   "Makes my stomach burn"   Sulfamethoxazole-trimethoprim Nausea And Vomiting      Medication List       Accurate as of June 25, 2018  4:38 PM. Always use your most recent med list.        aspirin EC 81 MG tablet Take 81 mg by mouth daily.   EQ VISION FORMULA 50+ Caps Take 1 capsule  by mouth.   escitalopram 20 MG tablet Commonly known as:  LEXAPRO Take one tablet (20 mg dose) by mouth daily.   ketoconazole 2 % cream Commonly known as:  NIZORAL Apply 1 application topically daily.   Magnesium 250 MG Tabs Take by mouth.   Omega-3 1000 MG Caps Take 3 g by mouth daily.   pantoprazole 40 MG tablet Commonly known as:  PROTONIX Take 1 tablet (40 mg total) by mouth daily. For stomach   simvastatin 40 MG tablet Commonly known as:  ZOCOR Take one tablet (40 mg total) by mouth  daily.   tobramycin-dexamethasone ophthalmic solution Commonly known as:  TOBRADEX Apply 1 drop in affected eye(s) every 2 hours for two days. Then every 4 hours for 5 days.   Vitamin D (Cholecalciferol) 25 MCG (1000 UT) Caps Take 2,000 Units by mouth daily.   zolpidem 10 MG tablet Commonly known as:  AMBIEN Take one tablet (10 mg total) by mouth at bedtime.        Follow-up: Return in about 6 months (around 12/24/2018).  Claretta Fraise, M.D.

## 2018-06-26 LAB — CMP14+EGFR
ALT: 16 IU/L (ref 0–32)
AST: 23 IU/L (ref 0–40)
Albumin/Globulin Ratio: 2 (ref 1.2–2.2)
Albumin: 4.5 g/dL (ref 3.7–4.7)
Alkaline Phosphatase: 71 IU/L (ref 39–117)
BILIRUBIN TOTAL: 0.3 mg/dL (ref 0.0–1.2)
BUN/Creatinine Ratio: 16 (ref 12–28)
BUN: 12 mg/dL (ref 8–27)
CALCIUM: 9.6 mg/dL (ref 8.7–10.3)
CHLORIDE: 99 mmol/L (ref 96–106)
CO2: 23 mmol/L (ref 20–29)
Creatinine, Ser: 0.77 mg/dL (ref 0.57–1.00)
GFR calc non Af Amer: 78 mL/min/{1.73_m2} (ref >59)
GFR, EST AFRICAN AMERICAN: 90 mL/min/{1.73_m2} (ref >59)
Globulin, Total: 2.2 g/dL (ref 1.5–4.5)
Glucose: 92 mg/dL (ref 65–99)
POTASSIUM: 4.1 mmol/L (ref 3.5–5.2)
Sodium: 140 mmol/L (ref 134–144)
TOTAL PROTEIN: 6.7 g/dL (ref 6.0–8.5)

## 2018-06-26 LAB — CBC WITH DIFFERENTIAL/PLATELET
BASOS ABS: 0 10*3/uL (ref 0.0–0.2)
Basos: 1 %
EOS (ABSOLUTE): 0.1 10*3/uL (ref 0.0–0.4)
Eos: 1 %
Hematocrit: 34.5 % (ref 34.0–46.6)
Hemoglobin: 11.6 g/dL (ref 11.1–15.9)
IMMATURE GRANS (ABS): 0 10*3/uL (ref 0.0–0.1)
IMMATURE GRANULOCYTES: 0 %
LYMPHS: 28 %
Lymphocytes Absolute: 2.2 10*3/uL (ref 0.7–3.1)
MCH: 31.3 pg (ref 26.6–33.0)
MCHC: 33.6 g/dL (ref 31.5–35.7)
MCV: 93 fL (ref 79–97)
MONOS ABS: 0.7 10*3/uL (ref 0.1–0.9)
Monocytes: 9 %
NEUTROS PCT: 61 %
Neutrophils Absolute: 4.8 10*3/uL (ref 1.4–7.0)
PLATELETS: 257 10*3/uL (ref 150–450)
RBC: 3.71 x10E6/uL — ABNORMAL LOW (ref 3.77–5.28)
RDW: 11.7 % (ref 11.7–15.4)
WBC: 7.7 10*3/uL (ref 3.4–10.8)

## 2018-06-26 LAB — LIPID PANEL
Chol/HDL Ratio: 2.5 ratio (ref 0.0–4.4)
Cholesterol, Total: 200 mg/dL — ABNORMAL HIGH (ref 100–199)
HDL: 79 mg/dL (ref >39)
LDL Calculated: 98 mg/dL (ref 0–99)
TRIGLYCERIDES: 113 mg/dL (ref 0–149)
VLDL CHOLESTEROL CAL: 23 mg/dL (ref 5–40)

## 2018-06-26 NOTE — Progress Notes (Signed)
Hello Karina Clark,  Your lab result is normal.Some minor variations that are not significant are commonly marked abnormal, but do not represent any medical problem for you.  Best regards, Rosa Wyly, M.D.

## 2018-07-06 ENCOUNTER — Encounter: Payer: Self-pay | Admitting: *Deleted

## 2018-07-30 ENCOUNTER — Other Ambulatory Visit: Payer: Self-pay | Admitting: Family Medicine

## 2018-07-30 MED ORDER — ZOLPIDEM TARTRATE 10 MG PO TABS: ORAL_TABLET | ORAL | 0 refills | Status: DC

## 2018-08-20 ENCOUNTER — Ambulatory Visit (INDEPENDENT_AMBULATORY_CARE_PROVIDER_SITE_OTHER): Payer: Medicare Other | Admitting: *Deleted

## 2018-08-20 ENCOUNTER — Other Ambulatory Visit: Payer: Self-pay

## 2018-08-20 DIAGNOSIS — Z23 Encounter for immunization: Secondary | ICD-10-CM

## 2018-08-20 NOTE — Progress Notes (Signed)
Pt given 2nd Shingrix vaccine Tolerated well 

## 2018-10-15 ENCOUNTER — Other Ambulatory Visit: Payer: Self-pay

## 2018-10-15 ENCOUNTER — Ambulatory Visit (INDEPENDENT_AMBULATORY_CARE_PROVIDER_SITE_OTHER): Payer: Medicare Other | Admitting: Family Medicine

## 2018-10-15 DIAGNOSIS — S46011A Strain of muscle(s) and tendon(s) of the rotator cuff of right shoulder, initial encounter: Secondary | ICD-10-CM

## 2018-10-15 MED ORDER — CELECOXIB 200 MG PO CAPS: 200.0000 mg | ORAL_CAPSULE | Freq: Every day | ORAL | 5 refills | Status: AC

## 2018-10-17 ENCOUNTER — Encounter: Payer: Self-pay | Admitting: Family Medicine

## 2018-10-17 NOTE — Progress Notes (Signed)
No chief complaint on file.   HPI  Patient presents today for several days of increasing pain in the right shoulder making it difficult to raise the arm.  She describes abduction with external rotation primarily.  It is hard for her to reach up to comb her hair.  There is numbness weakness or tingling.  The pain is primarily in the ball of the shoulder some radiation into the deltoid region  PMH: Smoking status noted ROS: Per HPI  Objective: There were no vitals taken for this visit. Exam deferred. Pt. [place] due to COVID 19. Phone visit performed.  Assessment and plan:  No diagnosis found.  Meds ordered this encounter  Medications  . celecoxib (CELEBREX) 200 MG capsule    Sig: Take 1 capsule (200 mg total) by mouth daily for 30 days.    Dispense:  30 capsule    Refill:  5    No orders of the defined types were placed in this encounter.   Follow up as needed.  Claretta Fraise, MD

## 2018-10-19 ENCOUNTER — Telehealth: Payer: Self-pay | Admitting: Family Medicine

## 2018-10-19 NOTE — Telephone Encounter (Signed)
Advised pt it can take a little bit before she notices a difference. Pt voiced understanding and states she will continue taking it.

## 2018-10-19 NOTE — Telephone Encounter (Signed)
PT is wanting to speak nurse about celecoxib (CELEBREX) 200 MG capsule wants to know how long it takes before it starts working she started taking it on Tuesday this week

## 2018-10-31 ENCOUNTER — Other Ambulatory Visit: Payer: Self-pay | Admitting: Family Medicine

## 2018-11-01 NOTE — Telephone Encounter (Signed)
Last filled 07/30/18. Has appointment 12/20/2018

## 2018-11-06 ENCOUNTER — Other Ambulatory Visit: Payer: Self-pay | Admitting: *Deleted

## 2018-11-06 MED ORDER — ZOLPIDEM TARTRATE 10 MG PO TABS: ORAL_TABLET | ORAL | 0 refills | Status: DC

## 2018-11-06 NOTE — Telephone Encounter (Signed)
VM needing RF on her Ambien, she has a few left RF on 11/01/18 was set to print instead of normal Please resend

## 2018-11-29 ENCOUNTER — Encounter: Payer: Self-pay | Admitting: *Deleted

## 2018-11-29 ENCOUNTER — Ambulatory Visit (INDEPENDENT_AMBULATORY_CARE_PROVIDER_SITE_OTHER): Payer: Medicare Other | Admitting: *Deleted

## 2018-11-29 ENCOUNTER — Other Ambulatory Visit: Payer: Self-pay

## 2018-11-29 DIAGNOSIS — Z0000 Encounter for general adult medical examination without abnormal findings: Secondary | ICD-10-CM | POA: Diagnosis not present

## 2018-11-29 NOTE — Progress Notes (Signed)
MEDICARE ANNUAL WELLNESS VISIT  11/29/2018  Telephone Visit Disclaimer This Medicare AWV was conducted by telephone due to national recommendations for restrictions regarding the COVID-19 Pandemic (e.g. social distancing).  I verified, using two identifiers, that I am speaking with Karina Clark or their authorized healthcare agent. I discussed the limitations, risks, security, and privacy concerns of performing an evaluation and management service by telephone and the potential availability of an in-person appointment in the future. The patient expressed understanding and agreed to proceed.   Subjective:  Karina Clark is a 73 y.o. female patient of Stacks, Cletus Gash, MD who had a Medicare Annual Wellness Visit today via telephone. Karina Clark is Retired and lives alone. she has 1 child. she reports that she is socially active and does interact with friends/family regularly. she is minimally physically active and enjoys reading, playing candy crush, playing with her cats and watching TV.  Patient Care Team: Claretta Fraise, MD as PCP - General (Family Medicine) Jyl Heinz, MD as Consulting Physician (Endocrinology) Sandford Craze, MD as Referring Physician (Dermatology)  Advanced Directives 11/29/2018 08/07/2017 11/17/2015  Does Patient Have a Medical Advance Directive? Yes Yes No  Type of Advance Directive Living will;Healthcare Power of [place];Living will -  Does patient want to make changes to medical advance directive? No - Patient declined No - Patient declined -  Copy of [place] in Chart? No - copy requested No - copy requested -  Would patient like information on creating a medical advance directive? No - Patient declined - No - patient declined information;Yes - Educational materials given    Hospital Utilization Over the Past 12 Months: # of hospitalizations or ER visits: 0 # of surgeries: 0  Review of Systems     Patient reports that her overall health is unchanged compared to last year.  Patient Reported Readings (BP, Pulse, CBG, Weight, etc) none  Review of Systems: No complaints  All other systems negative.  Pain Assessment Pain : No/denies pain     Current Medications & Allergies (verified) Allergies as of 11/29/2018      Reactions   Mycelex [clotrimazole] Other (See Comments)   Worsened GERD / nausea   Naproxen Other (See Comments)   "Makes my stomach burn"   Sulfamethoxazole-trimethoprim Nausea And Vomiting      Medication List       Accurate as of November 29, 2018 10:25 AM. If you have any questions, ask your nurse or doctor.        STOP taking these medications   Magnesium 250 MG Tabs Stopped by: WYATT, AMY M, RN   tobramycin-dexamethasone ophthalmic solution Commonly known as: TobraDex Stopped by: Denyce Robert, RN     TAKE these medications   aspirin EC 81 MG tablet Take 81 mg by mouth daily.   EQ Vision Formula 50+ Caps Take 1 capsule by mouth.   escitalopram 20 MG tablet Commonly known as: LEXAPRO Take one tablet (20 mg dose) by mouth daily.   ketoconazole 2 % cream Commonly known as: NIZORAL Apply 1 application topically daily.   Omega-3 1000 MG Caps Take 3 g by mouth daily.   pantoprazole 40 MG tablet Commonly known as: PROTONIX Take 1 tablet (40 mg total) by mouth daily. For stomach   simvastatin 40 MG tablet Commonly known as: ZOCOR Take one tablet (40 mg total) by mouth daily.   Vitamin D (Cholecalciferol) 25 MCG (1000 UT) Caps Take 2,000 Units  by mouth daily.   zolpidem 10 MG tablet Commonly known as: AMBIEN TAKE 1 TABLET BY MOUTH AT BEDTIME       History (reviewed): Past Medical History:  Diagnosis Date  . Anxiety   . Cataract   . Depression   . GERD (gastroesophageal reflux disease)   . Hyperlipidemia   . Hypertension   . Osteopenia   . Substance abuse ([place])    DCed cocaine in 1998   Past Surgical History:  Procedure  Laterality Date  . EYE SURGERY    . NASAL SEPTUM SURGERY    . TUBAL LIGATION     Family History  Problem Relation Age of Onset  . Hyperlipidemia Mother   . Thyroid disease Mother   . Hip fracture Mother   . Hypertension Father   . Dementia Father   . Cancer Brother        spine  . Neuropathy Brother   . Hypertension Daughter    Social History   Socioeconomic History  . Marital status: Widowed    Spouse name: Not on file  . Number of children: 1  . Years of education: 47  . Highest education level: 12th grade  Occupational History  . 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 Financial resource strain: Not hard at all  . Food insecurity    Worry: Never true    Inability: Never true  . Transportation needs    Medical: No    Non-medical: No  Tobacco Use  . Smoking status: Former Smoker    Packs/day: 0.50    Types: Cigarettes    Start date: 03/24/1969    Quit date: 02/05/1995    Years since quitting: 23.8  . Smokeless tobacco: Never Used  Substance and Sexual Activity  . Alcohol use: No    Alcohol/week: 0.0 standard drinks  . Drug use: No  . Sexual activity: Not Currently  Lifestyle  . Physical activity    Days per week: 0 days    Minutes per session: 0 min  . Stress: Only a little  Relationships  . Social connections    Talks on phone: More than three times a week    Gets together: More than three times a week    Attends religious service: More than 4 times per year    Active member of club or organization: Yes    Attends meetings of clubs or organizations: More than 4 times per year    Relationship status: Widowed  Other Topics Concern  . Not on file  Social History Narrative   Widowed since 1998. She has one adult daughter and 2 grandchildren. She enjoys reading. She lives in a one story home with steps up onto her deck with a handrail.     Activities of Daily Living In your present state of health, do you  have any difficulty performing the following activities: 11/29/2018  Hearing? N  Vision? Y  Comment wears glasses  Difficulty concentrating or making decisions? N  Walking or climbing stairs? N  Dressing or bathing? N  Doing errands, shopping? N  Preparing Food and eating ? N  Using the Toilet? N  In the past six months, have you accidently leaked urine? Y  Comment has some urge and stress incontinence at times  Do you have problems with loss of bowel control? N  Managing your Medications? N  Managing your Finances? N  Some recent data might  be hidden    Patient Literacy How often do you need to have someone help you when you read instructions, pamphlets, or other written materials from your doctor or pharmacy?: 1 - Never What is the last grade level you completed in school?: 12th grade  Exercise Current Exercise Habits: The patient does not participate in regular exercise at present(pt plans on starting to use her new stationary bike daily), Exercise limited by: None identified  Diet Patient reports consuming 2 meals a day and 1 snack(s) a day Patient reports that her primary diet is: Regular Patient reports that she does have regular access to food.   Depression Screen PHQ 2/9 Scores 11/29/2018 06/25/2018 12/22/2017 08/07/2017 06/23/2017 12/21/2016 09/13/2016  PHQ - 2 Score 0 0 1 1 2 2 2   PHQ- 9 Score - - - - 3 4 5      Fall Risk Fall Risk  11/29/2018 06/25/2018 12/22/2017 08/07/2017 06/23/2017  Falls in the past year? 1 0 No Yes Yes  Number falls in past yr: 0 - - 1 1  Comment - - - - -  Injury with Fall? 0 - - No No  Comment was walking her daughters dog and the dog took out after a squirrel which pulled pt down - - - -  Risk Factor Category  - - - - -  Risk for fall due to : - - - - -  Follow up - - - Falls prevention discussed -     Objective:  Karina Clark seemed alert and oriented and she participated appropriately during our telephone visit.  Blood Pressure Weight BMI   BP Readings from Last 3 Encounters:  06/25/18 137/70  12/22/17 (!) 104/59  08/07/17 (!) 112/58   Wt Readings from Last 3 Encounters:  06/25/18 102 lb 8 oz (46.5 kg)  12/22/17 100 lb (45.4 kg)  08/07/17 100 lb (45.4 kg)   BMI Readings from Last 1 Encounters:  06/25/18 21.42 kg/m    *Unable to obtain current vital signs, weight, and BMI due to telephone visit type  Hearing/Vision  . Guadalupe did not seem to have difficulty with hearing/understanding during the telephone conversation . Reports that she has not had a formal eye exam by an eye care professional within the past year . Reports that she has not had a formal hearing evaluation within the past year *Unable to fully assess hearing and vision during telephone visit type  Cognitive Function: 6CIT Screen 11/29/2018  What Year? 0 points  What month? 0 points  What time? 0 points  Count back from 20 0 points  Months in reverse 2 points  Repeat phrase 0 points  Total Score 2   (Normal:0-7, Significant for Dysfunction: >8)  Normal Cognitive Function Screening: Yes   Immunization & Health Maintenance Record Immunization History  Administered Date(s) Administered  . Influenza, High Dose Seasonal PF 02/23/2016, 02/27/2017, 03/14/2018  . Influenza,inj,Quad PF,6+ Mos 03/05/2015  . Pneumococcal Conjugate-13 11/28/2013  . Pneumococcal Polysaccharide-23 05/09/2012  . Zoster 01/07/2008  . Zoster Recombinat (Shingrix) 06/25/2018, 06/25/2018, 08/20/2018    Health Maintenance  Topic Date Due  . Samul Dada  06/28/2015  . INFLUENZA VACCINE  01/05/2019  . DEXA SCAN  05/09/2019  . MAMMOGRAM  04/03/2020  . COLONOSCOPY  07/26/2023  . Hepatitis C Screening  Completed  . PNA vac Low Risk Adult  Completed       Assessment  This is a routine wellness examination for [place].  Health Maintenance: Due or Overdue  Health Maintenance Due  Topic Date Due  . Samul Dada  06/28/2015    Karina Clark does not need a  referral for Community Assistance: Care Management:   no Social Work:    no Prescription Assistance:  no Nutrition/Diabetes Education:  no   Plan:  Personalized Goals Goals Addressed            This Visit's Progress   . Patient Stated (pt-stated)       "I got a new stationary bike and I would like to use it at least 30 minutes daily"      Personalized Health Maintenance & Screening Recommendations  Td vaccine Advanced directives: has an advanced directive - a copy HAS NOT been provided.  Lung Cancer Screening Recommended: no (Low Dose CT Chest recommended if Age 51-80 years, 30 pack-year currently smoking OR have quit w/in past 15 years) Hepatitis C Screening recommended: no HIV Screening recommended: no  Advanced Directives: Written information was not prepared per patient's request.  Referrals & Orders No orders of the defined types were placed in this encounter.   Follow-up Plan . Follow-up with Claretta Fraise, MD as planned . Consider TDAP vaccine at your next visit with your PCP. Marland Kitchen Bring a copy of your Advanced Directives to your next appointment for our records.    I have personally reviewed and noted the following in the patient's chart:   . Medical and social history . Use of alcohol, tobacco or illicit drugs  . Current medications and supplements . Functional ability and status . Nutritional status . Physical activity . Advanced directives . List of other physicians . Hospitalizations, surgeries, and ER visits in previous 12 months . Vitals . Screenings to include cognitive, depression, and falls . Referrals and appointments  In addition, I have reviewed and discussed with Karina Clark certain preventive protocols, quality metrics, and best practice recommendations. A written personalized care plan for preventive services as well as general preventive health recommendations is available and can be mailed to the patient at her request.       Marylin Crosby, LPN  [id number]

## 2018-11-29 NOTE — Patient Instructions (Signed)
Preventive Care 72 Years and Older, Female Preventive care refers to lifestyle choices and visits with your health care provider that can promote health and wellness. What does preventive care include?  A yearly physical exam. This is also called an annual well check.  Dental exams once or twice a year.  Routine eye exams. Ask your health care provider how often you should have your eyes checked.  Personal lifestyle choices, including: ? Daily care of your teeth and gums. ? Regular physical activity. ? Eating a healthy diet. ? Avoiding tobacco and drug use. ? Limiting alcohol use. ? Practicing safe sex. ? Taking low-dose aspirin every day. ? Taking vitamin and mineral supplements as recommended by your health care provider. What happens during an annual well check? The services and screenings done by your health care provider during your annual well check will depend on your age, overall health, lifestyle risk factors, and family history of disease. Counseling Your health care provider may ask you questions about your:  Alcohol use.  Tobacco use.  Drug use.  Emotional well-being.  Home and relationship well-being.  Sexual activity.  Eating habits.  History of falls.  Memory and ability to understand (cognition).  Work and work Statistician.  Reproductive health.  Screening You may have the following tests or measurements:  Height, weight, and BMI.  Blood pressure.  Lipid and cholesterol levels. These may be checked every 5 years, or more frequently if you are over 30 years old.  Skin check.  Lung cancer screening. You may have this screening every year starting at age 27 if you have a 30-pack-year history of smoking and currently smoke or have quit within the past 15 years.  Colorectal cancer screening. All adults should have this screening starting at age 33 and continuing until age 46. You will have tests every 1-10 years, depending on your results and the  type of screening test. People at increased risk should start screening at an earlier age. Screening tests may include: ? Guaiac-based fecal occult blood testing. ? Fecal immunochemical test (FIT). ? Stool DNA test. ? Virtual colonoscopy. ? Sigmoidoscopy. During this test, a flexible tube with a tiny camera (sigmoidoscope) is used to examine your rectum and lower colon. The sigmoidoscope is inserted through your anus into your rectum and lower colon. ? Colonoscopy. During this test, a long, thin, flexible tube with a tiny camera (colonoscope) is used to examine your entire colon and rectum.  Hepatitis C blood test.  Hepatitis B blood test.  Sexually transmitted disease (STD) testing.  Diabetes screening. This is done by checking your blood sugar (glucose) after you have not eaten for a while (fasting). You may have this done every 1-3 years.  Bone density scan. This is done to screen for osteoporosis. You may have this done starting at age 37.  Mammogram. This may be done every 1-2 years. Talk to your health care provider about how often you should have regular mammograms. Talk with your health care provider about your test results, treatment options, and if necessary, the need for more tests. Vaccines Your health care provider may recommend certain vaccines, such as:  Influenza vaccine. This is recommended every year.  Tetanus, diphtheria, and acellular pertussis (Tdap, Td) vaccine. You may need a Td booster every 10 years.  Varicella vaccine. You may need this if you have not been vaccinated.  Zoster vaccine. You may need this after age 38.  Measles, mumps, and rubella (MMR) vaccine. You may need at least  one dose of MMR if you were born in 1957 or later. You may also need a second dose.  Pneumococcal 13-valent conjugate (PCV13) vaccine. One dose is recommended after age 24.  Pneumococcal polysaccharide (PPSV23) vaccine. One dose is recommended after age 24.  Meningococcal  vaccine. You may need this if you have certain conditions.  Hepatitis A vaccine. You may need this if you have certain conditions or if you travel or work in places where you may be exposed to hepatitis A.  Hepatitis B vaccine. You may need this if you have certain conditions or if you travel or work in places where you may be exposed to hepatitis B.  Haemophilus influenzae type b (Hib) vaccine. You may need this if you have certain conditions. Talk to your health care provider about which screenings and vaccines you need and how often you need them. This information is not intended to replace advice given to you by your health care provider. Make sure you discuss any questions you have with your health care provider. Document Released: 06/19/2015 Document Revised: 07/13/2017 Document Reviewed: 03/24/2015 Elsevier Interactive Patient Education  2019 [employer].

## 2018-12-19 ENCOUNTER — Ambulatory Visit (INDEPENDENT_AMBULATORY_CARE_PROVIDER_SITE_OTHER): Payer: Medicare Other | Admitting: Family Medicine

## 2018-12-19 ENCOUNTER — Encounter: Payer: Self-pay | Admitting: Family Medicine

## 2018-12-19 DIAGNOSIS — K219 Gastro-esophageal reflux disease without esophagitis: Secondary | ICD-10-CM | POA: Diagnosis not present

## 2018-12-19 DIAGNOSIS — E782 Mixed hyperlipidemia: Secondary | ICD-10-CM | POA: Diagnosis not present

## 2018-12-19 DIAGNOSIS — F419 Anxiety disorder, unspecified: Secondary | ICD-10-CM | POA: Diagnosis not present

## 2018-12-19 MED ORDER — PANTOPRAZOLE SODIUM 40 MG PO TBEC: 40.0000 mg | DELAYED_RELEASE_TABLET | Freq: Every day | ORAL | 3 refills | Status: DC

## 2018-12-19 MED ORDER — SIMVASTATIN 40 MG PO TABS: ORAL_TABLET | ORAL | 1 refills | Status: DC

## 2018-12-19 MED ORDER — ESCITALOPRAM OXALATE 20 MG PO TABS: ORAL_TABLET | ORAL | 1 refills | Status: DC

## 2018-12-19 MED ORDER — ZOLPIDEM TARTRATE 10 MG PO TABS: ORAL_TABLET | ORAL | 1 refills | Status: DC

## 2018-12-19 NOTE — Progress Notes (Signed)
Subjective:    Patient ID: Karina Clark, female    DOB: February 12, 1946, 73 y.o.   MRN: [id number]   HPI: Karina Clark is a 73 y.o. female presenting for  presents for  follow-up of insomnia. Denies sleep driving, but has gotten up and eaten and not remembered.   in for follow-up of elevated cholesterol. Doing well without complaints on current medication. Denies side effects of statin including myalgia and arthralgia and nausea. Currently no chest pain, shortness of breath or other cardiovascular related symptoms noted.  Pt. Now remembers injury to the shoulder. Hit by car door when it was rolling. Still hurts off and on.   Depression screen [facility] 2/9 11/29/2018 06/25/2018 12/22/2017 08/07/2017 06/23/2017  Decreased Interest 0 0 0 0 1  Down, Depressed, Hopeless 0 0 1 1 1   PHQ - 2 Score 0 0 1 1 2   Altered sleeping - - - - 0  Tired, decreased energy - - - - 1  Change in appetite - - - - 0  Feeling bad or failure about yourself  - - - - 0  Trouble concentrating - - - - 0  Moving slowly or fidgety/restless - - - - 0  Suicidal thoughts - - - - 0  PHQ-9 Score - - - - 3  Difficult doing work/chores - - - - -  Some recent data might be hidden     Relevant past medical, surgical, family and social history reviewed and updated as indicated.  Interim medical history since our last visit reviewed. Allergies and medications reviewed and updated.  ROS:  Review of Systems  Constitutional: Negative.   HENT: Negative for congestion.   Eyes: Negative for visual disturbance.  Respiratory: Negative for shortness of breath.   Cardiovascular: Negative for chest pain.  Gastrointestinal: Positive for constipation (relief with Metamucil 2 tbsp BID). Negative for abdominal pain, diarrhea, nausea and vomiting.  Genitourinary: Negative for difficulty urinating.  Musculoskeletal: Negative for arthralgias and myalgias.  Neurological: Negative for headaches.  Psychiatric/Behavioral: Negative for sleep  disturbance.     Social History   Tobacco Use  Smoking Status Former Smoker  . Packs/day: 0.50  . Types: Cigarettes  . Start date: 03/24/1969  . Quit date: 02/05/1995  . Years since quitting: 23.8  Smokeless Tobacco Never Used       Objective:     Wt Readings from Last 3 Encounters:  06/25/18 102 lb 8 oz (46.5 kg)  12/22/17 100 lb (45.4 kg)  08/07/17 100 lb (45.4 kg)     Exam deferred. Pt. Harboring due to COVID 19. Phone visit performed.   Assessment & Plan:  No diagnosis found.  No orders of the defined types were placed in this encounter.   No orders of the defined types were placed in this encounter.     There are no diagnoses linked to this encounter.  Virtual Visit via telephone Note  I discussed the limitations, risks, security and privacy concerns of performing an evaluation and management service by telephone and the availability of in person appointments. The patient was identified with two identifiers. Pt.expressed understanding and agreed to proceed. Pt. Is at home. Dr. Livia Snellen is in his office.  Follow Up Instructions:   I discussed the assessment and treatment plan with the patient. The patient was provided an opportunity to ask questions and all were answered. The patient agreed with the plan and demonstrated an understanding of the instructions.   The patient was advised to  call back or seek an in-person evaluation if the symptoms worsen or if the condition fails to improve as anticipated.   Total minutes including chart review and phone contact time: 24   Follow up plan: No follow-ups on file.  Claretta Fraise, MD [place]

## 2018-12-24 ENCOUNTER — Ambulatory Visit: Payer: Medicare Other | Admitting: Family Medicine

## 2019-01-31 ENCOUNTER — Other Ambulatory Visit: Payer: Self-pay

## 2019-02-04 ENCOUNTER — Ambulatory Visit: Payer: Medicare Other

## 2019-02-18 ENCOUNTER — Other Ambulatory Visit: Payer: Self-pay

## 2019-02-19 ENCOUNTER — Ambulatory Visit (INDEPENDENT_AMBULATORY_CARE_PROVIDER_SITE_OTHER): Payer: Medicare Other | Admitting: *Deleted

## 2019-02-19 ENCOUNTER — Other Ambulatory Visit: Payer: Self-pay

## 2019-02-19 DIAGNOSIS — Z23 Encounter for immunization: Secondary | ICD-10-CM

## 2019-04-16 DIAGNOSIS — Z1231 Encounter for screening mammogram for malignant neoplasm of breast: Secondary | ICD-10-CM | POA: Diagnosis not present

## 2019-04-30 ENCOUNTER — Encounter: Payer: Self-pay | Admitting: Family Medicine

## 2019-04-30 ENCOUNTER — Ambulatory Visit (INDEPENDENT_AMBULATORY_CARE_PROVIDER_SITE_OTHER): Payer: Medicare Other | Admitting: Family Medicine

## 2019-04-30 DIAGNOSIS — R509 Fever, unspecified: Secondary | ICD-10-CM | POA: Diagnosis not present

## 2019-04-30 NOTE — Progress Notes (Signed)
Virtual Visit via Telephone Note  I connected with Karina Clark on 04/30/19 at 10:07 AM by telephone and verified that I am speaking with the correct person using two identifiers. Karina Clark is currently located at home and nobody is currently with her during this visit. The provider, Loman Brooklyn, FNP is located in their home at time of visit.  I discussed the limitations, risks, security and privacy concerns of performing an evaluation and management service by telephone and the availability of in person appointments. I also discussed with the patient that there may be a patient responsible charge related to this service. The patient expressed understanding and agreed to proceed.  Subjective: PCP: Claretta Fraise, MD  Chief Complaint  Patient presents with  . Fever   Patient called to report she woke up this morning feeling achy with a scratchy throat and just generally did not feel all that well.  She took her temperature and had a fever of 101.  She has taken Tylenol 1000 mg which has so far reduced it to 100.  Reports she was feeling fine yesterday.   ROS: Per HPI  Current Outpatient Medications:  .  aspirin EC 81 MG tablet, Take 81 mg by mouth daily., Disp: , Rfl:  .  escitalopram (LEXAPRO) 20 MG tablet, Take one tablet (20 mg dose) by mouth daily., Disp: 90 tablet, Rfl: 1 .  ketoconazole (NIZORAL) 2 % cream, Apply 1 application topically daily., Disp: , Rfl:  .  Multiple Vitamins-Minerals (EQ VISION FORMULA 50+) CAPS, Take 1 capsule by mouth., Disp: , Rfl:  .  Omega-3 1000 MG CAPS, Take 3 g by mouth daily. , Disp: , Rfl:  .  pantoprazole (PROTONIX) 40 MG tablet, Take 1 tablet (40 mg total) by mouth daily. For stomach, Disp: 90 tablet, Rfl: 3 .  simvastatin (ZOCOR) 40 MG tablet, Take one tablet (40 mg total) by mouth daily., Disp: 90 tablet, Rfl: 1 .  Vitamin D, Cholecalciferol, 1000 UNITS CAPS, Take 2,000 Units by mouth daily. , Disp: , Rfl:  .  zolpidem (AMBIEN) 10  MG tablet, TAKE 1 TABLET BY MOUTH AT BEDTIME, Disp: 90 tablet, Rfl: 1  Allergies  Allergen Reactions  . Mycelex [Clotrimazole] Other (See Comments)    Worsened GERD / nausea  . Naproxen Other (See Comments)    "Makes my stomach burn"  . Sulfamethoxazole-Trimethoprim Nausea And Vomiting   Past Medical History:  Diagnosis Date  . Anxiety   . Cataract   . Depression   . GERD (gastroesophageal reflux disease)   . Hyperlipidemia   . Hypertension   . Osteopenia   . Substance abuse ([place])    DCed cocaine in 1998    Observations/Objective: A&O  No respiratory distress or wheezing audible over the phone Mood, judgement, and thought processes all WNL  Assessment and Plan: 1. Fever, unspecified fever cause - Encouraged to go get tested for COVID-19.  She reports she does not feel like driving anywhere so she is just going to wait it out and see if she gets worse.  Encouraged her to quarantine herself.  Symptom management.   Follow Up Instructions:  I discussed the assessment and treatment plan with the patient. The patient was provided an opportunity to ask questions and all were answered. The patient agreed with the plan and demonstrated an understanding of the instructions.   The patient was advised to call back or seek an in-person evaluation if the symptoms worsen or if the condition  fails to improve as anticipated.  The above assessment and management plan was discussed with the patient. The patient verbalized understanding of and has agreed to the management plan. Patient is aware to call the clinic if symptoms persist or worsen. Patient is aware when to return to the clinic for a follow-up visit. Patient educated on when it is appropriate to go to the emergency department.   Time call ended: 10:13 AM  I provided 7 minutes of non-face-to-face time during this encounter.  Hendricks Limes, MSN, APRN, FNP-C [facility] 04/30/19

## 2019-07-30 ENCOUNTER — Other Ambulatory Visit: Payer: Self-pay | Admitting: Family Medicine

## 2019-08-21 ENCOUNTER — Other Ambulatory Visit: Payer: Self-pay | Admitting: Family Medicine

## 2019-08-28 ENCOUNTER — Other Ambulatory Visit: Payer: Self-pay | Admitting: *Deleted

## 2019-08-28 MED ORDER — SIMVASTATIN 40 MG PO TABS: ORAL_TABLET | ORAL | 0 refills | Status: DC

## 2019-08-28 NOTE — Telephone Encounter (Signed)
Appt 09/10/19

## 2019-09-04 ENCOUNTER — Other Ambulatory Visit: Payer: Self-pay | Admitting: Family Medicine

## 2019-09-10 ENCOUNTER — Encounter: Payer: Self-pay | Admitting: Family Medicine

## 2019-09-10 ENCOUNTER — Ambulatory Visit (INDEPENDENT_AMBULATORY_CARE_PROVIDER_SITE_OTHER): Payer: Medicare Other | Admitting: Family Medicine

## 2019-09-10 ENCOUNTER — Other Ambulatory Visit: Payer: Self-pay

## 2019-09-10 VITALS — BP 134/72 | HR 53 | Temp 99.5°F | Ht <= 58 in | Wt 102.8 lb

## 2019-09-10 DIAGNOSIS — E782 Mixed hyperlipidemia: Secondary | ICD-10-CM | POA: Diagnosis not present

## 2019-09-10 DIAGNOSIS — M1991 Primary osteoarthritis, unspecified site: Secondary | ICD-10-CM

## 2019-09-10 DIAGNOSIS — E559 Vitamin D deficiency, unspecified: Secondary | ICD-10-CM

## 2019-09-10 DIAGNOSIS — Z79899 Other long term (current) drug therapy: Secondary | ICD-10-CM | POA: Insufficient documentation

## 2019-09-10 DIAGNOSIS — K219 Gastro-esophageal reflux disease without esophagitis: Secondary | ICD-10-CM | POA: Diagnosis not present

## 2019-09-10 DIAGNOSIS — M810 Age-related osteoporosis without current pathological fracture: Secondary | ICD-10-CM | POA: Diagnosis not present

## 2019-09-10 DIAGNOSIS — E042 Nontoxic multinodular goiter: Secondary | ICD-10-CM

## 2019-09-10 DIAGNOSIS — F5101 Primary insomnia: Secondary | ICD-10-CM

## 2019-09-10 MED ORDER — SIMVASTATIN 40 MG PO TABS: ORAL_TABLET | ORAL | 0 refills | Status: DC

## 2019-09-10 MED ORDER — KETOCONAZOLE 2 % EX CREA: 1.0000 "application " | TOPICAL_CREAM | Freq: Every day | CUTANEOUS | 5 refills | Status: DC

## 2019-09-10 MED ORDER — ZOLPIDEM TARTRATE 10 MG PO TABS: ORAL_TABLET | ORAL | 1 refills | Status: DC

## 2019-09-10 MED ORDER — ESCITALOPRAM OXALATE 20 MG PO TABS: ORAL_TABLET | ORAL | 0 refills | Status: DC

## 2019-09-10 MED ORDER — PANTOPRAZOLE SODIUM 40 MG PO TBEC: 40.0000 mg | DELAYED_RELEASE_TABLET | Freq: Every day | ORAL | 3 refills | Status: DC

## 2019-09-10 NOTE — Progress Notes (Signed)
Subjective:  Patient ID: Karina Clark, female    DOB: 13-Jan-1946  Age: 74 y.o. MRN: [id number]  CC: Follow-up   HPI Karina Clark presents for recheck of her chronic medical conditions.  She is dealing with chronic depression which is stable as well as insomnia.  These tend to complicate each other.  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  She is constantly seen supervisors and her mother has to take care of her during the day while her husband is away from home.  This puts a great deal of stress on Karina Clark both from a fall as well as emotional and psychological standpoint.  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  She is due for refills of that.  She is not entirely satisfied with her treatment.  Follow-up reflux patient in for follow-up of GERD. Currently asymptomatic taking  PPI daily. There is no chest pain or heartburn. No hematemesis and no melena. No dysphagia or choking. Onset is remote. Progression is stable. Complicating factors, none.   Patient in for follow-up of elevated cholesterol. Doing well without complaints on current medication. Denies side effects of statin including myalgia and arthralgia and nausea. Also in today for liver function testing. Currently no chest pain, shortness of breath or other cardiovascular related symptoms noted.  Depression screen [facility] 2/9 09/10/2019 11/29/2018 06/25/2018  Decreased Interest 0 0 0  Down, Depressed, Hopeless 0 0 0  PHQ - 2 Score 0 0 0  Altered sleeping - - -  Tired, decreased energy - - -  Change in appetite - - -  Feeling bad or failure about yourself  - - -  Trouble concentrating - - -  Moving slowly or fidgety/restless - - -  Suicidal thoughts - - -  PHQ-9 Score - - -  Difficult doing work/chores - - -  Some recent data might be hidden    History Karina Clark has a past  medical history of Anxiety, Cataract, Depression, GERD (gastroesophageal reflux disease), Hyperlipidemia, Hypertension, Osteopenia, and Substance abuse ([place]).   She has a past surgical history that includes Tubal ligation; Eye surgery; and Nasal septum surgery.   Her family history includes Cancer in her brother; Dementia in her father; Hip fracture in her mother; Hyperlipidemia in her mother; Hypertension in her daughter and father; Neuropathy in her brother; Thyroid disease in her mother.She reports that she quit smoking about 24 years ago. Her smoking use included cigarettes. She started smoking about 50 years ago. She smoked 0.50 packs per day. She has never used smokeless tobacco. She reports that she does not drink alcohol or use drugs.    ROS Review of Systems  Constitutional: Negative.   HENT: Negative for congestion.   Eyes: Negative for visual disturbance.  Respiratory: Negative for shortness of breath.   Cardiovascular: Negative for chest pain.  Gastrointestinal: Negative for abdominal pain, constipation, diarrhea, nausea and vomiting.  Genitourinary: Negative for difficulty urinating.  Musculoskeletal: Negative for arthralgias and myalgias.  Neurological: Negative for headaches.  Psychiatric/Behavioral: Positive for dysphoric mood and sleep disturbance. The patient is nervous/anxious.     Objective:  BP 134/72   Pulse (!) 53   Temp 99.5 F (37.5 C) (Temporal)   Ht '4\' 10"'  (1.473 m)   Wt 102 lb 12.8 oz (46.6 kg)   BMI 21.49 kg/m   BP Readings from Last  3 Encounters:  09/10/19 134/72  06/25/18 137/70  12/22/17 (!) 104/59    Wt Readings from Last 3 Encounters:  09/10/19 102 lb 12.8 oz (46.6 kg)  06/25/18 102 lb 8 oz (46.5 kg)  12/22/17 100 lb (45.4 kg)     Physical Exam Constitutional:      General: She is not in acute distress.    Appearance: She is well-developed.  HENT:     Head: Normocephalic and atraumatic.     Right Ear: External ear normal.     Left  Ear: External ear normal.     Nose: Nose normal.  Eyes:     Conjunctiva/sclera: Conjunctivae normal.     Pupils: Pupils are equal, round, and reactive to light.  Neck:     Thyroid: No thyromegaly.  Cardiovascular:     Rate and Rhythm: Normal rate and regular rhythm.     Heart sounds: Normal heart sounds. No murmur.  Pulmonary:     Effort: Pulmonary effort is normal. No respiratory distress.     Breath sounds: Normal breath sounds. No wheezing or rales.  Abdominal:     General: Bowel sounds are normal. There is no distension.     Palpations: Abdomen is soft.     Tenderness: There is no abdominal tenderness.  Musculoskeletal:     Cervical back: Normal range of motion and neck supple.  Lymphadenopathy:     Cervical: No cervical adenopathy.  Skin:    General: Skin is warm and dry.  Neurological:     General: No focal deficit present.     Mental Status: She is alert and oriented to person, place, and time.     Cranial Nerves: No cranial nerve deficit.     Deep Tendon Reflexes: Reflexes are normal and symmetric. Reflexes normal.  Psychiatric:        Behavior: Behavior normal.        Thought Content: Thought content normal.        Judgment: Judgment normal.       Assessment & Plan:   Karina Clark was seen today for follow-up.  Diagnoses and all orders for this visit:  Gastroesophageal reflux disease without esophagitis -     CBC with Differential/Platelet -     CMP14+EGFR -     pantoprazole (PROTONIX) 40 MG tablet; Take 1 tablet (40 mg total) by mouth daily. For stomach  Mixed hyperlipidemia -     CBC with Differential/Platelet -     CMP14+EGFR -     Lipid panel -     simvastatin (ZOCOR) 40 MG tablet; Take 1 tablet by mouth once daily  Multinodular goiter -     CBC with Differential/Platelet -     CMP14+EGFR  Primary osteoarthritis, unspecified site -     CBC with Differential/Platelet -     CMP14+EGFR  Age-related osteoporosis without current pathological fracture -      CBC with Differential/Platelet -     CMP14+EGFR  Avitaminosis D -     CBC with Differential/Platelet -     CMP14+EGFR -     VITAMIN D 25 Hydroxy (Vit-D Deficiency, Fractures)  Long term prescription benzodiazepine use -     ToxASSURE Select 13 (MW), Urine -     zolpidem (AMBIEN) 10 MG tablet; TAKE 1 TABLET BY MOUTH AT BEDTIME  Controlled substance agreement signed -     zolpidem (AMBIEN) 10 MG tablet; TAKE 1 TABLET BY MOUTH AT BEDTIME  Primary insomnia -     zolpidem (  AMBIEN) 10 MG tablet; TAKE 1 TABLET BY MOUTH AT BEDTIME  Other orders -     escitalopram (LEXAPRO) 20 MG tablet; Take 1 tablet by mouth once daily -     ketoconazole (NIZORAL) 2 % cream; Apply 1 application topically daily.       Karina Clark maintain her aspirin EC, Vitamin D (Cholecalciferol), Omega-3, EQ Vision Formula 50+, escitalopram, ketoconazole, pantoprazole, simvastatin, and zolpidem.  Allergies as of 09/10/2019      Reactions   Mycelex [clotrimazole] Other (See Comments)   Worsened GERD / nausea   Naproxen Other (See Comments)   "Makes my stomach burn"   Sulfamethoxazole-trimethoprim Nausea And Vomiting      Medication List       Accurate as of September 10, 2019  1:57 PM. If you have any questions, ask your nurse or doctor.        aspirin EC 81 MG tablet Take 81 mg by mouth daily.   EQ Vision Formula 50+ Caps Take 1 capsule by mouth.   escitalopram 20 MG tablet Commonly known as: LEXAPRO Take 1 tablet by mouth once daily   ketoconazole 2 % cream Commonly known as: NIZORAL Apply 1 application topically daily.   Omega-3 1000 MG Caps Take 3 g by mouth daily.   pantoprazole 40 MG tablet Commonly known as: PROTONIX Take 1 tablet (40 mg total) by mouth daily. For stomach   simvastatin 40 MG tablet Commonly known as: ZOCOR Take 1 tablet by mouth once daily   Vitamin D (Cholecalciferol) 25 MCG (1000 UT) Caps Take 2,000 Units by mouth daily.   zolpidem 10 MG  tablet Commonly known as: AMBIEN TAKE 1 TABLET BY MOUTH AT BEDTIME      We discussed the thought of weaning her down on the zolpidem.  She is very concerned that she cannot do this at the current time because of her daughter's situation and how it lays on her when she tries to sleep.  She just lays in the ruminates through the night on that unfortunate situation unless she has something to help her to get to sleep.  As result Karina went ahead and executed a controlled substance agreement with her and order the toxassure in addition to refilling her usual medications.  Follow-up: Return in about 6 months (around 03/11/2020).  Claretta Fraise, M.D.

## 2019-09-11 LAB — CBC WITH DIFFERENTIAL/PLATELET
Basophils Absolute: 0.1 10*3/uL (ref 0.0–0.2)
Basos: 1 %
EOS (ABSOLUTE): 0.1 10*3/uL (ref 0.0–0.4)
Eos: 2 %
Hematocrit: 33.5 % — ABNORMAL LOW (ref 34.0–46.6)
Hemoglobin: 11.2 g/dL (ref 11.1–15.9)
Immature Grans (Abs): 0 10*3/uL (ref 0.0–0.1)
Immature Granulocytes: 0 %
Lymphocytes Absolute: 2 10*3/uL (ref 0.7–3.1)
Lymphs: 42 %
MCH: 31.5 pg (ref 26.6–33.0)
MCHC: 33.4 g/dL (ref 31.5–35.7)
MCV: 94 fL (ref 79–97)
Monocytes Absolute: 0.5 10*3/uL (ref 0.1–0.9)
Monocytes: 10 %
Neutrophils Absolute: 2.2 10*3/uL (ref 1.4–7.0)
Neutrophils: 45 %
Platelets: 246 10*3/uL (ref 150–450)
RBC: 3.56 x10E6/uL — ABNORMAL LOW (ref 3.77–5.28)
RDW: 12.5 % (ref 11.7–15.4)
WBC: 4.9 10*3/uL (ref 3.4–10.8)

## 2019-09-11 LAB — LIPID PANEL
Chol/HDL Ratio: 2.6 ratio (ref 0.0–4.4)
Cholesterol, Total: 193 mg/dL (ref 100–199)
HDL: 75 mg/dL (ref >39)
LDL Chol Calc (NIH): 99 mg/dL (ref 0–99)
Triglycerides: 111 mg/dL (ref 0–149)
VLDL Cholesterol Cal: 19 mg/dL (ref 5–40)

## 2019-09-11 LAB — CMP14+EGFR
ALT: 9 IU/L (ref 0–32)
AST: 20 IU/L (ref 0–40)
Albumin/Globulin Ratio: 2 (ref 1.2–2.2)
Albumin: 4.3 g/dL (ref 3.7–4.7)
Alkaline Phosphatase: 82 IU/L (ref 39–117)
BUN/Creatinine Ratio: 12 (ref 12–28)
BUN: 10 mg/dL (ref 8–27)
Bilirubin Total: 0.2 mg/dL (ref 0.0–1.2)
CO2: 23 mmol/L (ref 20–29)
Calcium: 9.5 mg/dL (ref 8.7–10.3)
Chloride: 103 mmol/L (ref 96–106)
Creatinine, Ser: 0.85 mg/dL (ref 0.57–1.00)
GFR calc Af Amer: 79 mL/min/{1.73_m2} (ref >59)
GFR calc non Af Amer: 69 mL/min/{1.73_m2} (ref >59)
Globulin, Total: 2.1 g/dL (ref 1.5–4.5)
Glucose: 85 mg/dL (ref 65–99)
Potassium: 4 mmol/L (ref 3.5–5.2)
Sodium: 143 mmol/L (ref 134–144)
Total Protein: 6.4 g/dL (ref 6.0–8.5)

## 2019-09-11 LAB — VITAMIN D 25 HYDROXY (VIT D DEFICIENCY, FRACTURES): Vit D, 25-Hydroxy: 39.2 ng/mL (ref 30.0–100.0)

## 2019-09-11 NOTE — Progress Notes (Signed)
Hello  Saleena,    Your lab result is normal and/or stable.Some minor variations that are not significant are commonly marked abnormal, but do not represent any medical problem for you.   Best regards,  Korion Cuevas, M.D.

## 2019-09-13 LAB — TOXASSURE SELECT 13 (MW), URINE

## 2019-11-14 ENCOUNTER — Other Ambulatory Visit: Payer: Self-pay | Admitting: Family Medicine

## 2019-12-02 ENCOUNTER — Ambulatory Visit (INDEPENDENT_AMBULATORY_CARE_PROVIDER_SITE_OTHER): Payer: Medicare Other

## 2019-12-02 DIAGNOSIS — Z0000 Encounter for general adult medical examination without abnormal findings: Secondary | ICD-10-CM | POA: Diagnosis not present

## 2019-12-02 NOTE — Progress Notes (Signed)
MEDICARE ANNUAL WELLNESS VISIT  12/02/2019  Telephone Visit Disclaimer This Medicare AWV was conducted by telephone due to national recommendations for restrictions regarding the COVID-19 Pandemic (e.g. social distancing).  I verified, using two identifiers, that I am speaking with Karina Clark or their authorized healthcare agent. I discussed the limitations, risks, security, and privacy concerns of performing an evaluation and management service by telephone and the potential availability of an in-person appointment in the future. The patient expressed understanding and agreed to proceed.   Subjective:  Karina Clark is a 74 y.o. female patient of Stacks, Cletus Gash, MD who had a Medicare Annual Wellness Visit today via telephone. Karina Clark is Retired and lives alone. She has one child and two grandchildren. She reports that she is not socially active and does interact with friends/family regularly.  is minimally physically active and enjoys working in her yard, reading and spending time with her grandchildren.  Patient Care Team: Claretta Fraise, MD as PCP - General (Family Medicine) Jyl Heinz, MD as Consulting Physician (Endocrinology) Sandford Craze, MD as Referring Physician (Dermatology)  Advanced Directives 12/02/2019 11/29/2018 08/07/2017 11/17/2015  Does Patient Have a Medical Advance Directive? Yes Yes Yes No  Type of Advance Directive Healthcare Power of Attorney Living will;Healthcare Power of [place];Living will -  Does patient want to make changes to medical advance directive? No - Patient declined No - Patient declined No - Patient declined -  Copy of [place] in Chart? No - copy requested No - copy requested No - copy requested -  Would patient like information on creating a medical advance directive? - No - Patient declined - No - patient declined information;Yes - Educational materials given    Hospital  Utilization Over the Past 12 Months: # of hospitalizations or ER visits: 0 # of surgeries: 0  Review of Systems    Patient reports that her overall health is unchanged compared to last year.  History obtained from chart review and the patient  Patient Reported Readings (BP, Pulse, CBG, Weight, etc) none  Pain Assessment Pain : No/denies pain     Current Medications & Allergies (verified) Allergies as of 12/02/2019      Reactions   Mycelex [clotrimazole] Other (See Comments)   Worsened GERD / nausea   Naproxen Other (See Comments)   "Makes my stomach burn"   Sulfamethoxazole-trimethoprim Nausea And Vomiting      Medication List       Accurate as of December 02, 2019 10:39 AM. If you have any questions, ask your nurse or doctor.        aspirin EC 81 MG tablet Take 81 mg by mouth daily.   EQ Vision Formula 50+ Caps Take 1 capsule by mouth.   escitalopram 20 MG tablet Commonly known as: LEXAPRO Take 1 tablet by mouth once daily   ketoconazole 2 % cream Commonly known as: NIZORAL Apply 1 application topically daily.   Omega-3 1000 MG Caps Take 3 g by mouth daily.   pantoprazole 40 MG tablet Commonly known as: PROTONIX Take 1 tablet (40 mg total) by mouth daily. For stomach   simvastatin 40 MG tablet Commonly known as: ZOCOR Take 1 tablet by mouth once daily   Vitamin D (Cholecalciferol) 25 MCG (1000 UT) Caps Take 2,000 Units by mouth daily.   zolpidem 10 MG tablet Commonly known as: AMBIEN TAKE 1 TABLET BY MOUTH AT BEDTIME       History (  reviewed): Past Medical History:  Diagnosis Date  . Anxiety   . Cataract   . Depression   . GERD (gastroesophageal reflux disease)   . Hyperlipidemia   . Hypertension   . Osteopenia   . Substance abuse ([place])    DCed cocaine in 1998   Past Surgical History:  Procedure Laterality Date  . EYE SURGERY    . NASAL SEPTUM SURGERY    . TUBAL LIGATION     Family History  Problem Relation Age of Onset  .  Hyperlipidemia Mother   . Thyroid disease Mother   . Hip fracture Mother   . Hypertension Father   . Dementia Father   . Cancer Brother        spine  . Neuropathy Brother   . Hypertension Daughter    Social History   Socioeconomic History  . Marital status: Widowed    Spouse name: Not on file  . Number of children: 1  . Years of education: 42  . Highest education level: 12th grade  Occupational History  . 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 Smoking status: Former Smoker    Packs/day: 0.50    Types: Cigarettes    Start date: 03/24/1969    Quit date: 02/05/1995    Years since quitting: 24.8  . Smokeless tobacco: Never Used  Vaping Use  . Vaping Use: Never used  Substance and Sexual Activity  . Alcohol use: No    Alcohol/week: 0.0 standard drinks  . Drug use: No  . Sexual activity: Not Currently  Other Topics Concern  . Not on file  Social History Narrative   Widowed since 1998. She has one adult daughter and 2 grandchildren. She enjoys reading. She lives in a one story home with steps up onto her deck with a handrail.    Social Determinants of Health   Financial Resource Strain:   . Difficulty of Paying Living Expenses:   Food Insecurity:   . Worried About Charity fundraiser in the Last Year:   . Arboriculturist in the Last Year:   Transportation Needs:   . Film/video editor (Medical):   Marland Kitchen Lack of Transportation (Non-Medical):   Physical Activity:   . Days of Exercise per Week:   . Minutes of Exercise per Session:   Stress:   . Feeling of Stress :   Social Connections:   . Frequency of Communication with Friends and Family:   . Frequency of Social Gatherings with Friends and Family:   . Attends Religious Services:   . Active Member of Clubs or Organizations:   . Attends Archivist Meetings:   Marland Kitchen Marital Status:     Activities of Daily Living In your present state of health, do you have any  difficulty performing the following activities: 12/02/2019  Hearing? N  Vision? N  Difficulty concentrating or making decisions? N  Walking or climbing stairs? N  Dressing or bathing? N  Doing errands, shopping? N  Preparing Food and eating ? N  Using the Toilet? N  In the past six months, have you accidently leaked urine? N  Do you have problems with loss of bowel control? N  Managing your Medications? N  Managing your Finances? N  Housekeeping or managing your Housekeeping? N  Some recent data might be hidden    Patient Education/ Literacy How often do you need to have someone help you  when you read instructions, pamphlets, or other written materials from your doctor or pharmacy?: 1 - Never What is the last grade level you completed in school?: 12th grade  Exercise Current Exercise Habits: Home exercise routine, Type of exercise: walking, Time (Minutes): 30, Frequency (Times/Week): 4, Weekly Exercise (Minutes/Week): 120, Intensity: Mild  Diet Patient reports consuming 2 meals a day and 1 snack(s) a day Patient reports that her primary diet is: Low fat Patient reports that she does have regular access to food.   Depression Screen PHQ 2/9 Scores 12/02/2019 09/10/2019 11/29/2018 06/25/2018 12/22/2017 08/07/2017 06/23/2017  PHQ - 2 Score 0 0 0 0 1 1 2   PHQ- 9 Score - - - - - - 3     Fall Risk Fall Risk  12/02/2019 09/10/2019 11/29/2018 06/25/2018 12/22/2017  Falls in the past year? 0 1 1 0 No  Number falls in past yr: - 1 0 - -  Comment - - - - -  Injury with Fall? - 0 0 - -  Comment - - was walking her daughters dog and the dog took out after a squirrel which pulled pt down - -  Risk Factor Category  - - - - -  Risk for fall due to : - History of fall(s) - - -  Follow up Falls evaluation completed Falls evaluation completed - - -     Objective:  Karina Clark seemed alert and oriented and she participated appropriately during our telephone visit.  Blood Pressure Weight BMI  BP  Readings from Last 3 Encounters:  09/10/19 134/72  06/25/18 137/70  12/22/17 (!) 104/59   Wt Readings from Last 3 Encounters:  09/10/19 102 lb 12.8 oz (46.6 kg)  06/25/18 102 lb 8 oz (46.5 kg)  12/22/17 100 lb (45.4 kg)   BMI Readings from Last 1 Encounters:  09/10/19 21.49 kg/m    *Unable to obtain current vital signs, weight, and BMI due to telephone visit type  Hearing/Vision  . Akaysha did not seem to have difficulty with hearing/understanding during the telephone conversation . Reports that she has had a formal eye exam by an eye care professional within the past year . Reports that she has not had a formal hearing evaluation within the past year *Unable to fully assess hearing and vision during telephone visit type  Cognitive Function: 6CIT Screen 12/02/2019 11/29/2018  What Year? 0 points 0 points  What month? - 0 points  What time? 0 points 0 points  Count back from 20 0 points 0 points  Months in reverse 0 points 2 points  Repeat phrase 0 points 0 points  Total Score - 2   (Normal:0-7, Significant for Dysfunction: >8)  Normal Cognitive Function Screening: Yes   Immunization & Health Maintenance Record Immunization History  Administered Date(s) Administered  . Fluad Quad(high Dose 65+) 02/19/2019  . Influenza, High Dose Seasonal PF 02/23/2016, 02/27/2017, 03/14/2018  . Influenza,inj,Quad PF,6+ Mos 03/05/2015  . Pneumococcal Conjugate-13 11/28/2013  . Pneumococcal Polysaccharide-23 05/09/2012  . Zoster 01/07/2008  . Zoster Recombinat (Shingrix) 06/25/2018, 06/25/2018, 08/20/2018    Health Maintenance  Topic Date Due  . COVID-19 Vaccine (1) Never done  . DEXA SCAN  05/09/2019  . TETANUS/TDAP  09/09/2020 (Originally 06/28/2015)  . INFLUENZA VACCINE  01/05/2020  . MAMMOGRAM  04/15/2021  . COLONOSCOPY  07/26/2023  . Hepatitis C Screening  Completed  . PNA vac Low Risk Adult  Completed       Assessment  This is a routine wellness  examination for Karina Clark.  Health Maintenance: Due or Overdue Health Maintenance Due  Topic Date Due  . COVID-19 Vaccine (1) Never done  . DEXA SCAN  05/09/2019    Karina Clark does not need a referral for Community Assistance: Care Management:   no Social Work:    no Prescription Assistance:  no Nutrition/Diabetes Education:  no   Plan:  Personalized Goals Goals Addressed            This Visit's Progress   . Patient Stated       12/02/2019 AWV Goal: Fall Prevention  . Over the next year, patient will decrease their risk for falls by: o Using assistive devices, such as a cane or walker, as needed o Identifying fall risks within their home and correcting them by: - Removing throw rugs - Adding handrails to stairs or ramps - Removing clutter and keeping a clear pathway throughout the home - Increasing light, especially at night - Adding shower handles/bars - Raising toilet seat o Identifying potential personal risk factors for falls: - Medication side effects - Incontinence/urgency - Vestibular dysfunction - Hearing loss - Musculoskeletal disorders - Neurological disorders - Orthostatic hypotension        Personalized Health Maintenance & Screening Recommendations  Td vaccine Bone densitometry screening  Lung Cancer Screening Recommended: no (Low Dose CT Chest recommended if Age 18-80 years, 30 pack-year currently smoking OR have quit w/in past 15 years) Hepatitis C Screening recommended: no HIV Screening recommended: no  Advanced Directives: Written information was not prepared per patient's request.  Referrals & Orders No orders of the defined types were placed in this encounter.   Follow-up Plan . Follow-up with Claretta Fraise, MD as planned . Schedule Dexa Scan    I have personally reviewed and noted the following in the patient's chart:   . Medical and social history . Use of alcohol, tobacco or illicit drugs  . Current medications and  supplements . Functional ability and status . Nutritional status . Physical activity . Advanced directives . List of other physicians . Hospitalizations, surgeries, and ER visits in previous 12 months . Vitals . Screenings to include cognitive, depression, and falls . Referrals and appointments  In addition, I have reviewed and discussed with Karina Clark certain preventive protocols, quality metrics, and best practice recommendations. A written personalized care plan for preventive services as well as general preventive health recommendations is available and can be mailed to the patient at her request.      Felicity Coyer, LPN    [id number]    After visit summary was not mailed per patient request.

## 2020-02-07 ENCOUNTER — Other Ambulatory Visit: Payer: Self-pay | Admitting: Family Medicine

## 2020-02-12 ENCOUNTER — Other Ambulatory Visit: Payer: Self-pay | Admitting: Family Medicine

## 2020-02-13 ENCOUNTER — Other Ambulatory Visit: Payer: Self-pay | Admitting: Family Medicine

## 2020-02-14 ENCOUNTER — Other Ambulatory Visit: Payer: Self-pay | Admitting: *Deleted

## 2020-02-14 ENCOUNTER — Telehealth: Payer: Self-pay | Admitting: Family Medicine

## 2020-02-14 MED ORDER — ESCITALOPRAM OXALATE 20 MG PO TABS: 20.0000 mg | ORAL_TABLET | Freq: Every day | ORAL | 0 refills | Status: DC

## 2020-02-14 NOTE — Telephone Encounter (Signed)
Medication resent to pharmacy  

## 2020-03-11 ENCOUNTER — Other Ambulatory Visit: Payer: Self-pay | Admitting: Family Medicine

## 2020-03-13 ENCOUNTER — Ambulatory Visit (INDEPENDENT_AMBULATORY_CARE_PROVIDER_SITE_OTHER): Payer: Medicare Other

## 2020-03-13 ENCOUNTER — Other Ambulatory Visit: Payer: Self-pay

## 2020-03-13 DIAGNOSIS — Z23 Encounter for immunization: Secondary | ICD-10-CM

## 2020-04-07 ENCOUNTER — Other Ambulatory Visit: Payer: Self-pay | Admitting: Family Medicine

## 2020-04-07 DIAGNOSIS — E782 Mixed hyperlipidemia: Secondary | ICD-10-CM

## 2020-05-12 ENCOUNTER — Encounter: Payer: Self-pay | Admitting: Family Medicine

## 2020-05-12 ENCOUNTER — Ambulatory Visit (INDEPENDENT_AMBULATORY_CARE_PROVIDER_SITE_OTHER): Payer: Medicare Other | Admitting: Family Medicine

## 2020-05-12 ENCOUNTER — Other Ambulatory Visit: Payer: Self-pay

## 2020-05-12 DIAGNOSIS — E782 Mixed hyperlipidemia: Secondary | ICD-10-CM

## 2020-05-12 DIAGNOSIS — Z79899 Other long term (current) drug therapy: Secondary | ICD-10-CM | POA: Diagnosis not present

## 2020-05-12 DIAGNOSIS — F5101 Primary insomnia: Secondary | ICD-10-CM | POA: Diagnosis not present

## 2020-05-12 MED ORDER — SIMVASTATIN 40 MG PO TABS: ORAL_TABLET | ORAL | 2 refills | Status: DC

## 2020-05-12 MED ORDER — SIMVASTATIN 40 MG PO TABS: ORAL_TABLET | ORAL | 1 refills | Status: DC

## 2020-05-12 MED ORDER — ZOLPIDEM TARTRATE 10 MG PO TABS: ORAL_TABLET | ORAL | 1 refills | Status: DC

## 2020-05-12 MED ORDER — ESCITALOPRAM OXALATE 20 MG PO TABS
20.0000 mg | ORAL_TABLET | Freq: Every day | ORAL | 2 refills | Status: DC
Start: 2020-05-12 — End: 2020-05-12

## 2020-05-12 MED ORDER — ESCITALOPRAM OXALATE 20 MG PO TABS
20.0000 mg | ORAL_TABLET | Freq: Every day | ORAL | 1 refills | Status: DC
Start: 2020-05-12 — End: 2020-11-13

## 2020-05-12 NOTE — Progress Notes (Signed)
Subjective:  Patient ID: Karina Clark, female    DOB: 1945-07-01  Age: 74 y.o. MRN: [id number]  CC: No chief complaint on file.   HPI Karina Clark presents for follow-up of elevated cholesterol. Doing well without complaints on current medication. Denies side effects of statin including myalgia and arthralgia and nausea. Also in today for liver function testing. Currently no chest pain, shortness of breath or other cardiovascular related symptoms noted.  She is upset over her daughter going through ECT for depression.   She has chronic insomnia with depression, both in remission with current meds.  Depression screen [facility] 2/9 05/12/2020 12/02/2019 09/10/2019 11/29/2018 06/25/2018  Decreased Interest 0 0 0 0 0  Down, Depressed, Hopeless 0 0 0 0 0  PHQ - 2 Score 0 0 0 0 0  Altered sleeping - - - - -  Tired, decreased energy - - - - -  Change in appetite - - - - -  Feeling bad or failure about yourself  - - - - -  Trouble concentrating - - - - -  Moving slowly or fidgety/restless - - - - -  Suicidal thoughts - - - - -  PHQ-9 Score - - - - -  Difficult doing work/chores - - - - -  Some recent data might be hidden    History Karina Clark has a past medical history of Anxiety, Cataract, Depression, GERD (gastroesophageal reflux disease), Hyperlipidemia, Hypertension, Osteopenia, and Substance abuse ([place]).   She has a past surgical history that includes Tubal ligation; Eye surgery; and Nasal septum surgery.   Her family history includes Cancer in her brother; Dementia in her father; Hip fracture in her mother; Hyperlipidemia in her mother; Hypertension in her daughter and father; Neuropathy in her brother; Thyroid disease in her mother.She reports that she quit smoking about 25 years ago. Her smoking use included cigarettes. She started smoking about 51 years ago. She smoked 0.50 packs per day. She has never used smokeless tobacco. She reports that she does not drink alcohol and does not use  drugs.  Current Outpatient Medications on File Prior to Visit  Medication Sig Dispense Refill  . aspirin EC 81 MG tablet Take 81 mg by mouth daily.    Marland Kitchen ketoconazole (NIZORAL) 2 % cream Apply 1 application topically daily. 60 g 5  . Multiple Vitamins-Minerals (EQ VISION FORMULA 50+) CAPS Take 1 capsule by mouth.    . Omega-3 1000 MG CAPS Take 3 g by mouth daily.     . pantoprazole (PROTONIX) 40 MG tablet Take 1 tablet (40 mg total) by mouth daily. For stomach 90 tablet 3  . Vitamin D, Cholecalciferol, 1000 UNITS CAPS Take 2,000 Units by mouth daily.      No current facility-administered medications on file prior to visit.    ROS Review of Systems  Constitutional: Negative.   HENT: Negative.   Eyes: Negative for visual disturbance.  Respiratory: Negative for shortness of breath.   Cardiovascular: Negative for chest pain.  Gastrointestinal: Negative for abdominal pain.  Musculoskeletal: Negative for arthralgias.  Psychiatric/Behavioral: Positive for sleep disturbance.    Objective:  BP 126/66   Pulse (!) 53   Temp 98 F (36.7 C) (Temporal)   Resp 20   Ht 4\' 10"  (1.473 m)   Wt 101 lb (45.8 kg)   SpO2 100%   BMI 21.11 kg/m   BP Readings from Last 3 Encounters:  05/12/20 126/66  09/10/19 134/72  06/25/18 137/70  Wt Readings from Last 3 Encounters:  05/12/20 101 lb (45.8 kg)  09/10/19 102 lb 12.8 oz (46.6 kg)  06/25/18 102 lb 8 oz (46.5 kg)     Physical Exam Constitutional:      General: She is not in acute distress.    Appearance: She is well-developed and well-nourished.  HENT:     Head: Normocephalic and atraumatic.  Eyes:     Conjunctiva/sclera: Conjunctivae normal.     Pupils: Pupils are equal, round, and reactive to light.  Neck:     Thyroid: No thyromegaly.  Cardiovascular:     Rate and Rhythm: Normal rate and regular rhythm.     Heart sounds: Normal heart sounds. No murmur heard.   Pulmonary:     Effort: Pulmonary effort is normal. No  respiratory distress.     Breath sounds: Normal breath sounds. No wheezing or rales.  Abdominal:     General: Bowel sounds are normal. There is no distension.     Palpations: Abdomen is soft.     Tenderness: There is no abdominal tenderness.  Musculoskeletal:        General: Normal range of motion.     Cervical back: Normal range of motion and neck supple.  Lymphadenopathy:     Cervical: No cervical adenopathy.  Skin:    General: Skin is warm and dry.  Neurological:     Mental Status: She is alert and oriented to person, place, and time.  Psychiatric:        Mood and Affect: Mood and affect normal.        Behavior: Behavior normal.        Thought Content: Thought content normal.        Judgment: Judgment normal.     No results found for: HGBA1C  Lab Results  Component Value Date   WBC 4.9 09/10/2019   HGB 11.2 09/10/2019   HCT 33.5 (L) 09/10/2019   PLT 246 09/10/2019   GLUCOSE 85 09/10/2019   CHOL 193 09/10/2019   TRIG 111 09/10/2019   HDL 75 09/10/2019   LDLCALC 99 09/10/2019   ALT 9 09/10/2019   AST 20 09/10/2019   NA 143 09/10/2019   K 4.0 09/10/2019   CL 103 09/10/2019   CREATININE 0.85 09/10/2019   BUN 10 09/10/2019   CO2 23 09/10/2019   TSH 2.520 07/23/2015    US SOFT TISSUE HEAD AND NECK  Result Date: 09/19/2016 CLINICAL DATA:  Multinodular goiter follow-up. EXAM: THYROID ULTRASOUND TECHNIQUE: Ultrasound examination of the thyroid gland and adjacent soft tissues was performed. COMPARISON:  Outside report.  No images available. FINDINGS: Parenchymal Echotexture: Mildly heterogenous Isthmus: 0.2 cm in the AP dimension Right lobe: 3.1 x 1.0 x 1.0 cm Left lobe: 3.6 x 0.8 x 1.0 cm _________________________________________________________ Estimated total number of nodules >/= 1 cm: 0 Number of spongiform nodules >/=  2 cm not described below (TR1): 0 Number of mixed cystic and solid nodules >/= 1.5 cm not described below (TR2): 0  _________________________________________________________ Few small nodules in the thyroid tissue. Largest nodule is located in the left mid thyroid lobe and measures up to 0.5 cm. This is a partially cystic and solid nodule. IMPRESSION: Small thyroid nodules. These nodules do not meet criteria for biopsy or dedicated follow-up. The above is in keeping with the ACR TI-RADS recommendations - J Am Coll Radiol 2017;14:587-595. Electronically Signed   By: Markus Daft M.D.   On: 09/19/2016 16:52    Assessment & Plan:  Diagnoses and all orders for this visit:  Mixed hyperlipidemia -     Discontinue: simvastatin (ZOCOR) 40 MG tablet; Take 1 tablet by mouth once daily -     simvastatin (ZOCOR) 40 MG tablet; Take 1 tablet by mouth once daily  Long term prescription benzodiazepine use -     Discontinue: zolpidem (AMBIEN) 10 MG tablet; TAKE 1 TABLET BY MOUTH AT BEDTIME -     zolpidem (AMBIEN) 10 MG tablet; TAKE 1 TABLET BY MOUTH AT BEDTIME  Controlled substance agreement signed -     Discontinue: zolpidem (AMBIEN) 10 MG tablet; TAKE 1 TABLET BY MOUTH AT BEDTIME -     zolpidem (AMBIEN) 10 MG tablet; TAKE 1 TABLET BY MOUTH AT BEDTIME  Primary insomnia -     Discontinue: zolpidem (AMBIEN) 10 MG tablet; TAKE 1 TABLET BY MOUTH AT BEDTIME -     zolpidem (AMBIEN) 10 MG tablet; TAKE 1 TABLET BY MOUTH AT BEDTIME  Other orders -     Discontinue: escitalopram (LEXAPRO) 20 MG tablet; Take 1 tablet (20 mg total) by mouth daily. -     escitalopram (LEXAPRO) 20 MG tablet; Take 1 tablet (20 mg total) by mouth daily.   I am having Cresenciano Lick. Julien Girt "Karle Starch" maintain her aspirin EC, Vitamin D (Cholecalciferol), Omega-3, EQ Vision Formula 50+, ketoconazole, pantoprazole, zolpidem, simvastatin, and escitalopram.  Meds ordered this encounter  Medications  . DISCONTD: simvastatin (ZOCOR) 40 MG tablet    Sig: Take 1 tablet by mouth once daily    Dispense:  30 tablet    Refill:  2  . DISCONTD: zolpidem (AMBIEN) 10  MG tablet    Sig: TAKE 1 TABLET BY MOUTH AT BEDTIME    Dispense:  90 tablet    Refill:  1  . DISCONTD: escitalopram (LEXAPRO) 20 MG tablet    Sig: Take 1 tablet (20 mg total) by mouth daily.    Dispense:  30 tablet    Refill:  2  . zolpidem (AMBIEN) 10 MG tablet    Sig: TAKE 1 TABLET BY MOUTH AT BEDTIME    Dispense:  90 tablet    Refill:  1  . simvastatin (ZOCOR) 40 MG tablet    Sig: Take 1 tablet by mouth once daily    Dispense:  90 tablet    Refill:  1  . escitalopram (LEXAPRO) 20 MG tablet    Sig: Take 1 tablet (20 mg total) by mouth daily.    Dispense:  90 tablet    Refill:  1     Follow-up: Return in about 6 months (around 11/10/2020).  Claretta Fraise, M.D.

## 2020-05-15 ENCOUNTER — Encounter: Payer: Self-pay | Admitting: Family Medicine

## 2020-06-18 ENCOUNTER — Telehealth: Payer: Self-pay | Admitting: Family Medicine

## 2020-06-18 NOTE — Telephone Encounter (Signed)
PAD test done right foot 0.89 / left foot 0.98

## 2020-06-18 NOTE — Telephone Encounter (Signed)
Those represent a mild decline of circulation. Not enough to pursue at this time.

## 2020-06-22 NOTE — Telephone Encounter (Signed)
Lmtcb No call back  This encounter will be closed  

## 2020-07-03 ENCOUNTER — Ambulatory Visit (INDEPENDENT_AMBULATORY_CARE_PROVIDER_SITE_OTHER): Payer: Medicare Other | Admitting: Family Medicine

## 2020-07-03 ENCOUNTER — Encounter: Payer: Self-pay | Admitting: Family Medicine

## 2020-07-03 DIAGNOSIS — J00 Acute nasopharyngitis [common cold]: Secondary | ICD-10-CM | POA: Diagnosis not present

## 2020-07-03 MED ORDER — MOMETASONE FUROATE 50 MCG/ACT NA SUSP: 2.0000 | Freq: Every day | NASAL | 12 refills | Status: DC

## 2020-07-03 NOTE — Progress Notes (Signed)
Telephone visit  Subjective: CC: ?COVID PCP: Claretta Fraise, MD OIN:OMVE Karina Clark is a 75 y.o. female calls for telephone consult today. Patient provides verbal consent for consult held via phone.  Due to COVID-19 pandemic this visit was conducted virtually. This visit type was conducted due to national recommendations for restrictions regarding the COVID-19 Pandemic (e.g. social distancing, sheltering in place) in an effort to limit this patient's exposure and mitigate transmission in our community. All issues noted in this document were discussed and addressed.  A physical exam was not performed with this format.   Location of patient: home Location of provider: [facility] Others present for call: none  1. ?COVID Patient reports that she has been having intermittent sneezing, rhinorrhea, nasal congestion and irritation in throat. This has been present for several days on and off. She is worried about allergies.  She is up to date on all her shots.  She is not having fevers, shortness of breath or diarrhea.  She is using benadryl intermittently.  Doristine Bosworth recently diagnosed with COVID but she hasn't been in very close proximity to him.   ROS: Per HPI  Allergies  Allergen Reactions  . Mycelex [Clotrimazole] Other (See Comments)    Worsened GERD / nausea  . Naproxen Other (See Comments)    "Makes my stomach burn"  . Sulfamethoxazole-Trimethoprim Nausea And Vomiting   Past Medical History:  Diagnosis Date  . Anxiety   . Cataract   . Depression   . GERD (gastroesophageal reflux disease)   . Hyperlipidemia   . Hypertension   . Osteopenia   . Substance abuse ([place])    DCed cocaine in 1998    Current Outpatient Medications:  .  aspirin EC 81 MG tablet, Take 81 mg by mouth daily., Disp: , Rfl:  .  escitalopram (LEXAPRO) 20 MG tablet, Take 1 tablet (20 mg total) by mouth daily., Disp: 90 tablet, Rfl: 1 .  ketoconazole (NIZORAL) 2 % cream, Apply 1 application topically daily., Disp: 60 g,  Rfl: 5 .  Multiple Vitamins-Minerals (EQ VISION FORMULA 50+) CAPS, Take 1 capsule by mouth., Disp: , Rfl:  .  Omega-3 1000 MG CAPS, Take 3 g by mouth daily. , Disp: , Rfl:  .  pantoprazole (PROTONIX) 40 MG tablet, Take 1 tablet (40 mg total) by mouth daily. For stomach, Disp: 90 tablet, Rfl: 3 .  simvastatin (ZOCOR) 40 MG tablet, Take 1 tablet by mouth once daily, Disp: 90 tablet, Rfl: 1 .  Vitamin D, Cholecalciferol, 1000 UNITS CAPS, Take 2,000 Units by mouth daily. , Disp: , Rfl:  .  zolpidem (AMBIEN) 10 MG tablet, TAKE 1 TABLET BY MOUTH AT BEDTIME, Disp: 90 tablet, Rfl: 1  Assessment/ Plan: 75 y.o. female   Acute rhinitis - Plan: mometasone (NASONEX) 50 MCG/ACT nasal spray, Novel Coronavirus, NAA ([facility])  Given questionable exposure to COVID-19, we have ordered Covid virus swab.  I have given her Nasonex to help with the nasal congestion, rhinorrhea and allergy symptoms she is experiencing.  She will follow-up if symptoms worsen or she develops any other worrisome symptoms or signs  Start time: 12:27pm End time: 12:33pm  Total time spent on patient care (including telephone call/ virtual visit): 6 minutes  [place], [place] [phone]

## 2020-07-05 LAB — NOVEL CORONAVIRUS, NAA: SARS-CoV-2, NAA: NOT DETECTED

## 2020-07-05 LAB — SARS-COV-2, NAA 2 DAY TAT

## 2020-07-22 ENCOUNTER — Ambulatory Visit: Payer: Medicare Other | Admitting: Family Medicine

## 2020-07-28 ENCOUNTER — Encounter: Payer: Self-pay | Admitting: Family Medicine

## 2020-07-28 ENCOUNTER — Ambulatory Visit (INDEPENDENT_AMBULATORY_CARE_PROVIDER_SITE_OTHER): Payer: Medicare Other | Admitting: Family Medicine

## 2020-07-28 ENCOUNTER — Other Ambulatory Visit: Payer: Self-pay

## 2020-07-28 VITALS — BP 151/70 | HR 53 | Temp 97.7°F | Resp 20 | Ht 59.0 in | Wt 101.0 lb

## 2020-07-28 DIAGNOSIS — E782 Mixed hyperlipidemia: Secondary | ICD-10-CM | POA: Diagnosis not present

## 2020-07-28 DIAGNOSIS — R6889 Other general symptoms and signs: Secondary | ICD-10-CM | POA: Diagnosis not present

## 2020-07-28 DIAGNOSIS — K5903 Drug induced constipation: Secondary | ICD-10-CM

## 2020-07-28 MED ORDER — ATORVASTATIN CALCIUM 20 MG PO TABS: 20.0000 mg | ORAL_TABLET | Freq: Every day | ORAL | 1 refills | Status: DC

## 2020-07-28 MED ORDER — LINACLOTIDE 290 MCG PO CAPS: 290.0000 ug | ORAL_CAPSULE | Freq: Every day | ORAL | 2 refills | Status: DC

## 2020-07-28 MED ORDER — POLYETHYLENE GLYCOL 3350 17 GM/SCOOP PO POWD: 17.0000 g | Freq: Two times a day (BID) | ORAL | 5 refills | Status: DC | PRN

## 2020-07-28 NOTE — Progress Notes (Signed)
Subjective:  Patient ID: Karina Clark, female    DOB: September 29, 1945  Age: 75 y.o. MRN: [id number]  CC: Constipation (Potential S/E from statin )   HPI Karina Clark presents for concern for abnormal ABI .  She had [facility] screening and brings in a paper showing that it was abnormal but it does not show to what degree.  The right was mild to moderate and the left was normal to mild but no numbers were provided.  She would like to have this further evaluated.  She does have some pain with walking.  Also chronic constipation. Has to use a stimulant laxative for al bowel. They work well. Also get bloated and interferes with appetite;Marland Kitchen   Depression screen [facility] 2/9 07/28/2020 05/12/2020 12/02/2019  Decreased Interest 0 0 0  Down, Depressed, Hopeless 0 0 0  PHQ - 2 Score 0 0 0  Altered sleeping - - -  Tired, decreased energy - - -  Change in appetite - - -  Feeling bad or failure about yourself  - - -  Trouble concentrating - - -  Moving slowly or fidgety/restless - - -  Suicidal thoughts - - -  PHQ-9 Score - - -  Difficult doing work/chores - - -  Some recent data might be hidden    History [name] has a past medical history of Anxiety, Cataract, Depression, GERD (gastroesophageal reflux disease), Hyperlipidemia, Hypertension, Osteopenia, and Substance abuse ([place]).   She has a past surgical history that includes Tubal ligation; Eye surgery; and Nasal septum surgery.   Her family history includes Cancer in her brother; Dementia in her father; Hip fracture in her mother; Hyperlipidemia in her mother; Hypertension in her daughter and father; Neuropathy in her brother; Thyroid disease in her mother.She reports that she quit smoking about 25 years ago. Her smoking use included cigarettes. She started smoking about 51 years ago. She smoked 0.50 packs per day. She has never used smokeless tobacco. She reports that she does not drink alcohol and does not use drugs.    ROS Review of Systems   Constitutional: Negative.   HENT: Negative.   Eyes: Negative for visual disturbance.  Respiratory: Negative for shortness of breath.   Cardiovascular: Negative for chest pain.  Gastrointestinal: Positive for abdominal distention and constipation. Negative for abdominal pain.  Musculoskeletal: Negative for arthralgias.    Objective:  BP (!) 151/70   Pulse (!) 53   Temp 97.7 F (36.5 C)   Resp 20   Ht 4\' 11"  (1.499 m)   Wt 101 lb (45.8 kg)   SpO2 100%   BMI 20.40 kg/m   BP Readings from Last 3 Encounters:  07/28/20 (!) 151/70  05/12/20 126/66  09/10/19 134/72    Wt Readings from Last 3 Encounters:  07/28/20 101 lb (45.8 kg)  05/12/20 101 lb (45.8 kg)  09/10/19 102 lb 12.8 oz (46.6 kg)     Physical Exam Constitutional:      General: She is not in acute distress.    Appearance: She is well-developed.  HENT:     Head: Normocephalic and atraumatic.  Eyes:     Conjunctiva/sclera: Conjunctivae normal.     Pupils: Pupils are equal, round, and reactive to light.  Neck:     Thyroid: No thyromegaly.  Cardiovascular:     Rate and Rhythm: Normal rate and regular rhythm.     Heart sounds: Normal heart sounds. No murmur heard.   Pulmonary:     Effort: Pulmonary  effort is normal. No respiratory distress.     Breath sounds: Normal breath sounds. No wheezing or rales.  Abdominal:     General: Bowel sounds are normal. There is no distension.     Palpations: Abdomen is soft.     Tenderness: There is no abdominal tenderness.  Musculoskeletal:        General: Normal range of motion.     Cervical back: Normal range of motion and neck supple.  Lymphadenopathy:     Cervical: No cervical adenopathy.  Skin:    General: Skin is warm and dry.  Neurological:     Mental Status: She is alert and oriented to person, place, and time.  Psychiatric:        Behavior: Behavior normal.        Thought Content: Thought content normal.        Judgment: Judgment normal.        Assessment & Plan:   Burdette was seen today for constipation.  Diagnoses and all orders for this visit:  Abnormal ankle brachial index (ABI) -     Ambulatory referral to Vascular Surgery  Drug-induced constipation  Mixed hyperlipidemia  Other orders -     linaclotide (LINZESS) 290 MCG CAPS capsule; Take 1 capsule (290 mcg total) by mouth daily. To regulate bowel movements -     atorvastatin (LIPITOR) 20 MG tablet; Take 1 tablet (20 mg total) by mouth daily. For cholesterol -     polyethylene glycol powder (GLYCOLAX/MIRALAX) 17 GM/SCOOP powder; Take 17 g by mouth 2 (two) times daily as needed for moderate constipation. For constipation       I have discontinued Cresenciano Lick. Perfetti "[name]"'s simvastatin. I have also changed her linaclotide. Additionally, I am having her start on atorvastatin and polyethylene glycol powder. Lastly, I am having her maintain her aspirin EC, Vitamin D (Cholecalciferol), Omega-3, EQ Vision Formula 50+, ketoconazole, pantoprazole, zolpidem, escitalopram, and mometasone.  Allergies as of 07/28/2020      Reactions   Mycelex [clotrimazole] Other (See Comments)   Worsened GERD / nausea   Naproxen Other (See Comments)   "Makes my stomach burn"   Sulfamethoxazole-trimethoprim Nausea And Vomiting      Medication List       Accurate as of July 28, 2020  6:25 PM. If you have any questions, ask your nurse or doctor.        STOP taking these medications   simvastatin 40 MG tablet Commonly known as: ZOCOR Stopped by: Claretta Fraise, MD     TAKE these medications   aspirin EC 81 MG tablet Take 81 mg by mouth daily.   atorvastatin 20 MG tablet Commonly known as: LIPITOR Take 1 tablet (20 mg total) by mouth daily. For cholesterol Started by: Claretta Fraise, MD   EQ Vision Formula 50+ Caps Take 1 capsule by mouth.   escitalopram 20 MG tablet Commonly known as: LEXAPRO Take 1 tablet (20 mg total) by mouth daily.   ketoconazole 2 %  cream Commonly known as: NIZORAL Apply 1 application topically daily.   linaclotide 290 MCG Caps capsule Commonly known as: Linzess Take 1 capsule (290 mcg total) by mouth daily. To regulate bowel movements Started by: Claretta Fraise, MD   mometasone 50 MCG/ACT nasal spray Commonly known as: Nasonex Place 2 sprays into the nose daily.   Omega-3 1000 MG Caps Take 3 g by mouth daily.   pantoprazole 40 MG tablet Commonly known as: PROTONIX Take 1 tablet (40 mg total)  by mouth daily. For stomach   polyethylene glycol powder 17 GM/SCOOP powder Commonly known as: GLYCOLAX/MIRALAX Take 17 g by mouth 2 (two) times daily as needed for moderate constipation. For constipation Started by: Claretta Fraise, MD   Vitamin D (Cholecalciferol) 25 MCG (1000 UT) Caps Take 2,000 Units by mouth daily.   zolpidem 10 MG tablet Commonly known as: AMBIEN TAKE 1 TABLET BY MOUTH AT BEDTIME        Follow-up: Return in about 6 weeks (around 09/08/2020).  Claretta Fraise, M.D.

## 2020-08-10 ENCOUNTER — Other Ambulatory Visit (HOSPITAL_COMMUNITY): Payer: Self-pay | Admitting: Vascular Surgery

## 2020-08-10 ENCOUNTER — Other Ambulatory Visit: Payer: Self-pay

## 2020-08-10 ENCOUNTER — Ambulatory Visit (INDEPENDENT_AMBULATORY_CARE_PROVIDER_SITE_OTHER): Payer: Medicare Other

## 2020-08-10 ENCOUNTER — Encounter: Payer: Self-pay | Admitting: Vascular Surgery

## 2020-08-10 ENCOUNTER — Ambulatory Visit: Payer: Medicare Other | Admitting: Vascular Surgery

## 2020-08-10 VITALS — BP 123/58 | HR 57 | Temp 98.1°F | Resp 14 | Ht 59.0 in | Wt 101.0 lb

## 2020-08-10 DIAGNOSIS — R6889 Other general symptoms and signs: Secondary | ICD-10-CM | POA: Diagnosis not present

## 2020-08-10 NOTE — Progress Notes (Signed)
[facility] [place]  Patient name: Myelle Poteat MRN: [id number] DOB: June 18, 1945 Sex: female  REASON FOR CONSULT: Discuss abnormal screening study  HPI: Kiona Blume is a 75 y.o. female, who is here today for discussion of abnormal screening study.  She reports that she had a visiting nurse from [place] [facility] in her home and underwent lower extremity arterial screening which was abnormal.  She is seeing me for further evaluation and discussion of this.  She is in good health.  She has no cardiac disease.  She quit smoking in 1996.  She has no history of claudication type symptoms and no history of lower extremity tissue loss.  Past Medical History:  Diagnosis Date  . Anxiety   . Cataract   . Depression   . GERD (gastroesophageal reflux disease)   . Hyperlipidemia   . Hypertension   . Osteopenia   . Substance abuse ([place])    DCed cocaine in 1998    Family History  Problem Relation Age of Onset  . Hyperlipidemia Mother   . Thyroid disease Mother   . Hip fracture Mother   . Hypertension Father   . Dementia Father   . Cancer Brother        spine  . Neuropathy Brother   . Hypertension Daughter     SOCIAL HISTORY: Social History   Socioeconomic History  . Marital status: Widowed    Spouse name: Not on file  . Number of children: 1  . Years of education: 12  . Highest education level: 12th grade  Occupational History  . 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 Smoking status: Former Smoker    Packs/day: 0.50    Types: Cigarettes    Start date: 03/24/1969    Quit date: 02/05/1995    Years since quitting: 25.5  . Smokeless tobacco: Never Used  Vaping Use  . Vaping Use: Never used  Substance and Sexual Activity  . Alcohol use: No    Alcohol/week: 0.0 standard drinks  . Drug use: No  . Sexual activity: Not Currently  Other Topics Concern  . Not on file   Social History Narrative   Widowed since 1998. She has one adult daughter and 2 grandchildren. She enjoys reading. She lives in a one story home with steps up onto her deck with a handrail.    Social Determinants of Health   Financial Resource Strain: Not on file  Food Insecurity: Not on file  Transportation Needs: Not on file  Physical Activity: Not on file  Stress: Not on file  Social Connections: Not on file  Intimate Partner Violence: Not on file    Allergies  Allergen Reactions  . Mycelex [Clotrimazole] Other (See Comments)    Worsened GERD / nausea  . Naproxen Other (See Comments)    "Makes my stomach burn"  . Sulfamethoxazole-Trimethoprim Nausea And Vomiting    Current Outpatient Medications  Medication Sig Dispense Refill  . aspirin EC 81 MG tablet Take 81 mg by mouth daily.    Marland Kitchen atorvastatin (LIPITOR) 20 MG tablet Take 1 tablet (20 mg total) by mouth daily. For cholesterol 90 tablet 1  . escitalopram (LEXAPRO) 20 MG tablet Take 1 tablet (20 mg total) by mouth daily. 90 tablet 1  . ketoconazole (NIZORAL) 2 % cream Apply 1 application topically daily. 60 g 5  . linaclotide (LINZESS) 290 MCG CAPS capsule Take 1 capsule (  290 mcg total) by mouth daily. To regulate bowel movements 30 capsule 2  . mometasone (NASONEX) 50 MCG/ACT nasal spray Place 2 sprays into the nose daily. 1 each 12  . Multiple Vitamins-Minerals (EQ VISION FORMULA 50+) CAPS Take 1 capsule by mouth.    . Omega-3 1000 MG CAPS Take 3 g by mouth daily.     . pantoprazole (PROTONIX) 40 MG tablet Take 1 tablet (40 mg total) by mouth daily. For stomach 90 tablet 3  . polyethylene glycol powder (GLYCOLAX/MIRALAX) 17 GM/SCOOP powder Take 17 g by mouth 2 (two) times daily as needed for moderate constipation. For constipation 3350 g 5  . Vitamin D, Cholecalciferol, 1000 UNITS CAPS Take 2,000 Units by mouth daily.     Marland Kitchen zolpidem (AMBIEN) 10 MG tablet TAKE 1 TABLET BY MOUTH AT BEDTIME 90 tablet 1   No current  facility-administered medications for this visit.    REVIEW OF SYSTEMS:  [X]  denotes positive finding, [ ]  denotes negative finding Cardiac  Comments:  Chest pain or chest pressure:    Shortness of breath upon exertion:    Short of breath when lying flat:    Irregular heart rhythm:        Vascular    Pain in calf, thigh, or hip brought on by ambulation:    Pain in feet at night that wakes you up from your sleep:     Blood clot in your veins:    Leg swelling:         Pulmonary    Oxygen at home:    Productive cough:     Wheezing:         Neurologic    Sudden weakness in arms or legs:     Sudden numbness in arms or legs:     Sudden onset of difficulty speaking or slurred speech:    Temporary loss of vision in one eye:     Problems with dizziness:         Gastrointestinal    Blood in stool:     Vomited blood:         Genitourinary    Burning when urinating:     Blood in urine:        Psychiatric    Major depression:         Hematologic    Bleeding problems:    Problems with blood clotting too easily:        Skin    Rashes or ulcers:        Constitutional    Fever or chills:      PHYSICAL EXAM: Vitals:   08/10/20 1437  BP: (!) 123/58  Pulse: (!) 57  Resp: 14  Temp: 98.1 F (36.7 C)  TempSrc: Temporal  SpO2: 99%  Weight: 101 lb (45.8 kg)  Height: 4\' 11"  (1.499 m)    GENERAL: The patient is a well-nourished female, in no acute distress. The vital signs are documented above. CARDIOVASCULAR: 2+ radial and 2+ posterior tibial pulses bilaterally.  Carotid arteries without bruits bilaterally. PULMONARY: There is good air exchange  MUSCULOSKELETAL: There are no major deformities or cyanosis. NEUROLOGIC: No focal weakness or paresthesias are detected. SKIN: There are no ulcers or rashes noted. PSYCHIATRIC: The patient has a normal affect.  DATA:  She underwent noninvasive arterial studies in our office which revealed normal ankle arm index and normal  triphasic waveforms bilaterally  MEDICAL ISSUES: I discussed these findings at length with the patient.  I explained  that she does not have any symptoms that would suggest arterial insufficiency.  She has a normal physical exam and normal lower extremity noninvasive studies in our office.  She has had a false positive in her screen and I would not recommend any further evaluation or treatment.  She was reassured with this discussion will see [place] again as needed   Rosetta Posner, MD [facility] [phone] Pager [phone]  Note: Portions of this report may have been transcribed using voice recognition software.  Every effort has been made to ensure accuracy; however, inadvertent computerized transcription errors may still be present.

## 2020-09-03 ENCOUNTER — Other Ambulatory Visit: Payer: Self-pay | Admitting: Family Medicine

## 2020-09-03 DIAGNOSIS — Z1231 Encounter for screening mammogram for malignant neoplasm of breast: Secondary | ICD-10-CM

## 2020-09-07 ENCOUNTER — Encounter: Payer: Medicare Other | Admitting: Vascular Surgery

## 2020-09-11 ENCOUNTER — Other Ambulatory Visit: Payer: Self-pay | Admitting: Family Medicine

## 2020-09-11 DIAGNOSIS — K219 Gastro-esophageal reflux disease without esophagitis: Secondary | ICD-10-CM

## 2020-09-18 ENCOUNTER — Other Ambulatory Visit: Payer: Self-pay | Admitting: Family Medicine

## 2020-09-18 DIAGNOSIS — K219 Gastro-esophageal reflux disease without esophagitis: Secondary | ICD-10-CM

## 2020-09-30 ENCOUNTER — Other Ambulatory Visit: Payer: Self-pay

## 2020-09-30 ENCOUNTER — Ambulatory Visit
Admission: RE | Admit: 2020-09-30 | Discharge: 2020-09-30 | Disposition: A | Discharge status: Home / Self Care | Payer: Medicare Other | Source: Ambulatory Visit | Attending: Family Medicine | Admitting: Family Medicine

## 2020-09-30 DIAGNOSIS — Z1231 Encounter for screening mammogram for malignant neoplasm of breast: Secondary | ICD-10-CM | POA: Diagnosis not present

## 2020-11-10 ENCOUNTER — Ambulatory Visit: Payer: Medicare Other | Admitting: Family Medicine

## 2020-11-13 ENCOUNTER — Other Ambulatory Visit: Payer: Self-pay | Admitting: Family Medicine

## 2020-11-18 ENCOUNTER — Encounter: Payer: Self-pay | Admitting: Family Medicine

## 2020-11-18 ENCOUNTER — Other Ambulatory Visit: Payer: Self-pay

## 2020-11-18 ENCOUNTER — Ambulatory Visit (INDEPENDENT_AMBULATORY_CARE_PROVIDER_SITE_OTHER): Payer: Medicare Other | Admitting: Family Medicine

## 2020-11-18 VITALS — BP 127/68 | HR 58 | Temp 98.0°F | Ht 59.0 in | Wt 101.2 lb

## 2020-11-18 DIAGNOSIS — K219 Gastro-esophageal reflux disease without esophagitis: Secondary | ICD-10-CM

## 2020-11-18 DIAGNOSIS — F5101 Primary insomnia: Secondary | ICD-10-CM | POA: Diagnosis not present

## 2020-11-18 DIAGNOSIS — E782 Mixed hyperlipidemia: Secondary | ICD-10-CM | POA: Diagnosis not present

## 2020-11-18 DIAGNOSIS — Z79899 Other long term (current) drug therapy: Secondary | ICD-10-CM

## 2020-11-18 MED ORDER — PANTOPRAZOLE SODIUM 40 MG PO TBEC: DELAYED_RELEASE_TABLET | ORAL | 1 refills | Status: DC

## 2020-11-18 MED ORDER — ATORVASTATIN CALCIUM 20 MG PO TABS: 20.0000 mg | ORAL_TABLET | Freq: Every day | ORAL | 1 refills | Status: DC

## 2020-11-18 MED ORDER — KETOCONAZOLE 2 % EX CREA: 1.0000 "application " | TOPICAL_CREAM | Freq: Every day | CUTANEOUS | 5 refills | Status: DC

## 2020-11-18 MED ORDER — ZOLPIDEM TARTRATE 10 MG PO TABS: ORAL_TABLET | ORAL | 1 refills | Status: DC

## 2020-11-18 MED ORDER — ESCITALOPRAM OXALATE 20 MG PO TABS: 1.0000 | ORAL_TABLET | Freq: Every day | ORAL | 1 refills | Status: DC

## 2020-11-18 NOTE — Progress Notes (Signed)
Subjective:  Patient ID: Karina Clark, female    DOB: [birth year]  Age: 75 y.o. MRN: [id number]  CC: Medical Management of Chronic Issues   HPI Karina Clark presents for follow-up of elevated cholesterol. Doing well without complaints on current medication. Denies side effects of statin including myalgia and arthralgia and nausea. Also insomnia dependent on ambien. CSA executed. Drug screen to be drawn.   Patient in for follow-up of GERD. Currently asymptomatic taking  PPI daily. There is no chest pain or heartburn. No hematemesis and no melena. No dysphagia or choking. Onset is remote. Progression is stable. Complicating factors, none. Depression screen [facility] 2/9 11/18/2020 11/18/2020 07/28/2020 05/12/2020 12/02/2019  Decreased Interest 1 0 0 0 0  Down, Depressed, Hopeless 1 0 0 0 0  PHQ - 2 Score 2 0 0 0 0  Altered sleeping 0 - - - -  Tired, decreased energy 1 - - - -  Change in appetite 0 - - - -  Feeling bad or failure about yourself  0 - - - -  Trouble concentrating 0 - - - -  Moving slowly or fidgety/restless 0 - - - -  Suicidal thoughts 0 - - - -  PHQ-9 Score 3 - - - -  Difficult doing work/chores Not difficult at all - - - -  Some recent data might be hidden     History Karina Clark has a past medical history of Anxiety, Cataract, Depression, GERD (gastroesophageal reflux disease), Hyperlipidemia, Hypertension, Osteopenia, and Substance abuse ([place]).   She has a past surgical history that includes Tubal ligation; Eye surgery; and Nasal septum surgery.   Her family history includes Cancer in her brother; Dementia in her father; Hip fracture in her mother; Hyperlipidemia in her mother; Hypertension in her daughter and father; Neuropathy in her brother; Thyroid disease in her mother.She reports that she quit smoking about 25 years ago. Her smoking use included cigarettes. She started smoking about 51 years ago. She smoked an average of 0.50 packs per day. She has never used smokeless  tobacco. She reports that she does not drink alcohol and does not use drugs.  Current Outpatient Medications on File Prior to Visit  Medication Sig Dispense Refill   aspirin EC 81 MG tablet Take 81 mg by mouth daily.     mometasone (NASONEX) 50 MCG/ACT nasal spray Place 2 sprays into the nose daily. 1 each 12   Multiple Vitamins-Minerals (EQ VISION FORMULA 50+) CAPS Take 1 capsule by mouth.     Omega-3 1000 MG CAPS Take 3 g by mouth daily.      polyethylene glycol powder (GLYCOLAX/MIRALAX) 17 GM/SCOOP powder Take 17 g by mouth 2 (two) times daily as needed for moderate constipation. For constipation 3350 g 5   Vitamin D, Cholecalciferol, 1000 UNITS CAPS Take 2,000 Units by mouth daily.      No current facility-administered medications on file prior to visit.    ROS Review of Systems  Constitutional: Negative.   HENT: Negative.    Eyes:  Negative for visual disturbance.  Respiratory:  Negative for shortness of breath.   Cardiovascular:  Negative for chest pain.  Gastrointestinal:  Negative for abdominal pain.  Musculoskeletal:  Negative for arthralgias.   Objective:  BP 127/68   Pulse (!) 58   Temp 98 F (36.7 C)   Ht 4' 11" (1.499 m)   Wt 101 lb 3.2 oz (45.9 kg)   SpO2 99%   BMI 20.44 kg/m   BP  Readings from Last 3 Encounters:  11/18/20 127/68  08/10/20 (!) 123/58  07/28/20 (!) 151/70    Wt Readings from Last 3 Encounters:  11/18/20 101 lb 3.2 oz (45.9 kg)  08/10/20 101 lb (45.8 kg)  07/28/20 101 lb (45.8 kg)     Physical Exam Constitutional:      General: She is not in acute distress.    Appearance: She is well-developed.  HENT:     Head: Normocephalic and atraumatic.  Eyes:     Conjunctiva/sclera: Conjunctivae normal.     Pupils: Pupils are equal, round, and reactive to light.  Neck:     Thyroid: No thyromegaly.  Cardiovascular:     Rate and Rhythm: Normal rate and regular rhythm.     Heart sounds: Normal heart sounds. No murmur heard. Pulmonary:      Effort: Pulmonary effort is normal. No respiratory distress.     Breath sounds: Normal breath sounds. No wheezing or rales.  Abdominal:     General: Bowel sounds are normal. There is no distension.     Palpations: Abdomen is soft.     Tenderness: There is no abdominal tenderness.  Musculoskeletal:        General: Normal range of motion.     Cervical back: Normal range of motion and neck supple.  Lymphadenopathy:     Cervical: No cervical adenopathy.  Skin:    General: Skin is warm and dry.  Neurological:     Mental Status: She is alert and oriented to person, place, and time.  Psychiatric:        Behavior: Behavior normal.        Thought Content: Thought content normal.        Judgment: Judgment normal.    No results found for: HGBA1C  Lab Results  Component Value Date   WBC 4.9 09/10/2019   HGB 11.2 09/10/2019   HCT 33.5 (L) 09/10/2019   PLT 246 09/10/2019   GLUCOSE 85 09/10/2019   CHOL 193 09/10/2019   TRIG 111 09/10/2019   HDL 75 09/10/2019   LDLCALC 99 09/10/2019   ALT 9 09/10/2019   AST 20 09/10/2019   NA 143 09/10/2019   K 4.0 09/10/2019   CL 103 09/10/2019   CREATININE 0.85 09/10/2019   BUN 10 09/10/2019   CO2 23 09/10/2019   TSH 2.520 07/23/2015    MM 3D SCREEN BREAST BILATERAL  Result Date: 10/01/2020 CLINICAL DATA:  Screening. EXAM: DIGITAL SCREENING BILATERAL MAMMOGRAM WITH TOMOSYNTHESIS AND CAD TECHNIQUE: Bilateral screening digital craniocaudal and mediolateral oblique mammograms were obtained. Bilateral screening digital breast tomosynthesis was performed. The images were evaluated with computer-aided detection. COMPARISON:  Previous exam(s). ACR Breast Density Category c: The breast tissue is heterogeneously dense, which may obscure small masses. FINDINGS: There are no findings suspicious for malignancy. The images were evaluated with computer-aided detection. IMPRESSION: No mammographic evidence of malignancy. A result letter of this screening mammogram  will be mailed directly to the patient. RECOMMENDATION: Screening mammogram in one year. (Code:SM-B-01Y) BI-RADS CATEGORY  1: Negative. Electronically Signed   By: Ammie Ferrier M.D.   On: 10/01/2020 15:25    Assessment & Plan:   Karina Clark was seen today for medical management of chronic issues.  Diagnoses and all orders for this visit:  Mixed hyperlipidemia -     CBC with Differential/Platelet -     CMP14+EGFR -     Lipid panel  Controlled substance agreement signed -     ToxASSURE Select 13 (MW),  Urine -     zolpidem (AMBIEN) 10 MG tablet; TAKE 1 TABLET BY MOUTH AT BEDTIME -     Drug Screen 10 W/Conf, Se  Long term prescription benzodiazepine use -     zolpidem (AMBIEN) 10 MG tablet; TAKE 1 TABLET BY MOUTH AT BEDTIME  Primary insomnia -     zolpidem (AMBIEN) 10 MG tablet; TAKE 1 TABLET BY MOUTH AT BEDTIME  Gastroesophageal reflux disease without esophagitis -     pantoprazole (PROTONIX) 40 MG tablet; TAKE 1 TABLET BY MOUTH ONCE DAILY FOR  STOMACH  Other orders -     escitalopram (LEXAPRO) 20 MG tablet; Take 1 tablet (20 mg total) by mouth daily. -     atorvastatin (LIPITOR) 20 MG tablet; Take 1 tablet (20 mg total) by mouth daily. For cholesterol -     ketoconazole (NIZORAL) 2 % cream; Apply 1 application topically daily.  I have discontinued Cresenciano Lick. Pickup "Chelsae Jane"'s linaclotide. I have also changed her escitalopram. Additionally, I am having her maintain her aspirin EC, Vitamin D (Cholecalciferol), Omega-3, EQ Vision Formula 50+, mometasone, polyethylene glycol powder, zolpidem, atorvastatin, pantoprazole, and ketoconazole.  Meds ordered this encounter  Medications   escitalopram (LEXAPRO) 20 MG tablet    Sig: Take 1 tablet (20 mg total) by mouth daily.    Dispense:  90 tablet    Refill:  1   zolpidem (AMBIEN) 10 MG tablet    Sig: TAKE 1 TABLET BY MOUTH AT BEDTIME    Dispense:  90 tablet    Refill:  1   atorvastatin (LIPITOR) 20 MG tablet    Sig: Take 1 tablet (20  mg total) by mouth daily. For cholesterol    Dispense:  90 tablet    Refill:  1   pantoprazole (PROTONIX) 40 MG tablet    Sig: TAKE 1 TABLET BY MOUTH ONCE DAILY FOR  STOMACH    Dispense:  90 tablet    Refill:  1   ketoconazole (NIZORAL) 2 % cream    Sig: Apply 1 application topically daily.    Dispense:  60 g    Refill:  5     Follow-up: No follow-ups on file.  Claretta Fraise, M.D.

## 2020-11-19 NOTE — Progress Notes (Signed)
Hello  Melenie,    Your lab result is normal and/or stable.Some minor variations that are not significant are commonly marked abnormal, but do not represent any medical problem for you.   Best regards,  Rondel Episcopo, M.D.

## 2020-11-21 LAB — DRUG SCREEN 10 W/CONF, SERUM
Amphetamines, IA: NEGATIVE ng/mL
Barbiturates, IA: NEGATIVE ug/mL
Benzodiazepines, IA: NEGATIVE ng/mL
Cocaine & Metabolite, IA: NEGATIVE ng/mL
Methadone, IA: NEGATIVE ng/mL
Opiates, IA: NEGATIVE ng/mL
Oxycodones, IA: NEGATIVE ng/mL
Phencyclidine, IA: NEGATIVE ng/mL
Propoxyphene, IA: NEGATIVE ng/mL
THC(Marijuana) Metabolite, IA: NEGATIVE ng/mL

## 2020-11-21 LAB — CBC WITH DIFFERENTIAL/PLATELET
Basophils Absolute: 0 10*3/uL (ref 0.0–0.2)
Basos: 1 %
EOS (ABSOLUTE): 0.1 10*3/uL (ref 0.0–0.4)
Eos: 2 %
Hematocrit: 35 % (ref 34.0–46.6)
Hemoglobin: 11.4 g/dL (ref 11.1–15.9)
Immature Grans (Abs): 0 10*3/uL (ref 0.0–0.1)
Immature Granulocytes: 0 %
Lymphocytes Absolute: 2.1 10*3/uL (ref 0.7–3.1)
Lymphs: 43 %
MCH: 30.6 pg (ref 26.6–33.0)
MCHC: 32.6 g/dL (ref 31.5–35.7)
MCV: 94 fL (ref 79–97)
Monocytes Absolute: 0.5 10*3/uL (ref 0.1–0.9)
Monocytes: 10 %
Neutrophils Absolute: 2.2 10*3/uL (ref 1.4–7.0)
Neutrophils: 44 %
Platelets: 270 10*3/uL (ref 150–450)
RBC: 3.72 x10E6/uL — ABNORMAL LOW (ref 3.77–5.28)
RDW: 12.2 % (ref 11.7–15.4)
WBC: 4.9 10*3/uL (ref 3.4–10.8)

## 2020-11-21 LAB — CMP14+EGFR
ALT: 10 IU/L (ref 0–32)
AST: 18 IU/L (ref 0–40)
Albumin/Globulin Ratio: 2.2 (ref 1.2–2.2)
Albumin: 4.7 g/dL (ref 3.7–4.7)
Alkaline Phosphatase: 88 IU/L (ref 44–121)
BUN/Creatinine Ratio: 20 (ref 12–28)
BUN: 18 mg/dL (ref 8–27)
Bilirubin Total: 0.4 mg/dL (ref 0.0–1.2)
CO2: 24 mmol/L (ref 20–29)
Calcium: 9.7 mg/dL (ref 8.7–10.3)
Chloride: 103 mmol/L (ref 96–106)
Creatinine, Ser: 0.88 mg/dL (ref 0.57–1.00)
Globulin, Total: 2.1 g/dL (ref 1.5–4.5)
Glucose: 78 mg/dL (ref 65–99)
Potassium: 4.4 mmol/L (ref 3.5–5.2)
Sodium: 142 mmol/L (ref 134–144)
Total Protein: 6.8 g/dL (ref 6.0–8.5)
eGFR: 69 mL/min/{1.73_m2} (ref >59)

## 2020-11-21 LAB — LIPID PANEL
Chol/HDL Ratio: 2.7 ratio (ref 0.0–4.4)
Cholesterol, Total: 212 mg/dL — ABNORMAL HIGH (ref 100–199)
HDL: 78 mg/dL (ref >39)
LDL Chol Calc (NIH): 116 mg/dL — ABNORMAL HIGH (ref 0–99)
Triglycerides: 103 mg/dL (ref 0–149)
VLDL Cholesterol Cal: 18 mg/dL (ref 5–40)

## 2020-11-25 DIAGNOSIS — L570 Actinic keratosis: Secondary | ICD-10-CM | POA: Diagnosis not present

## 2020-12-02 ENCOUNTER — Ambulatory Visit (INDEPENDENT_AMBULATORY_CARE_PROVIDER_SITE_OTHER): Payer: Medicare Other

## 2020-12-02 VITALS — Ht 59.0 in | Wt 100.0 lb

## 2020-12-02 DIAGNOSIS — Z0000 Encounter for general adult medical examination without abnormal findings: Secondary | ICD-10-CM | POA: Diagnosis not present

## 2020-12-02 NOTE — Progress Notes (Signed)
Subjective:   Karina Clark is a 75 y.o. female who presents for Medicare Annual (Subsequent) preventive examination.  Virtual Visit via Telephone Note  I connected with  Karina Clark on 12/02/20 at  9:45 AM EDT by telephone and verified that I am speaking with the correct person using two identifiers.  Location: Patient: Home Provider: [facility] Persons participating in the virtual visit: patient/Nurse Health Advisor   I discussed the limitations, risks, security and privacy concerns of performing an evaluation and management service by telephone and the availability of in person appointments. The patient expressed understanding and agreed to proceed.  Interactive audio and video telecommunications were attempted between this nurse and patient, however failed, due to patient having technical difficulties OR patient did not have access to video capability.  We continued and completed visit with audio only.  Some vital signs may be absent or patient reported.   Karina Clark Karina Karina Mcsorley, LPN   Review of Systems     Cardiac Risk Factors include: advanced age (>50men, >57 women);dyslipidemia;sedentary lifestyle     Objective:    Today's Vitals   12/02/20 0949  Weight: 100 lb (45.4 kg)  Height: 4\' 11"  (1.499 m)   Body mass index is 20.2 kg/m.  Advanced Directives 12/02/2019 11/29/2018 08/07/2017 11/17/2015  Does Patient Have a Medical Advance Directive? Yes Yes Yes No  Type of Advance Directive Healthcare Power of Attorney Living will;Healthcare Power of [place];Living will -  Does patient want to make changes to medical advance directive? No - Patient declined No - Patient declined No - Patient declined -  Copy of Finger in Chart? No - copy requested No - copy requested No - copy requested -  Would patient like information on creating a medical advance directive? - No - Patient declined - No - patient declined information;Yes -  Educational materials given    Current Medications (verified) Outpatient Encounter Medications as of 12/02/2020  Medication Sig   aspirin EC 81 MG tablet Take 81 mg by mouth daily.   atorvastatin (LIPITOR) 20 MG tablet Take 1 tablet (20 mg total) by mouth daily. For cholesterol   escitalopram (LEXAPRO) 20 MG tablet Take 1 tablet (20 mg total) by mouth daily.   ketoconazole (NIZORAL) 2 % cream Apply 1 application topically daily.   Multiple Vitamins-Minerals (EQ VISION FORMULA 50+) CAPS Take 1 capsule by mouth.   Omega-3 1000 MG CAPS Take 3 g by mouth daily.    pantoprazole (PROTONIX) 40 MG tablet TAKE 1 TABLET BY MOUTH ONCE DAILY FOR  STOMACH   Vitamin D, Cholecalciferol, 1000 UNITS CAPS Take 2,000 Units by mouth daily.    zolpidem (AMBIEN) 10 MG tablet TAKE 1 TABLET BY MOUTH AT BEDTIME   [DISCONTINUED] mometasone (NASONEX) 50 MCG/ACT nasal spray Place 2 sprays into the nose daily.   [DISCONTINUED] polyethylene glycol powder (GLYCOLAX/MIRALAX) 17 GM/SCOOP powder Take 17 g by mouth 2 (two) times daily as needed for moderate constipation. For constipation   No facility-administered encounter medications on file as of 12/02/2020.    Allergies (verified) Mycelex [clotrimazole], Naproxen, and Sulfamethoxazole-trimethoprim   History: Past Medical History:  Diagnosis Date   Anxiety    Cataract    Depression    GERD (gastroesophageal reflux disease)    Hyperlipidemia    Hypertension    Osteopenia    Substance abuse ([place])    DCed cocaine in 1998   Past Surgical History:  Procedure Laterality Date   EYE  SURGERY     NASAL SEPTUM SURGERY     TUBAL LIGATION     Family History  Problem Relation Age of Onset   Hyperlipidemia Mother    Thyroid disease Mother    Hip fracture Mother    Hypertension Father    Dementia Father    Cancer Brother        spine   Neuropathy Brother    Hypertension Daughter    Social History   Socioeconomic History   Marital status: Widowed    Spouse  name: Not on file   Number of children: 1   Years of education: 42   Highest education level: 12th grade  Occupational History   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 She has one adult daughter and 2 grandchildren. She enjoys reading. She lives in a one story home with steps up onto her deck with a handrail.    Her daughter has mental health issues - she stays worried about her and she is helping care for her, going to her house daily   Social Determinants of Health   Financial Resource Strain: Low Risk    Difficulty of Paying Living Expenses: Not hard at all  Food Insecurity: No Food Insecurity   Worried About Charity fundraiser in the Last Year: Never true   [place] in the Last Year: Never true  Transportation Needs: No Transportation Needs   Lack of Transportation (Medical): No   Lack of Transportation (Non-Medical): No  Physical Activity: Insufficiently Active   Days of Exercise per Week: 7 days   Minutes of Exercise per Session: 10 min  Stress: No Stress Concern Present   Feeling of Stress : Only a little  Social Connections: Moderately Integrated   Frequency of Communication with Friends and Family: More than three times a week   Frequency of Social Gatherings with Friends and Family: More than three times a week   Attends Religious Services: More than 4 times per year   Active Member of [employer] or Organizations: Yes   Attends Archivist Meetings: More than 4  times per year   Marital Status: Widowed    Tobacco Counseling Counseling given: Not Answered   Clinical Intake:  Pre-visit preparation completed: Yes  Pain : No/denies pain     BMI - recorded: 20.2 Nutritional Status: BMI of 19-24  Normal Nutritional Risks: None Diabetes: No  How often do you need to have someone help you when you read instructions, pamphlets, or other written materials from your doctor or pharmacy?: 1 - Never  Diabetic? No  Interpreter Needed?: No  Information entered by :: Tabbetha Kutscher, LPN   Activities of Daily Living In your present state of health, do you have any difficulty performing the following activities: 12/02/2020  Hearing? N  Vision? N  Difficulty concentrating or making  decisions? N  Walking or climbing stairs? N  Dressing or bathing? N  Doing errands, shopping? N  Preparing Food and eating ? N  Using the Toilet? N  In the past six months, have you accidently leaked urine? N  Do you have problems with loss of bowel control? N  Managing your Medications? N  Managing your Finances? N  Housekeeping or managing your Housekeeping? N  Some recent data might be hidden    Patient Care Team: Claretta Fraise, MD as PCP - General (Family Medicine) Jyl Heinz, MD as Consulting Physician (Endocrinology) Sandford Craze, MD as Referring Physician (Dermatology)  Indicate any recent Medical Services you may have received from other than [facility] providers in the past year (date may be approximate).     Assessment:   This is a routine wellness examination for [facility].  Hearing/Vision screen Hearing Screening - Comments:: Denies hearing difficulties  Vision Screening - Comments:: Wears eyeglasses - up to date with annual eye exams with Dr Marin Comment in [place] issues and exercise activities discussed: Current Exercise Habits: Home exercise routine, Type of exercise: walking, Time (Minutes): 10, Frequency (Times/Week): 7, Weekly  Exercise (Minutes/Week): 70, Intensity: Mild, Exercise limited by: None identified   Goals Addressed               This Visit's Progress     Patient Stated (pt-stated)   Not on track     "I got a new stationary bike and I would like to use it at least 30 minutes daily"        Depression Screen PHQ 2/9 Scores 12/02/2020 11/18/2020 11/18/2020 07/28/2020 05/12/2020 12/02/2019 09/10/2019  PHQ - 2 Score 2 2 0 0 0 0 0  PHQ- 9 Score 3 3 - - - - -    Fall Risk Fall Risk  12/02/2020 11/18/2020 11/18/2020 07/28/2020 05/12/2020  Falls in the past year? 1 1 0 1 1  Number falls in past yr: 0 0 - 1 0  Comment - - - - -  Injury with Fall? 0 0 - 0 0  Comment - - - - -  Risk Factor Category  - - - - -  Risk for fall due to : Impaired vision History of fall(s) - History of fall(s) History of fall(s)  Follow up Falls prevention discussed Falls evaluation completed - Falls evaluation completed Falls evaluation completed    FALL RISK PREVENTION PERTAINING TO THE HOME:  Any stairs in or around the home? Yes  If so, are there any without handrails? No  Home free of loose throw rugs in walkways, pet beds, electrical cords, etc? Yes  Adequate lighting in your home to reduce risk of falls? Yes   ASSISTIVE DEVICES UTILIZED TO PREVENT FALLS:  Life alert? No  Use of a cane, walker or w/c? No  Grab bars in the bathroom? No  Shower chair or bench in shower? No  Elevated toilet seat or a handicapped toilet? No   TIMED UP AND GO:  Was the test performed? No . Telephonic visit.  Cognitive Function: Normal cognitive status assessed by direct observation by this Nurse Health Advisor. No abnormalities found.   MMSE - Mini Mental State Exam 08/07/2017 11/17/2015  Orientation to time 5 5  Orientation to Place 5 5  Registration 3 3  Attention/ Calculation 5 5  Recall 3 3  Language- name 2 objects 2 2  Language- repeat 1 1  Language- follow 3 step command 3 3  Language-  read & follow direction 1 1  Write a  sentence 1 1  Copy design 1 1  Total score 30 30     6CIT Screen 12/02/2019 11/29/2018  What Year? 0 points 0 points  What month? - 0 points  What time? 0 points 0 points  Count back from 20 0 points 0 points  Months in reverse 0 points 2 points  Repeat phrase 0 points 0 points  Total Score - 2    Immunizations Immunization History  Administered Date(s) Administered   Fluad Quad(high Dose 65+) 02/19/2019, 03/13/2020   Influenza, High Dose Seasonal PF 02/23/2016, 02/27/2017, 03/14/2018   Influenza,inj,Quad PF,6+ Mos 03/05/2015   Moderna SARS-COV2 Booster Vaccination 09/10/2020   Moderna Sars-Covid-2 Vaccination 09/25/2019, 10/23/2019, 04/24/2020   Pneumococcal Conjugate-13 11/28/2013   Pneumococcal Polysaccharide-23 05/09/2012   Zoster Recombinat (Shingrix) 06/25/2018, 06/25/2018, 08/20/2018   Zoster, Live 01/07/2008    TDAP status: Up to date  Flu Vaccine status: Up to date  Pneumococcal vaccine status: Up to date  Covid-19 vaccine status: Completed vaccines  Qualifies for Shingles Vaccine? Yes   Zostavax completed Yes   Shingrix Completed?: Yes  Screening Tests Health Maintenance  Topic Date Due   DEXA SCAN  05/09/2019   TETANUS/TDAP  11/18/2021 (Originally 06/28/2015)   INFLUENZA VACCINE  01/04/2021   COVID-19 Vaccine (5 - Booster for Moderna series) 01/10/2021   MAMMOGRAM  10/01/2022   COLONOSCOPY (Pts 45-98yrs Insurance coverage will need to be confirmed)  07/26/2023   Hepatitis C Screening  Completed   PNA vac Low Risk Adult  Completed   Zoster Vaccines- Shingrix  Completed   HPV VACCINES  Aged Out    Health Maintenance  Health Maintenance Due  Topic Date Due   DEXA SCAN  05/09/2019    Colorectal cancer screening: Type of screening: Colonoscopy. Completed 07/25/2013. Repeat every 10 years  Mammogram status: Completed 09/30/2020. Repeat every year  Bone Density status: Completed 05/08/2017. Results reflect: Bone density results: OSTEOPENIA. Repeat  every 2 years.  Lung Cancer Screening: (Low Dose CT Chest recommended if Age 21-80 years, 30 pack-year currently smoking OR have quit w/in 15years.) does not qualify.   Additional Screening:  Hepatitis C Screening: does qualify; Completed 11/17/2015  Vision Screening: Recommended annual ophthalmology exams for early detection of glaucoma and other disorders of the eye. Is the patient up to date with their annual eye exam?  Yes  Who is the provider or what is the name of the office in which the patient attends annual eye exams? Dr Marin Comment in [place] If pt is not established with a provider, would they like to be referred to a provider to establish care? No .   Dental Screening: Recommended annual dental exams for proper oral hygiene  Community Resource Referral / Chronic Care Management: CRR required this visit?  No   CCM required this visit?  No      Plan:     I have personally reviewed and noted the following in the patient's chart:   Medical and social history Use of alcohol, tobacco or illicit drugs  Current medications and supplements including opioid prescriptions.  Functional ability and status Nutritional status Physical activity Advanced directives List of other physicians Hospitalizations, surgeries, and ER visits in previous 12 months Vitals Screenings to include cognitive, depression, and falls Referrals and appointments  In addition, I have reviewed and discussed with patient certain preventive protocols, quality metrics, and best practice recommendations. A written personalized care plan for preventive services as well  as general preventive health recommendations were provided to patient.     Sandrea Hammond, LPN   0/32/1224   Nurse Notes: None

## 2020-12-02 NOTE — Patient Instructions (Signed)
Karina Clark , Thank you for taking time to come for your Medicare Wellness Visit. I appreciate your ongoing commitment to your health goals. Please review the following plan we discussed and let me know if I can assist you in the future.   Screening recommendations/referrals: Colonoscopy: Done 07/25/2013 - Repeat in 10 years Mammogram: Done 09/30/20 - Repeat annually Bone Density: Done 05/08/2017 - Repeat every 2 years; patient declines Recommended yearly ophthalmology/optometry visit for glaucoma screening and checkup Recommended yearly dental visit for hygiene and checkup  Vaccinations: Influenza vaccine: Done 03/13/2020 - Repeat annually Pneumococcal vaccine: Done 05/09/2012 & 11/28/2013 Tdap vaccine: Done 2007 - due for repeat (okay to get this if you get cut by metal) Shingles vaccine: Done 06/25/2018 & 08/20/2018   Covid-19:Done 09/25/19, 10/23/19, 04/24/20, & 09/10/20  Conditions/risks identified: Aim for 30 minutes of exercise or brisk walking each day, drink 6-8 glasses of water and eat lots of fruits and vegetables.   Next appointment: Follow up in one year for your annual wellness visit    Preventive Care 65 Years and Older, Female Preventive care refers to lifestyle choices and visits with your health care provider that can promote health and wellness. What does preventive care include? A yearly physical exam. This is also called an annual well check. Dental exams once or twice a year. Routine eye exams. Ask your health care provider how often you should have your eyes checked. Personal lifestyle choices, including: Daily care of your teeth and gums. Regular physical activity. Eating a healthy diet. Avoiding tobacco and drug use. Limiting alcohol use. Practicing safe sex. Taking low-dose aspirin every day. Taking vitamin and mineral supplements as recommended by your health care provider. What happens during an annual well check? The services and screenings done by your health  care provider during your annual well check will depend on your age, overall health, lifestyle risk factors, and family history of disease. Counseling  Your health care provider may ask you questions about your: Alcohol use. Tobacco use. Drug use. Emotional well-being. Home and relationship well-being. Sexual activity. Eating habits. History of falls. Memory and ability to understand (cognition). Work and work Statistician. Reproductive health. Screening  You may have the following tests or measurements: Height, weight, and BMI. Blood pressure. Lipid and cholesterol levels. These may be checked every 5 years, or more frequently if you are over 4 years old. Skin check. Lung cancer screening. You may have this screening every year starting at age 62 if you have a 30-pack-year history of smoking and currently smoke or have quit within the past 15 years. Fecal occult blood test (FOBT) of the stool. You may have this test every year starting at age 72. Flexible sigmoidoscopy or colonoscopy. You may have a sigmoidoscopy every 5 years or a colonoscopy every 10 years starting at age 22. Hepatitis C blood test. Hepatitis B blood test. Sexually transmitted disease (STD) testing. Diabetes screening. This is done by checking your blood sugar (glucose) after you have not eaten for a while (fasting). You may have this done every 1-3 years. Bone density scan. This is done to screen for osteoporosis. You may have this done starting at age 81. Mammogram. This may be done every 1-2 years. Talk to your health care provider about how often you should have regular mammograms. Talk with your health care provider about your test results, treatment options, and if necessary, the need for more tests. Vaccines  Your health care provider may recommend certain vaccines, such as: Influenza  vaccine. This is recommended every year. Tetanus, diphtheria, and acellular pertussis (Tdap, Td) vaccine. You may need a Td  booster every 10 years. Zoster vaccine. You may need this after age 55. Pneumococcal 13-valent conjugate (PCV13) vaccine. One dose is recommended after age 49. Pneumococcal polysaccharide (PPSV23) vaccine. One dose is recommended after age 75. Talk to your health care provider about which screenings and vaccines you need and how often you need them. This information is not intended to replace advice given to you by your health care provider. Make sure you discuss any questions you have with your health care provider. Document Released: 06/19/2015 Document Revised: 02/10/2016 Document Reviewed: 03/24/2015 Elsevier Interactive Patient Education  2017 [place] Prevention in the Home Falls can cause injuries. They can happen to people of all ages. There are many things you can do to make your home safe and to help prevent falls. What can I do on the outside of my home? Regularly fix the edges of walkways and driveways and fix any cracks. Remove anything that might make you trip as you walk through a door, such as a raised step or threshold. Trim any bushes or trees on the path to your home. Use bright outdoor lighting. Clear any walking paths of anything that might make someone trip, such as rocks or tools. Regularly check to see if handrails are loose or broken. Make sure that both sides of any steps have handrails. Any raised decks and porches should have guardrails on the edges. Have any leaves, snow, or ice cleared regularly. Use sand or salt on walking paths during winter. Clean up any spills in your garage right away. This includes oil or grease spills. What can I do in the bathroom? Use night lights. Install grab bars by the toilet and in the tub and shower. Do not use towel bars as grab bars. Use non-skid mats or decals in the tub or shower. If you need to sit down in the shower, use a plastic, non-slip stool. Keep the floor dry. Clean up any water that spills on the floor  as soon as it happens. Remove soap buildup in the tub or shower regularly. Attach bath mats securely with double-sided non-slip rug tape. Do not have throw rugs and other things on the floor that can make you trip. What can I do in the bedroom? Use night lights. Make sure that you have a light by your bed that is easy to reach. Do not use any sheets or blankets that are too big for your bed. They should not hang down onto the floor. Have a firm chair that has side arms. You can use this for support while you get dressed. Do not have throw rugs and other things on the floor that can make you trip. What can I do in the kitchen? Clean up any spills right away. Avoid walking on wet floors. Keep items that you use a lot in easy-to-reach places. If you need to reach something above you, use a strong step stool that has a grab bar. Keep electrical cords out of the way. Do not use floor polish or wax that makes floors slippery. If you must use wax, use non-skid floor wax. Do not have throw rugs and other things on the floor that can make you trip. What can I do with my stairs? Do not leave any items on the stairs. Make sure that there are handrails on both sides of the stairs and use them. Fix  handrails that are broken or loose. Make sure that handrails are as long as the stairways. Check any carpeting to make sure that it is firmly attached to the stairs. Fix any carpet that is loose or worn. Avoid having throw rugs at the top or bottom of the stairs. If you do have throw rugs, attach them to the floor with carpet tape. Make sure that you have a light switch at the top of the stairs and the bottom of the stairs. If you do not have them, ask someone to add them for you. What else can I do to help prevent falls? Wear shoes that: Do not have high heels. Have rubber bottoms. Are comfortable and fit you well. Are closed at the toe. Do not wear sandals. If you use a stepladder: Make sure that it is  fully opened. Do not climb a closed stepladder. Make sure that both sides of the stepladder are locked into place. Ask someone to hold it for you, if possible. Clearly mark and make sure that you can see: Any grab bars or handrails. First and last steps. Where the edge of each step is. Use tools that help you move around (mobility aids) if they are needed. These include: Canes. Walkers. Scooters. Crutches. Turn on the lights when you go into a dark area. Replace any light bulbs as soon as they burn out. Set up your furniture so you have a clear path. Avoid moving your furniture around. If any of your floors are uneven, fix them. If there are any pets around you, be aware of where they are. Review your medicines with your doctor. Some medicines can make you feel dizzy. This can increase your chance of falling. Ask your doctor what other things that you can do to help prevent falls. This information is not intended to replace advice given to you by your health care provider. Make sure you discuss any questions you have with your health care provider. Document Released: 03/19/2009 Document Revised: 10/29/2015 Document Reviewed: 06/27/2014 Elsevier Interactive Patient Education  2017 [employer].

## 2020-12-18 ENCOUNTER — Telehealth: Payer: Self-pay | Admitting: Family Medicine

## 2020-12-18 NOTE — Telephone Encounter (Signed)
  Prescription Request  12/18/2020  What is the name of the medication or equipment? Lexapro  Have you contacted your pharmacy to request a refill? (if applicable) yes  Which pharmacy would you like this sent to? [facility]   Patient notified that their request is being sent to the clinical staff for review and that they should receive a response within 2 business days.    Stacks' pt.  She seen him in June & he didn't refill her meds.  Please call pt.

## 2020-12-18 NOTE — Telephone Encounter (Signed)
Pt aware meds were refilled at 11/18/20 visit, she only got #30 yesterday which could have been the refill that was called in on 11/13/20 and the others are on file for her at [facility], she will see when she gets her next refill done.

## 2021-03-08 ENCOUNTER — Other Ambulatory Visit: Payer: Self-pay

## 2021-03-08 ENCOUNTER — Ambulatory Visit (INDEPENDENT_AMBULATORY_CARE_PROVIDER_SITE_OTHER): Payer: Medicare Other

## 2021-03-08 DIAGNOSIS — Z23 Encounter for immunization: Secondary | ICD-10-CM

## 2021-03-08 DIAGNOSIS — H5203 Hypermetropia, bilateral: Secondary | ICD-10-CM | POA: Diagnosis not present

## 2021-06-18 ENCOUNTER — Other Ambulatory Visit: Payer: Self-pay | Admitting: Family Medicine

## 2021-06-18 DIAGNOSIS — K219 Gastro-esophageal reflux disease without esophagitis: Secondary | ICD-10-CM

## 2021-06-25 ENCOUNTER — Other Ambulatory Visit: Payer: Self-pay | Admitting: Family Medicine

## 2021-07-12 ENCOUNTER — Encounter: Payer: Self-pay | Admitting: Family Medicine

## 2021-07-12 ENCOUNTER — Ambulatory Visit (INDEPENDENT_AMBULATORY_CARE_PROVIDER_SITE_OTHER): Payer: Medicare Other | Admitting: Family Medicine

## 2021-07-12 VITALS — BP 137/66 | HR 51 | Temp 97.5°F | Ht <= 58 in | Wt 100.6 lb

## 2021-07-12 DIAGNOSIS — K219 Gastro-esophageal reflux disease without esophagitis: Secondary | ICD-10-CM | POA: Diagnosis not present

## 2021-07-12 DIAGNOSIS — Z79899 Other long term (current) drug therapy: Secondary | ICD-10-CM

## 2021-07-12 DIAGNOSIS — F331 Major depressive disorder, recurrent, moderate: Secondary | ICD-10-CM

## 2021-07-12 DIAGNOSIS — E782 Mixed hyperlipidemia: Secondary | ICD-10-CM | POA: Diagnosis not present

## 2021-07-12 DIAGNOSIS — F5101 Primary insomnia: Secondary | ICD-10-CM

## 2021-07-12 MED ORDER — QUETIAPINE FUMARATE 25 MG PO TABS: 25.0000 mg | ORAL_TABLET | Freq: Every day | ORAL | 1 refills | Status: DC

## 2021-07-12 MED ORDER — KETOCONAZOLE 2 % EX CREA: 1.0000 "application " | TOPICAL_CREAM | Freq: Every day | CUTANEOUS | 5 refills | Status: DC

## 2021-07-12 MED ORDER — PANTOPRAZOLE SODIUM 40 MG PO TBEC: DELAYED_RELEASE_TABLET | ORAL | 3 refills | Status: DC

## 2021-07-12 MED ORDER — ESCITALOPRAM OXALATE 20 MG PO TABS: 20.0000 mg | ORAL_TABLET | Freq: Every day | ORAL | 3 refills | Status: DC

## 2021-07-12 MED ORDER — ZOLPIDEM TARTRATE 10 MG PO TABS: ORAL_TABLET | ORAL | 1 refills | Status: DC

## 2021-07-12 MED ORDER — ATORVASTATIN CALCIUM 20 MG PO TABS: 20.0000 mg | ORAL_TABLET | Freq: Every day | ORAL | 2 refills | Status: DC

## 2021-07-12 MED ORDER — ESCITALOPRAM OXALATE 20 MG PO TABS: 20.0000 mg | ORAL_TABLET | Freq: Every day | ORAL | 2 refills | Status: DC

## 2021-07-12 MED ORDER — ZOLPIDEM TARTRATE 5 MG PO TABS: ORAL_TABLET | ORAL | 2 refills | Status: DC

## 2021-07-12 NOTE — Progress Notes (Signed)
Subjective:  Patient ID: Karina Clark, female    DOB: [birth year]  Age: 76 y.o. MRN: [id number]  CC: Medical Management of Chronic Issues   HPI Karina Clark presents for  in for follow-up of elevated cholesterol. Doing well without complaints on current medication. HAving some mylagia. Concerned for confusion, seems to be progressive. Very worried about depressed daughter. Cat is sick requiring frequent trips to the vet.  Currently no chest pain, shortness of breath or other cardiovascular related symptoms noted.   Depression screen [facility] 2/9 07/12/2021 07/12/2021 12/02/2020  Decreased Interest 1 0 1  Down, Depressed, Hopeless 1 0 1  PHQ - 2 Score 2 0 2  Altered sleeping 2 - 0  Tired, decreased energy 2 - 1  Change in appetite 1 - 0  Feeling bad or failure about yourself  1 - 0  Trouble concentrating 1 - 0  Moving slowly or fidgety/restless 0 - 0  Suicidal thoughts 0 - 0  PHQ-9 Score 9 - 3  Difficult doing work/chores Somewhat difficult - Somewhat difficult  Some recent data might be hidden    History Karina Clark has a past medical history of Anxiety, Cataract, Depression, GERD (gastroesophageal reflux disease), Hyperlipidemia, Hypertension, Osteopenia, and Substance abuse ([place]).   Karina Clark has a past surgical history that includes Tubal ligation; Eye surgery; and Nasal septum surgery.   Karina Clark family history includes Cancer in Karina Clark brother; Dementia in Karina Clark father; Hip fracture in Karina Clark mother; Hyperlipidemia in Karina Clark mother; Hypertension in Karina Clark daughter and father; Neuropathy in Karina Clark brother; Thyroid disease in Karina Clark mother.Karina Clark reports that Karina Clark quit smoking about 26 years ago. Karina Clark smoking use included cigarettes. Karina Clark started smoking about 52 years ago. Karina Clark smoked an average of .5 packs per day. Karina Clark has never used smokeless tobacco. Karina Clark reports that Karina Clark does not drink alcohol and does not use drugs.    ROS Review of Systems  Constitutional: Negative.   HENT: Negative.    Eyes:  Negative for visual  disturbance.  Respiratory:  Negative for shortness of breath.   Cardiovascular:  Negative for chest pain.  Gastrointestinal:  Negative for abdominal pain.  Musculoskeletal:  Negative for arthralgias.   Objective:  BP 137/66    Pulse (!) 51    Temp (!) 97.5 F (36.4 C)    Ht 4' 10"  (1.473 m)    Wt 100 lb 9.6 oz (45.6 kg)    SpO2 100%    BMI 21.03 kg/m   BP Readings from Last 3 Encounters:  07/12/21 137/66  11/18/20 127/68  08/10/20 (!) 123/58    Wt Readings from Last 3 Encounters:  07/12/21 100 lb 9.6 oz (45.6 kg)  12/02/20 100 lb (45.4 kg)  11/18/20 101 lb 3.2 oz (45.9 kg)     Physical Exam Constitutional:      General: Karina Clark is not in acute distress.    Appearance: Karina Clark is well-developed.  Cardiovascular:     Rate and Rhythm: Normal rate and regular rhythm.  Pulmonary:     Breath sounds: Normal breath sounds.  Musculoskeletal:        General: Normal range of motion.  Skin:    General: Skin is warm and dry.  Neurological:     Mental Status: Karina Clark is alert and oriented to person, place, and time.      Assessment & Plan:   Karina Clark was seen today for medical management of chronic issues.  Diagnoses and all orders for this visit:  Mixed hyperlipidemia -  CBC with Differential/Platelet -     CMP14+EGFR -     Lipid panel  Gastroesophageal reflux disease without esophagitis -     pantoprazole (PROTONIX) 40 MG tablet; TAKE 1 TABLET BY MOUTH ONCE DAILY FOR  STOMACH (NEEDS TO BE SEEN BEFORE NEXT REFILL)  Long term prescription benzodiazepine use -     Discontinue: zolpidem (AMBIEN) 10 MG tablet; TAKE 1 TABLET BY MOUTH AT BEDTIME -     zolpidem (AMBIEN) 5 MG tablet; TAKE 1 TABLET BY MOUTH AT BEDTIME  Primary insomnia -     Discontinue: zolpidem (AMBIEN) 10 MG tablet; TAKE 1 TABLET BY MOUTH AT BEDTIME -     zolpidem (AMBIEN) 5 MG tablet; TAKE 1 TABLET BY MOUTH AT BEDTIME  Moderate episode of recurrent major depressive disorder (HCC) -     TSH  Other orders -      atorvastatin (LIPITOR) 20 MG tablet; Take 1 tablet (20 mg total) by mouth daily. for cholesterol. -     Discontinue: escitalopram (LEXAPRO) 20 MG tablet; Take 1 tablet (20 mg total) by mouth daily. -     ketoconazole (NIZORAL) 2 % cream; Apply 1 application topically daily. -     QUEtiapine (SEROQUEL) 25 MG tablet; Take 1 tablet (25 mg total) by mouth at bedtime. -     escitalopram (LEXAPRO) 20 MG tablet; Take 1 tablet (20 mg total) by mouth daily.       Karina Clark have discontinued Cresenciano Lick. [place] "Kemiya Jane"'s zolpidem. Karina Clark have also changed Karina Clark atorvastatin and zolpidem. Additionally, Karina Clark am having Karina Clark start on QUEtiapine. Lastly, Karina Clark am having Karina Clark maintain Karina Clark aspirin EC, Vitamin D (Cholecalciferol), Omega-3, EQ Vision Formula 50+, ketoconazole, pantoprazole, and escitalopram.  Allergies as of 07/12/2021       Reactions   Mycelex [clotrimazole] Other (See Comments)   Worsened GERD / nausea   Naproxen Other (See Comments)   "Makes my stomach burn"   Sulfamethoxazole-trimethoprim Nausea And Vomiting        Medication List        Accurate as of July 12, 2021  2:27 PM. If you have any questions, ask your nurse or doctor.          aspirin EC 81 MG tablet Take 81 mg by mouth daily.   atorvastatin 20 MG tablet Commonly known as: LIPITOR Take 1 tablet (20 mg total) by mouth daily. for cholesterol.   EQ Vision Formula 50+ Caps Take 1 capsule by mouth.   escitalopram 20 MG tablet Commonly known as: LEXAPRO Take 1 tablet (20 mg total) by mouth daily.   ketoconazole 2 % cream Commonly known as: NIZORAL Apply 1 application topically daily.   Omega-3 1000 MG Caps Take 3 g by mouth daily.   pantoprazole 40 MG tablet Commonly known as: PROTONIX TAKE 1 TABLET BY MOUTH ONCE DAILY FOR  STOMACH (NEEDS TO BE SEEN BEFORE NEXT REFILL)   QUEtiapine 25 MG tablet Commonly known as: SEROQUEL Take 1 tablet (25 mg total) by mouth at bedtime. Started by: Claretta Fraise, MD   Vitamin D  (Cholecalciferol) 25 MCG (1000 UT) Caps Take 2,000 Units by mouth daily.   zolpidem 5 MG tablet Commonly known as: AMBIEN TAKE 1 TABLET BY MOUTH AT BEDTIME What changed: medication strength Changed by: Claretta Fraise, MD       Try  2-3 weeks off of atorvastatin. If the symptoms improve a lot, let me know.  Follow-up: Return in about 6 weeks (around 08/23/2021).  Claretta Fraise,  M.D.

## 2021-07-13 ENCOUNTER — Telehealth: Payer: Self-pay | Admitting: Family Medicine

## 2021-07-13 LAB — CMP14+EGFR
ALT: 19 IU/L (ref 0–32)
AST: 24 IU/L (ref 0–40)
Albumin/Globulin Ratio: 2.3 — ABNORMAL HIGH (ref 1.2–2.2)
Albumin: 4.5 g/dL (ref 3.7–4.7)
Alkaline Phosphatase: 80 IU/L (ref 44–121)
BUN/Creatinine Ratio: 13 (ref 12–28)
BUN: 11 mg/dL (ref 8–27)
Bilirubin Total: 0.4 mg/dL (ref 0.0–1.2)
CO2: 24 mmol/L (ref 20–29)
Calcium: 9.6 mg/dL (ref 8.7–10.3)
Chloride: 102 mmol/L (ref 96–106)
Creatinine, Ser: 0.88 mg/dL (ref 0.57–1.00)
Globulin, Total: 2 g/dL (ref 1.5–4.5)
Glucose: 93 mg/dL (ref 70–99)
Potassium: 4.7 mmol/L (ref 3.5–5.2)
Sodium: 138 mmol/L (ref 134–144)
Total Protein: 6.5 g/dL (ref 6.0–8.5)
eGFR: 69 mL/min/{1.73_m2} (ref >59)

## 2021-07-13 LAB — CBC WITH DIFFERENTIAL/PLATELET
Basophils Absolute: 0 10*3/uL (ref 0.0–0.2)
Basos: 1 %
EOS (ABSOLUTE): 0.1 10*3/uL (ref 0.0–0.4)
Eos: 1 %
Hematocrit: 34.8 % (ref 34.0–46.6)
Hemoglobin: 11.5 g/dL (ref 11.1–15.9)
Immature Grans (Abs): 0 10*3/uL (ref 0.0–0.1)
Immature Granulocytes: 0 %
Lymphocytes Absolute: 2.5 10*3/uL (ref 0.7–3.1)
Lymphs: 42 %
MCH: 30.7 pg (ref 26.6–33.0)
MCHC: 33 g/dL (ref 31.5–35.7)
MCV: 93 fL (ref 79–97)
Monocytes Absolute: 0.5 10*3/uL (ref 0.1–0.9)
Monocytes: 8 %
Neutrophils Absolute: 2.8 10*3/uL (ref 1.4–7.0)
Neutrophils: 48 %
Platelets: 234 10*3/uL (ref 150–450)
RBC: 3.74 x10E6/uL — ABNORMAL LOW (ref 3.77–5.28)
RDW: 13.1 % (ref 11.7–15.4)
WBC: 5.9 10*3/uL (ref 3.4–10.8)

## 2021-07-13 LAB — LIPID PANEL
Chol/HDL Ratio: 2.9 ratio (ref 0.0–4.4)
Cholesterol, Total: 191 mg/dL (ref 100–199)
HDL: 66 mg/dL (ref >39)
LDL Chol Calc (NIH): 107 mg/dL — ABNORMAL HIGH (ref 0–99)
Triglycerides: 102 mg/dL (ref 0–149)
VLDL Cholesterol Cal: 18 mg/dL (ref 5–40)

## 2021-07-13 LAB — TSH: TSH: 1.7 u[IU]/mL (ref 0.450–4.500)

## 2021-07-16 NOTE — Telephone Encounter (Signed)
Can shetry to take just 1/2 of a seroquel?

## 2021-07-16 NOTE — Telephone Encounter (Signed)
PATIENT WILL TRY IT TONIGHT

## 2021-07-22 NOTE — Progress Notes (Signed)
Hello  Geniene,    Your lab result is normal and/or stable.Some minor variations that are not significant are commonly marked abnormal, but do not represent any medical problem for you.   Best regards,  Melania Kirks, M.D.

## 2021-08-02 ENCOUNTER — Telehealth: Payer: Self-pay | Admitting: Family Medicine

## 2021-08-02 NOTE — Telephone Encounter (Signed)
Advised patient yes

## 2021-08-24 ENCOUNTER — Ambulatory Visit: Payer: Medicare Other | Admitting: Family Medicine

## 2021-08-25 ENCOUNTER — Ambulatory Visit (INDEPENDENT_AMBULATORY_CARE_PROVIDER_SITE_OTHER): Payer: Medicare Other | Admitting: Family Medicine

## 2021-08-25 ENCOUNTER — Encounter: Payer: Self-pay | Admitting: Family Medicine

## 2021-08-25 VITALS — BP 140/64 | HR 55 | Temp 97.3°F | Ht <= 58 in | Wt 101.6 lb

## 2021-08-25 DIAGNOSIS — F419 Anxiety disorder, unspecified: Secondary | ICD-10-CM

## 2021-08-25 DIAGNOSIS — F5101 Primary insomnia: Secondary | ICD-10-CM | POA: Diagnosis not present

## 2021-08-25 MED ORDER — VENLAFAXINE HCL ER 75 MG PO CP24: 75.0000 mg | ORAL_CAPSULE | Freq: Every day | ORAL | 1 refills | Status: DC

## 2021-08-25 NOTE — Progress Notes (Signed)
? ?Subjective:  ?Patient ID: Karina Clark, female    DOB: August 24, 1945  Age: 76 y.o. MRN: [id number] ? ?CC: Medical Management of Chronic Issues ? ? ?HPI ?[name] presents for tried seroquel. Made her too "out of it" to function. Still anxious and not sleeping well as a result.  ? ? ?  08/25/2021  ? 11:06 AM 07/12/2021  ?  2:00 PM 07/12/2021  ?  1:51 PM  ?Depression screen PHQ 2/9  ?Decreased Interest 1 1 0  ?Down, Depressed, Hopeless 1 1 0  ?PHQ - 2 Score 2 2 0  ?Altered sleeping 1 2   ?Tired, decreased energy 1 2   ?Change in appetite 0 1   ?Feeling bad or failure about yourself  1 1   ?Trouble concentrating 0 1   ?Moving slowly or fidgety/restless 0 0   ?Suicidal thoughts 0 0   ?PHQ-9 Score 5 9   ?Difficult doing work/chores Not difficult at all Somewhat difficult   ? ? ?History ?Kania has a past medical history of Anxiety, Cataract, Depression, GERD (gastroesophageal reflux disease), Hyperlipidemia, Hypertension, Osteopenia, and Substance abuse ([place]).  ? ?She has a past surgical history that includes Tubal ligation; Eye surgery; and Nasal septum surgery.  ? ?Her family history includes Cancer in her brother; Dementia in her father; Hip fracture in her mother; Hyperlipidemia in her mother; Hypertension in her daughter and father; Neuropathy in her brother; Thyroid disease in her mother.She reports that she quit smoking about 26 years ago. Her smoking use included cigarettes. She started smoking about 52 years ago. She smoked an average of .5 packs per day. She has never used smokeless tobacco. She reports that she does not drink alcohol and does not use drugs. ? ? ? ?ROS ?Review of Systems  ?Constitutional: Negative.   ?HENT: Negative.    ?Eyes:  Negative for visual disturbance.  ?Respiratory:  Negative for shortness of breath.   ?Cardiovascular:  Negative for chest pain.  ?Gastrointestinal:  Negative for abdominal pain.  ?Musculoskeletal:  Negative for arthralgias.  ? ?Objective:  ?BP 140/64   Pulse (!) 55    Temp (!) 97.3 ?F (36.3 ?C)   Ht '4\' 10"'$  (1.473 m)   Wt 101 lb 9.6 oz (46.1 kg)   SpO2 99%   BMI 21.23 kg/m?  ? ?BP Readings from Last 3 Encounters:  ?08/25/21 140/64  ?07/12/21 137/66  ?11/18/20 127/68  ? ? ?Wt Readings from Last 3 Encounters:  ?08/25/21 101 lb 9.6 oz (46.1 kg)  ?07/12/21 100 lb 9.6 oz (45.6 kg)  ?12/02/20 100 lb (45.4 kg)  ? ? ? ?Physical Exam ?Constitutional:   ?   General: She is not in acute distress. ?   Appearance: She is well-developed.  ?Cardiovascular:  ?   Rate and Rhythm: Normal rate and regular rhythm.  ?Pulmonary:  ?   Breath sounds: Normal breath sounds.  ?Musculoskeletal:     ?   General: Normal range of motion.  ?Skin: ?   General: Skin is warm and dry.  ?Neurological:  ?   Mental Status: She is alert and oriented to person, place, and time.  ? ? ? ? ?Assessment & Plan:  ? ? ? ? ? ?I have discontinued Cresenciano Lick. [place] "Evolett Jane"'s QUEtiapine and escitalopram. I am also having her start on venlafaxine XR. Additionally, I am having her maintain her aspirin EC, Vitamin D (Cholecalciferol), Omega-3, EQ Vision Formula 50+, atorvastatin, ketoconazole, pantoprazole, and zolpidem. ? ?Allergies as of 08/25/2021   ? ?  Reactions  ? Seroquel [quetiapine]   ? Drowsiness the next morning  ? Mycelex [clotrimazole] Other (See Comments)  ? Worsened GERD / nausea  ? Naproxen Other (See Comments)  ? "Makes my stomach burn"  ? Sulfamethoxazole-trimethoprim Nausea And Vomiting  ? ?  ? ?  ?Medication List  ?  ? ?  ? Accurate as of August 25, 2021  8:50 PM. If you have any questions, ask your nurse or doctor.  ?  ?  ? ?  ? ?STOP taking these medications   ? ?escitalopram 20 MG tablet ?Commonly known as: LEXAPRO ?Stopped by: Claretta Fraise, MD ?  ?QUEtiapine 25 MG tablet ?Commonly known as: SEROQUEL ?Stopped by: Claretta Fraise, MD ?  ? ?  ? ?TAKE these medications   ? ?aspirin EC 81 MG tablet ?Take 81 mg by mouth daily. ?  ?atorvastatin 20 MG tablet ?Commonly known as: LIPITOR ?Take 1 tablet (20 mg total)  by mouth daily. for cholesterol. ?  ?EQ Vision Formula 50+ Caps ?Take 1 capsule by mouth. ?  ?ketoconazole 2 % cream ?Commonly known as: NIZORAL ?Apply 1 application topically daily. ?  ?Omega-3 1000 MG Caps ?Take 3 g by mouth daily. ?  ?pantoprazole 40 MG tablet ?Commonly known as: PROTONIX ?TAKE 1 TABLET BY MOUTH ONCE DAILY FOR  STOMACH (NEEDS TO BE SEEN BEFORE NEXT REFILL) ?  ?venlafaxine XR 75 MG 24 hr capsule ?Commonly known as: Effexor XR ?Take 1 capsule (75 mg total) by mouth at bedtime. ?Started by: Claretta Fraise, MD ?  ?Vitamin D (Cholecalciferol) 25 MCG (1000 UT) Caps ?Take 2,000 Units by mouth daily. ?  ?zolpidem 5 MG tablet ?Commonly known as: AMBIEN ?TAKE 1 TABLET BY MOUTH AT BEDTIME ?  ? ?  ? ? ? ?Follow-up: Return in about 1 month (around 09/25/2021). ? ?Claretta Fraise, M.D. ?

## 2021-09-08 ENCOUNTER — Other Ambulatory Visit: Payer: Self-pay | Admitting: Family Medicine

## 2021-09-08 DIAGNOSIS — Z1231 Encounter for screening mammogram for malignant neoplasm of breast: Secondary | ICD-10-CM

## 2021-09-17 ENCOUNTER — Encounter: Payer: Self-pay | Admitting: Family Medicine

## 2021-09-17 ENCOUNTER — Ambulatory Visit (INDEPENDENT_AMBULATORY_CARE_PROVIDER_SITE_OTHER): Payer: Medicare Other | Admitting: Family Medicine

## 2021-09-17 VITALS — BP 140/72 | HR 64 | Temp 97.9°F | Ht <= 58 in | Wt 100.4 lb

## 2021-09-17 DIAGNOSIS — J40 Bronchitis, not specified as acute or chronic: Secondary | ICD-10-CM

## 2021-09-17 DIAGNOSIS — J329 Chronic sinusitis, unspecified: Secondary | ICD-10-CM

## 2021-09-17 MED ORDER — AZITHROMYCIN 250 MG PO TABS: ORAL_TABLET | ORAL | 0 refills | Status: DC

## 2021-09-17 MED ORDER — CHERATUSSIN AC 100-10 MG/5ML PO SOLN: 5.0000 mL | ORAL | 0 refills | Status: DC | PRN

## 2021-09-17 NOTE — Progress Notes (Signed)
Chief Complaint  ?Patient presents with  ? Nasal Congestion  ? Cough  ? ? ?HPI ? ?Patient presents today for Chest congestion for a week. Feels tight in chest. Denies dyspnea. Cough, productive of yellow sputum. Cough is severe. Subjective fever at onset. No earache. Yellow rhinorrhea.  ? ?PMH: Smoking status noted ?ROS: Per HPI ? ?Objective: ?BP 140/72   Pulse 64   Temp 97.9 ?F (36.6 ?C)   Ht '4\' 10"'$  (1.473 m)   Wt 100 lb 6.4 oz (45.5 kg)   SpO2 100%   BMI 20.98 kg/m?  ?Gen: NAD, alert, cooperative with exam ?HEENT: NCAT, EOMI, PERRL ?CV: RRR, good S1/S2, no murmur ?Resp: CTABL, no wheezes, non-labored ?Abd: SNTND, BS present, no guarding or organomegaly ?Ext: No edema, warm ?Neuro: Alert and oriented, No gross deficits ? ?Assessment and plan: ? ?1. Sinobronchitis   ? ? ?Meds ordered this encounter  ?Medications  ? azithromycin (ZITHROMAX Z-PAK) 250 MG tablet  ?  Sig: Take two right away Then one a day for the next 4 days.  ?  Dispense:  6 each  ?  Refill:  0  ? guaiFENesin-codeine (CHERATUSSIN AC) 100-10 MG/5ML syrup  ?  Sig: Take 5 mLs by mouth every 4 (four) hours as needed for cough.  ?  Dispense:  180 mL  ?  Refill:  0  ? ? ?No orders of the defined types were placed in this encounter. ? ? ?Follow up as needed. ? ?Claretta Fraise, MD ? ? ? ? ?

## 2021-10-06 ENCOUNTER — Ambulatory Visit
Admission: RE | Admit: 2021-10-06 | Discharge: 2021-10-06 | Disposition: A | Discharge status: Home / Self Care | Payer: Medicare Other | Source: Ambulatory Visit | Attending: Family Medicine | Admitting: Family Medicine

## 2021-10-06 DIAGNOSIS — Z1231 Encounter for screening mammogram for malignant neoplasm of breast: Secondary | ICD-10-CM

## 2021-10-14 ENCOUNTER — Other Ambulatory Visit: Payer: Self-pay | Admitting: Family Medicine

## 2021-10-26 ENCOUNTER — Ambulatory Visit (INDEPENDENT_AMBULATORY_CARE_PROVIDER_SITE_OTHER): Payer: Medicare Other | Admitting: Nurse Practitioner

## 2021-10-26 ENCOUNTER — Encounter: Payer: Self-pay | Admitting: Nurse Practitioner

## 2021-10-26 ENCOUNTER — Ambulatory Visit (INDEPENDENT_AMBULATORY_CARE_PROVIDER_SITE_OTHER): Payer: Medicare Other

## 2021-10-26 VITALS — BP 148/70 | HR 64 | Temp 97.7°F | Ht <= 58 in | Wt 98.4 lb

## 2021-10-26 DIAGNOSIS — M25521 Pain in right elbow: Secondary | ICD-10-CM | POA: Diagnosis not present

## 2021-10-26 DIAGNOSIS — M19021 Primary osteoarthritis, right elbow: Secondary | ICD-10-CM | POA: Diagnosis not present

## 2021-10-26 MED ORDER — DICLOFENAC SODIUM 1 % EX GEL: 2.0000 g | Freq: Four times a day (QID) | CUTANEOUS | 0 refills | Status: DC

## 2021-10-26 NOTE — Patient Instructions (Signed)
Pitcher's Elbow  Pitcher's elbow is a type of elbow injury that develops gradually over time (overuse injury). This condition is also called valgus extension overload syndrome (VEOS). This injury is common in athletes who make repeated overhead throwing motions, such as a baseball pitcher repeatedly throwing a ball. Throwing motions can: Overstretch the strong band of tissue (ligament) on the inside of the elbow (ulnar collateral ligament, or UCL). UCL injuries can range from minor inflammation to a complete ligament tear. Push the bones at the outside and back of the elbow together (compression). These forces can injure the ligament over time and create an abnormal extra bone in the elbow (osteophyte or bone spur). What are the causes? This condition may be caused by: Continuous or repetitive overhead throwing, such as baseball pitching. Improper throwing motion. Tightness in the shoulder that puts added demand on the elbow. What increases the risk? This condition is more likely to develop in athletes who play sports that involve repetitive forceful straightening of the elbow, such as: Baseball or softball. Tennis and other racquet sports. Football. Lacrosse. Gymnastics. [place]. What are the signs or symptoms? Symptoms of this condition include: Elbow pain. Increased pain in your elbow as you straighten your arm forcefully, such as when throwing. Swelling. Limited range of motion or "locking" of the elbow. Popping or tearing sensation in your elbow. How is this diagnosed? This condition is diagnosed based on: Your symptoms and medical history. A physical exam. Your health care provider may: Check your elbow's strength, stability, and range of motion. Compare your injured elbow to your other elbow. Gently press your arm and elbow to find the source of pain. Imaging tests, such as: X-rays or CT scans to check for stress fractures and bone spurs. Ultrasound or MRI to check for  tears in the ligaments or tendons. How is this treated? Treatment for this condition may include: Stopping activities that require overhead arm motions, then returning gradually to full activities. Modifying your sports technique to decrease elbow strain. This may include wearing a brace. Taking medicine to relieve pain. Injecting medicines (corticosteroids) into your elbow to reduce swelling and pain. Doing strength and range-of-motion exercises (physical therapy) as told by your health care provider. Surgery. This may be needed if all other treatments do not work. It may involve: Repairing damaged ligaments. Removing abnormal bone growths. Removing pieces of bone or cartilage. After surgery, you will have to wear a brace for several weeks and eventually have physical therapy. Follow these instructions at home: If you have a brace: Wear the brace as told by your health care provider. Remove it only as told by your health care provider. Loosen the brace if your fingers tingle, become numb, or turn cold and blue. Keep the brace clean. If the brace is not waterproof: Do not let it get wet. Cover it with a watertight covering when you take a bath or a shower. Managing pain, stiffness, and swelling  If directed, put ice on the injured area. If you have a removable brace, remove it as told by your health care provider. Put ice in a plastic bag. Place a towel between your skin and the bag. Leave the ice on for 20 minutes, 2-3 times a day. Move your fingers often to avoid stiffness and to lessen swelling. Raise (elevate) the injured area above the level of your heart while you are sitting or lying down. Activity Rest your elbow and avoid activities that require overhead arm motions as told by your health   care provider. Return to your normal activities as told by your health care provider. Ask your health care provider what activities are safe for you. Do exercises as told by your health  care provider. General instructions Do not use any products that contain nicotine or tobacco, such as cigarettes, e-cigarettes, and chewing tobacco. These can delay healing. If you need help quitting, ask your health care provider. Take over-the-counter and prescription medicines only as told by your health care provider. Keep all follow-up visits as told by your health care provider. This is important. How is this prevented? Warm up and stretch before being active. Cool down and stretch after being active. Give your body time to rest between periods of activity. Maintain physical fitness, including: Strength. Flexibility. Have your technique checked to make sure you use proper form. Rest your elbow if you show signs of fatigue. When you start any new athletic activity, increase your participation slowly. Follow the rules for your sport on how often and how much you can throw in a game. Avoid overhead throwing motions as told by your health care provider. Contact a health care provider if: Your pain does not improve or it gets worse after 2-4 weeks of rest. Get help right away if: Your pain is severe. You cannot move your arm or elbow. These symptoms may represent a serious problem that is an emergency. Do not wait to see if the symptoms will go away. Get medical help right away. Call your local emergency services (911 in the [place] Do not drive yourself to the hospital. Summary Pitcher's elbow is a type of elbow injury that develops gradually over time. Treatment depends on the severity of the injury. Rest your elbow and avoid activities that require overhead arm motions as told by your health care provider. If directed, put ice on the injured area. This information is not intended to replace advice given to you by your health care provider. Make sure you discuss any questions you have with your health care provider. Document Revised: 08/20/2020 Document Reviewed: 08/20/2020 Elsevier  Patient Education  2023 [employer] Inc[employer]  

## 2021-10-26 NOTE — Progress Notes (Signed)
Acute Office Visit  Subjective:     Patient ID: Karina Clark, female    DOB: 17-Jun-1945, 76 y.o.   MRN: [id number]  Chief Complaint  Patient presents with   Elbow Pain    Hand Pain  The incident occurred 3 to 5 days ago. The incident occurred at home. There was no injury mechanism. The pain is present in the right forearm. The quality of the pain is described as aching. Radiates to: Radiates to elbowo. The pain is at a severity of 3/10. The pain is mild. The pain has been Constant since the incident. Pertinent negatives include no chest pain, numbness or tingling. Nothing aggravates the symptoms. She has tried ice for the symptoms. The treatment provided mild relief.  Right elbow joints pops with movement  Review of Systems  Constitutional: Negative.   HENT: Negative.    Eyes: Negative.   Respiratory: Negative.    Cardiovascular: Negative.  Negative for chest pain.  Musculoskeletal:  Positive for joint pain.  Skin: Negative.  Negative for rash.  Neurological:  Negative for tingling and numbness.  All other systems reviewed and are negative.      Objective:    BP (!) 148/70   Pulse 64   Temp 97.7 F (36.5 C)   Ht '4\' 10"'$  (1.473 m)   Wt 98 lb 6.4 oz (44.6 kg)   SpO2 99%   BMI 20.57 kg/m  BP Readings from Last 3 Encounters:  10/26/21 (!) 148/70  09/17/21 140/72  08/25/21 140/64   Wt Readings from Last 3 Encounters:  10/26/21 98 lb 6.4 oz (44.6 kg)  09/17/21 100 lb 6.4 oz (45.5 kg)  08/25/21 101 lb 9.6 oz (46.1 kg)      Physical Exam Vitals and nursing note reviewed.  Constitutional:      Appearance: Normal appearance.  HENT:     Head: Normocephalic.     Nose: Nose normal.     Mouth/Throat:     Mouth: Mucous membranes are moist.  Eyes:     Conjunctiva/sclera: Conjunctivae normal.  Cardiovascular:     Rate and Rhythm: Normal rate and regular rhythm.     Pulses: Normal pulses.     Heart sounds: Normal heart sounds.  Pulmonary:     Effort: Pulmonary  effort is normal.     Breath sounds: Normal breath sounds.  Abdominal:     General: Bowel sounds are normal.  Musculoskeletal:     Right elbow: Decreased range of motion. Tenderness present. No radial head, medial epicondyle, lateral epicondyle or olecranon process tenderness.       Arms:     Comments: Pain and tenderness on right elbow   Skin:    General: Skin is warm.     Findings: No rash.  Neurological:     Mental Status: She is alert and oriented to person, place, and time.  Psychiatric:        Behavior: Behavior normal.    No results found for any visits on 10/26/21.      Assessment & Plan:  Pain in right elbow not well controlled. Take medication as prescribed, rest elbow joint, ice /warm compress as tolerated. X-ray of right elbow completed with results pending. Follow up with worsening unresolved symptoms.  Problem List Items Addressed This Visit   None Visit Diagnoses     Right elbow pain    -  Primary   Relevant Medications   diclofenac Sodium (VOLTAREN) 1 % GEL   Other Relevant Orders  DG Elbow 2 Views Right (Completed)       Meds ordered this encounter  Medications   diclofenac Sodium (VOLTAREN) 1 % GEL    Sig: Apply 2 g topically 4 (four) times daily.    Dispense:  50 g    Refill:  0    Order Specific Question:   Supervising Provider    Answer:   Claretta Fraise [[id number]]    Return if symptoms worsen or fail to improve.  Ivy Lynn, NP

## 2021-10-27 ENCOUNTER — Telehealth: Payer: Self-pay | Admitting: Family Medicine

## 2021-10-27 NOTE — Telephone Encounter (Signed)
REFERRAL REQUEST Telephone Note  Have you been seen at our office for this problem? 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 If they haven't heard anything within a week they should call back and speak with the referral department.

## 2021-10-28 ENCOUNTER — Other Ambulatory Visit: Payer: Self-pay | Admitting: Family Medicine

## 2021-10-28 DIAGNOSIS — Z79899 Other long term (current) drug therapy: Secondary | ICD-10-CM

## 2021-10-28 DIAGNOSIS — F5101 Primary insomnia: Secondary | ICD-10-CM

## 2021-10-29 ENCOUNTER — Other Ambulatory Visit: Payer: Self-pay | Admitting: Family Medicine

## 2021-10-29 DIAGNOSIS — Z79899 Other long term (current) drug therapy: Secondary | ICD-10-CM

## 2021-10-29 DIAGNOSIS — F5101 Primary insomnia: Secondary | ICD-10-CM

## 2021-11-02 ENCOUNTER — Other Ambulatory Visit: Payer: Self-pay | Admitting: Family Medicine

## 2021-11-02 DIAGNOSIS — M19021 Primary osteoarthritis, right elbow: Secondary | ICD-10-CM

## 2021-11-02 NOTE — Telephone Encounter (Signed)
Referral placed, as requested WS 

## 2021-11-11 ENCOUNTER — Encounter: Payer: Self-pay | Admitting: Orthopedic Surgery

## 2021-11-11 ENCOUNTER — Ambulatory Visit (INDEPENDENT_AMBULATORY_CARE_PROVIDER_SITE_OTHER): Payer: Medicare Other | Admitting: Orthopedic Surgery

## 2021-11-11 DIAGNOSIS — M19021 Primary osteoarthritis, right elbow: Secondary | ICD-10-CM | POA: Diagnosis not present

## 2021-11-11 MED ORDER — MELOXICAM 7.5 MG PO TABS: 7.5000 mg | ORAL_TABLET | Freq: Every day | ORAL | 0 refills | Status: DC

## 2021-11-11 NOTE — Progress Notes (Signed)
Office Visit Note   Patient: Karina Clark           Date of Birth: [birth year]           MRN: [id number] Visit Date: 11/11/2021              Requested by: Claretta Fraise, MD [place],  [place] [id number] PCP: Claretta Fraise, MD   Assessment & Plan: Visit Diagnoses:  1. Primary osteoarthritis of right elbow     Plan: We reviewed patient's x-rays which show significant osteoarthritis of the right elbow with significant joint space narrowing, subchondral sclerosis, and large osteophyte formation.  She has significant stiffness secondary to the degenerative changes.  We discussed the nature of elbow arthritis as well as his diagnosis, prognosis, both conservative and surgical treatment options.  After discussion, she would like to start with an oral anti-inflammatory medication for now.  She is not ready for any surgery.  I will send in a prescription for meloxicam to her pharmacy and can see her back as needed.  Follow-Up Instructions: No follow-ups on file.   Orders:  No orders of the defined types were placed in this encounter.  No orders of the defined types were placed in this encounter.     Procedures: No procedures performed   Clinical Data: No additional findings.   Subjective: Chief Complaint  Patient presents with   Right Elbow - Pain    RIGHT Handed, Pain: 1/10, Can't Straighten elbow, states that her right shoulder rolls forward, was very painful they day went and had x-rays    This is a 76 year old right-hand-dominant female who presents with pain and stiffness of the right elbow.  This has been going on for years now but acutely worsened a couple weeks ago.  She notes that she was mowing approximately 2 weeks ago and woke up the next morning with significant pain in the elbow.  Her pain has improved since then.  She mowed on Monday of this week with no subsequent problems.  She denies any injury to this elbow.  She is quite stiff but this does not seem  to bother her functionally.  She lives alone and does all of her own yard work and housework.  She has not had any treatment for this so far.    Review of Systems   Objective: Vital Signs: BP (!) 176/89 (BP Location: Left Arm, Patient Position: Sitting)   Pulse 68   Ht '4\' 10"'$  (1.473 m)   Wt 98 lb 8 oz (44.7 kg)   BMI 20.59 kg/m   Physical Exam Constitutional:      Appearance: Normal appearance.  Cardiovascular:     Rate and Rhythm: Normal rate.     Pulses: Normal pulses.  Pulmonary:     Effort: Pulmonary effort is normal.  Skin:    General: Skin is warm and dry.     Capillary Refill: Capillary refill takes less than 2 seconds.  Neurological:     Mental Status: She is alert.     Right Elbow Exam   Tenderness  The patient is experiencing no tenderness.   Other  Erythema: absent Sensation: normal Pulse: present  Comments:  Elbow ROM from 45-100 degrees.  Chronic appearing elbow deformity with no acute swelling.  Full pronation/supination of the forearm.       Specialty Comments:  No specialty comments available.  Imaging: No results found.   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   Arthritis, degenerative 11/01/2011   Osteoporosis 11/01/2011   Mixed incontinence 11/01/2011   Avitaminosis D 11/01/2011   Osteopenia 11/01/2011   Acid reflux 12/01/2009   Past Medical History:  Diagnosis Date   Anxiety    Cataract    Depression    GERD (gastroesophageal reflux disease)    Hyperlipidemia    Hypertension    Osteopenia    Substance abuse ([place])    DCed cocaine in 1998    Family History  Problem  Relation Age of Onset   Hyperlipidemia Mother    Thyroid disease Mother    Hip fracture Mother    Hypertension Father    Dementia Father    Hypertension Daughter    Cancer Brother        spine   Neuropathy Brother    Breast cancer Neg Hx     Past Surgical History:  Procedure Laterality Date   EYE SURGERY     NASAL SEPTUM SURGERY     TUBAL LIGATION     Social History   Occupational History   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

## 2021-11-17 ENCOUNTER — Other Ambulatory Visit: Payer: Self-pay | Admitting: Family Medicine

## 2021-11-17 NOTE — Telephone Encounter (Signed)
[facility] NTBS 30 days given 10/14/21

## 2021-11-18 ENCOUNTER — Telehealth: Payer: Self-pay | Admitting: Family Medicine

## 2021-11-18 NOTE — Telephone Encounter (Signed)
I put a note in for nurse to call pt bc schedule is blocked until August for [facility].

## 2021-11-18 NOTE — Telephone Encounter (Signed)
APPOINTMENT SCHEDULED

## 2021-11-24 ENCOUNTER — Other Ambulatory Visit: Payer: Self-pay | Admitting: Family Medicine

## 2021-11-24 DIAGNOSIS — L570 Actinic keratosis: Secondary | ICD-10-CM | POA: Diagnosis not present

## 2021-11-24 DIAGNOSIS — D239 Other benign neoplasm of skin, unspecified: Secondary | ICD-10-CM | POA: Diagnosis not present

## 2021-11-30 ENCOUNTER — Other Ambulatory Visit: Payer: Self-pay | Admitting: Orthopedic Surgery

## 2021-11-30 ENCOUNTER — Ambulatory Visit (INDEPENDENT_AMBULATORY_CARE_PROVIDER_SITE_OTHER): Payer: Medicare Other | Admitting: Family Medicine

## 2021-11-30 ENCOUNTER — Encounter: Payer: Self-pay | Admitting: Family Medicine

## 2021-11-30 DIAGNOSIS — M25521 Pain in right elbow: Secondary | ICD-10-CM | POA: Diagnosis not present

## 2021-11-30 DIAGNOSIS — Z79899 Other long term (current) drug therapy: Secondary | ICD-10-CM

## 2021-11-30 DIAGNOSIS — F5101 Primary insomnia: Secondary | ICD-10-CM

## 2021-11-30 MED ORDER — DICLOFENAC SODIUM 1 % EX GEL: 2.0000 g | Freq: Four times a day (QID) | CUTANEOUS | 3 refills | Status: DC | PRN

## 2021-11-30 MED ORDER — ZOLPIDEM TARTRATE 5 MG PO TABS: ORAL_TABLET | ORAL | 1 refills | Status: DC

## 2021-11-30 MED ORDER — VENLAFAXINE HCL ER 75 MG PO CP24: 75.0000 mg | ORAL_CAPSULE | Freq: Every day | ORAL | 3 refills | Status: DC

## 2021-12-03 ENCOUNTER — Ambulatory Visit (INDEPENDENT_AMBULATORY_CARE_PROVIDER_SITE_OTHER): Payer: Medicare Other

## 2021-12-03 VITALS — Wt 99.0 lb

## 2021-12-03 DIAGNOSIS — Z0000 Encounter for general adult medical examination without abnormal findings: Secondary | ICD-10-CM | POA: Diagnosis not present

## 2021-12-03 NOTE — Patient Instructions (Signed)
Karina Clark , Thank you for taking time to come for your Medicare Wellness Visit. I appreciate your ongoing commitment to your health goals. Please review the following plan we discussed and let me know if I can assist you in the future.   Screening recommendations/referrals: Colonoscopy: Done 07/25/2013 - Repeat in 10 years Mammogram: Done 10/06/2021 - antihistamine Bone Density: Done 05/08/2017 - Repeat every 2 years **due Recommended yearly ophthalmology/optometry visit for glaucoma screening and checkup Recommended yearly dental visit for hygiene and checkup  Vaccinations: Influenza vaccine: Done 03/08/2021 - Repeat annually  Pneumococcal vaccine: Done 05/09/2012 & 11/28/2013 Tdap vaccine: Done 10/22/2021 - Repeat in 10 years Shingles vaccine: Done  06/25/2018 & 08/20/2018 Covid-19: Done 09/25/2019, 10/23/2019, 04/24/2020, 09/10/2020, & 03/09/2021  Advanced directives: in chart  Conditions/risks identified: Keep up the great work! Aim for 30 minutes of exercise or brisk walking, 6-8 glasses of water, and 5 servings of fruits and vegetables each day.   Next appointment: Follow up in one year for your annual wellness visit    Preventive Care 65 Years and Older, Female Preventive care refers to lifestyle choices and visits with your health care provider that can promote health and wellness. What does preventive care include? A yearly physical exam. This is also called an annual well check. Dental exams once or twice a year. Routine eye exams. Ask your health care provider how often you should have your eyes checked. Personal lifestyle choices, including: Daily care of your teeth and gums. Regular physical activity. Eating a healthy diet. Avoiding tobacco and drug use. Limiting alcohol use. Practicing safe sex. Taking low-dose aspirin every day. Taking vitamin and mineral supplements as recommended by your health care provider. What happens during an annual well check? The services and  screenings done by your health care provider during your annual well check will depend on your age, overall health, lifestyle risk factors, and family history of disease. Counseling  Your health care provider may ask you questions about your: Alcohol use. Tobacco use. Drug use. Emotional well-being. Home and relationship well-being. Sexual activity. Eating habits. History of falls. Memory and ability to understand (cognition). Work and work Statistician. Reproductive health. Screening  You may have the following tests or measurements: Height, weight, and BMI. Blood pressure. Lipid and cholesterol levels. These may be checked every 5 years, or more frequently if you are over 65 years old. Skin check. Lung cancer screening. You may have this screening every year starting at age 60 if you have a 30-pack-year history of smoking and currently smoke or have quit within the past 15 years. Fecal occult blood test (FOBT) of the stool. You may have this test every year starting at age 37. Flexible sigmoidoscopy or colonoscopy. You may have a sigmoidoscopy every 5 years or a colonoscopy every 10 years starting at age 76. Hepatitis C blood test. Hepatitis B blood test. Sexually transmitted disease (STD) testing. Diabetes screening. This is done by checking your blood sugar (glucose) after you have not eaten for a while (fasting). You may have this done every 1-3 years. Bone density scan. This is done to screen for osteoporosis. You may have this done starting at age 31. Mammogram. This may be done every 1-2 years. Talk to your health care provider about how often you should have regular mammograms. Talk with your health care provider about your test results, treatment options, and if necessary, the need for more tests. Vaccines  Your health care provider may recommend certain vaccines, such as: Influenza  vaccine. This is recommended every year. Tetanus, diphtheria, and acellular pertussis (Tdap,  Td) vaccine. You may need a Td booster every 10 years. Zoster vaccine. You may need this after age 68. Pneumococcal 13-valent conjugate (PCV13) vaccine. One dose is recommended after age 1. Pneumococcal polysaccharide (PPSV23) vaccine. One dose is recommended after age 56. Talk to your health care provider about which screenings and vaccines you need and how often you need them. This information is not intended to replace advice given to you by your health care provider. Make sure you discuss any questions you have with your health care provider. Document Released: 06/19/2015 Document Revised: 02/10/2016 Document Reviewed: 03/24/2015 Elsevier Interactive Patient Education  2017 [employer] Prevention in the Home Falls can cause injuries. They can happen to people of all ages. There are many things you can do to make your home safe and to help prevent falls. What can I do on the outside of my home? Regularly fix the edges of walkways and driveways and fix any cracks. Remove anything that might make you trip as you walk through a door, such as a raised step or threshold. Trim any bushes or trees on the path to your home. Use bright outdoor lighting. Clear any walking paths of anything that might make someone trip, such as rocks or tools. Regularly check to see if handrails are loose or broken. Make sure that both sides of any steps have handrails. Any raised decks and porches should have guardrails on the edges. Have any leaves, snow, or ice cleared regularly. Use sand or salt on walking paths during winter. Clean up any spills in your garage right away. This includes oil or grease spills. What can I do in the bathroom? Use night lights. Install grab bars by the toilet and in the tub and shower. Do not use towel bars as grab bars. Use non-skid mats or decals in the tub or shower. If you need to sit down in the shower, use a plastic, non-slip stool. Keep the floor dry. Clean up any  water that spills on the floor as soon as it happens. Remove soap buildup in the tub or shower regularly. Attach bath mats securely with double-sided non-slip rug tape. Do not have throw rugs and other things on the floor that can make you trip. What can I do in the bedroom? Use night lights. Make sure that you have a light by your bed that is easy to reach. Do not use any sheets or blankets that are too big for your bed. They should not hang down onto the floor. Have a firm chair that has side arms. You can use this for support while you get dressed. Do not have throw rugs and other things on the floor that can make you trip. What can I do in the kitchen? Clean up any spills right away. Avoid walking on wet floors. Keep items that you use a lot in easy-to-reach places. If you need to reach something above you, use a strong step stool that has a grab bar. Keep electrical cords out of the way. Do not use floor polish or wax that makes floors slippery. If you must use wax, use non-skid floor wax. Do not have throw rugs and other things on the floor that can make you trip. What can I do with my stairs? Do not leave any items on the stairs. Make sure that there are handrails on both sides of the stairs and use them. Fix  handrails that are broken or loose. Make sure that handrails are as long as the stairways. Check any carpeting to make sure that it is firmly attached to the stairs. Fix any carpet that is loose or worn. Avoid having throw rugs at the top or bottom of the stairs. If you do have throw rugs, attach them to the floor with carpet tape. Make sure that you have a light switch at the top of the stairs and the bottom of the stairs. If you do not have them, ask someone to add them for you. What else can I do to help prevent falls? Wear shoes that: Do not have high heels. Have rubber bottoms. Are comfortable and fit you well. Are closed at the toe. Do not wear sandals. If you use a  stepladder: Make sure that it is fully opened. Do not climb a closed stepladder. Make sure that both sides of the stepladder are locked into place. Ask someone to hold it for you, if possible. Clearly mark and make sure that you can see: Any grab bars or handrails. First and last steps. Where the edge of each step is. Use tools that help you move around (mobility aids) if they are needed. These include: Canes. Walkers. Scooters. Crutches. Turn on the lights when you go into a dark area. Replace any light bulbs as soon as they burn out. Set up your furniture so you have a clear path. Avoid moving your furniture around. If any of your floors are uneven, fix them. If there are any pets around you, be aware of where they are. Review your medicines with your doctor. Some medicines can make you feel dizzy. This can increase your chance of falling. Ask your doctor what other things that you can do to help prevent falls. This information is not intended to replace advice given to you by your health care provider. Make sure you discuss any questions you have with your health care provider. Document Released: 03/19/2009 Document Revised: 10/29/2015 Document Reviewed: 06/27/2014 Elsevier Interactive Patient Education  2017 [employer].

## 2021-12-03 NOTE — Progress Notes (Signed)
Subjective:   Karina Clark is a 76 y.o. female who presents for Medicare Annual (Subsequent) preventive examination.  Virtual Visit via Telephone Note  I connected with  Karina Clark on 12/03/21 at  9:45 AM EDT by telephone and verified that I am speaking with the correct person using two identifiers.  Location: Patient: Home Provider: [facility] Persons participating in the virtual visit: patient/Nurse Health Advisor   I discussed the limitations, risks, security and privacy concerns of performing an evaluation and management service by telephone and the availability of in person appointments. The patient expressed understanding and agreed to proceed.  Interactive audio and video telecommunications were attempted between this nurse and patient, however failed, due to patient having technical difficulties OR patient did not have access to video capability.  We continued and completed visit with audio only.  Some vital signs may be absent or patient reported.   Karina Clark E Geovany Trudo, LPN   Review of Systems     Cardiac Risk Factors include: advanced age (>31mn, >>49women);dyslipidemia     Objective:    Today's Vitals   12/03/21 0945  Weight: 99 lb (44.9 kg)   Body mass index is 20.69 kg/m.     12/03/2021    9:51 AM 12/02/2019   10:26 AM 11/29/2018   10:12 AM 08/07/2017    2:26 PM 11/17/2015   11:25 AM  Advanced Directives  Does Patient Have a Medical Advance Directive? Yes Yes Yes Yes No  Type of [place] will Healthcare Power of Attorney Living will;Healthcare Power of [place] will   Does patient want to make changes to medical advance directive?  No - Patient declined No - Patient declined No - Patient declined   Copy of [place] Chart? Yes - validated most recent copy scanned in chart (See row information) No - copy requested No - copy requested No - copy requested   Would  patient like information on creating a medical advance directive?   No - Patient declined  No - patient declined information;Yes - Educational materials given    Current Medications (verified) Outpatient Encounter Medications as of 12/03/2021  Medication Sig   aspirin EC 81 MG tablet Take 81 mg by mouth daily.   atorvastatin (LIPITOR) 20 MG tablet Take 1 tablet (20 mg total) by mouth daily. for cholesterol.   diclofenac Sodium (VOLTAREN) 1 % GEL Apply 2 g topically 4 (four) times daily as needed (right shoulder and elbow pain).   ketoconazole (NIZORAL) 2 % cream Apply 1 application topically daily.   meloxicam (MOBIC) 7.5 MG tablet Take 1 tablet by mouth once daily   Multiple Vitamins-Minerals (EQ VISION FORMULA 50+) CAPS Take 1 capsule by mouth.   Omega-3 1000 MG CAPS Take 3 g by mouth daily.    pantoprazole (PROTONIX) 40 MG tablet TAKE 1 TABLET BY MOUTH ONCE DAILY FOR  STOMACH (NEEDS TO BE SEEN BEFORE NEXT REFILL)   venlafaxine XR (EFFEXOR-XR) 75 MG 24 hr capsule Take 1 capsule (75 mg total) by mouth at bedtime.   Vitamin D, Cholecalciferol, 1000 UNITS CAPS Take 2,000 Units by mouth daily.    zolpidem (AMBIEN) 5 MG tablet TAKE 1 TABLET BY MOUTH AT BEDTIME   No facility-administered encounter medications on file as of 12/03/2021.    Allergies (verified) Seroquel [quetiapine], Mycelex [clotrimazole], Naproxen, and Sulfamethoxazole-trimethoprim   History: Past Medical History:  Diagnosis Date   Anxiety    Cataract  Depression    GERD (gastroesophageal reflux disease)    Hyperlipidemia    Hypertension    Osteopenia    Substance abuse ([place])    DCed cocaine in 1998   Past Surgical History:  Procedure Laterality Date   EYE SURGERY     NASAL SEPTUM SURGERY     TUBAL LIGATION     Family History  Problem Relation Age of Onset   Hyperlipidemia Mother    Thyroid disease Mother    Hip fracture Mother    Hypertension Father    Dementia Father    Hypertension Daughter    Cancer  Brother        spine   Neuropathy Brother    Breast cancer Neg Hx    Social History   Socioeconomic History   Marital status: Widowed    Spouse name: Not on file   Number of children: 1   Years of education: 93   Highest education level: 12th grade  Occupational History   Occupation: Caregiver    Comment: retired  Tobacco Use   Smoking status: Former    Packs/day: 0.50    Types: Cigarettes    Start date: 03/24/1969    Quit date: 02/05/1995    Years since quitting: 26.8   Smokeless tobacco: Never  Vaping Use   Vaping Use: Never used  Substance and Sexual Activity   Alcohol use: No    Alcohol/week: 0.0 standard drinks of alcohol   Drug use: No   Sexual activity: Not Currently  Other Topics Concern   Not on file  Social History Narrative   Widowed since 1998. She has one adult daughter and 2 grandchildren. She enjoys reading. She lives in a one story home with steps up onto her deck with a handrail.    Her daughter has mental health issues - she stays worried about her and she is helping care for her, going to her house daily   Social Determinants of Health   Financial Resource Strain: Low Risk  (12/03/2021)   Overall Financial Resource Strain (CARDIA)    Difficulty of Paying Living Expenses: Not hard at all  Food Insecurity: No Food Insecurity (12/03/2021)   Hunger Vital Sign    Worried About Running Out of Food in the Last Year: Never true    [place] in the Last Year: Never true  Transportation Needs: No Transportation Needs (12/03/2021)   PRAPARE - Hydrologist (Medical): No    Lack of Transportation (Non-Medical): No  Physical Activity: Sufficiently Active (12/03/2021)   Exercise Vital Sign    Days of Exercise per Week: 7 days    Minutes of Exercise per Session: 50 min  Stress: No Stress Concern Present (12/03/2021)   [place]    Feeling of Stress : Only a little   Social Connections: Moderately Integrated (12/03/2021)   Social Connection and Isolation Panel [NHANES]    Frequency of Communication with Friends and Family: More than three times a week    Frequency of Social Gatherings with Friends and Family: More than three times a week    Attends Religious Services: More than 4 times per year    Active Member of [employer] or Organizations: Yes    Attends Archivist Meetings: More than 4 times per year    Marital Status: Widowed    Tobacco Counseling Counseling given: Not Answered   Clinical Intake:  Pre-visit preparation  completed: Yes  Pain : No/denies pain     BMI - recorded: 20.69 Nutritional Status: BMI of 19-24  Normal Nutritional Risks: None Diabetes: No  How often do you need to have someone help you when you read instructions, pamphlets, or other written materials from your doctor or pharmacy?: 1 - Never  Diabetic? no  Interpreter Needed?: No  Information entered by :: Bandon Sherwin, LPN   Activities of Daily Living    12/03/2021    9:50 AM  In your present state of health, do you have any difficulty performing the following activities:  Hearing? 0  Vision? 0  Difficulty concentrating or making decisions? 0  Walking or climbing stairs? 0  Dressing or bathing? 0  Doing errands, shopping? 0  Preparing Food and eating ? N  Using the Toilet? N  In the past six months, have you accidently leaked urine? Y  Comment wears pads for protection - intermittent  Do you have problems with loss of bowel control? N  Managing your Medications? N  Managing your Finances? N  Housekeeping or managing your Housekeeping? N    Patient Care Team: Claretta Fraise, MD as PCP - General (Family Medicine) Sandford Craze, MD as Referring Physician (Dermatology) Harlen Labs, MD as Referring Physician (Optometry)  Indicate any recent Medical Services you may have received from other than [facility] providers in the past year (date may  be approximate).     Assessment:   This is a routine wellness examination for [facility].  Hearing/Vision screen Hearing Screening - Comments:: Denies hearing difficulties   Vision Screening - Comments:: Wears rx glasses - up to date with routine eye exams with [facility]  Dietary issues and exercise activities discussed: Current Exercise Habits: Home exercise routine, Type of exercise: walking, Time (Minutes): 45, Frequency (Times/Week): 6, Weekly Exercise (Minutes/Week): 270, Intensity: Mild, Exercise limited by: orthopedic condition(s)   Goals Addressed             This Visit's Progress    Exercise 3x per week (30 min per time)   On track    Continue walking (currently at 45 minutes most days)  Stay active and independent (does not ever want to have to be taken care of)     Patient Stated   On track    12/03/2021 AWV Goal: Fall Prevention  Over the next year, patient will decrease their risk for falls by: Using assistive devices, such as a cane or walker, as needed Identifying fall risks within their home and correcting them by: Removing throw rugs Adding handrails to stairs or ramps Removing clutter and keeping a clear pathway throughout the home Increasing light, especially at night Adding shower handles/bars Raising toilet seat Identifying potential personal risk factors for falls: Medication side effects Incontinence/urgency Vestibular dysfunction Hearing loss Musculoskeletal disorders Neurological disorders Orthostatic hypotension         Depression Screen    12/03/2021    9:49 AM 11/30/2021   11:07 AM 10/26/2021   12:20 PM 09/17/2021    3:55 PM 08/25/2021   11:06 AM 07/12/2021    2:00 PM 07/12/2021    1:51 PM  PHQ 2/9 Scores  PHQ - 2 Score 2 0 '2 1 2 2 '$ 0  PHQ- 9 Score '3 1 4 2 5 9     '$ Fall Risk    12/03/2021    9:46 AM 11/30/2021   11:04 AM 10/26/2021   12:20 PM 08/25/2021   10:39 AM 07/12/2021  1:51 PM  [place] in the past year? 0 0  0 0 0  Number falls in past yr: 0      Injury with Fall? 0      Risk for fall due to : Orthopedic patient      Follow up Falls prevention discussed  Falls evaluation completed      [place]:  Any stairs in or around the home? No  If so, are there any without handrails? No  Home free of loose throw rugs in walkways, pet beds, electrical cords, etc? Yes  Adequate lighting in your home to reduce risk of falls? Yes   ASSISTIVE DEVICES UTILIZED TO PREVENT FALLS:  Life alert? No  Use of a cane, walker or w/c? No  Grab bars in the bathroom? No  Shower chair or bench in shower? No  Elevated toilet seat or a handicapped toilet? No   TIMED UP AND GO:  Was the test performed? No . Telephonic visit  Cognitive Function:    08/07/2017    2:40 PM 11/17/2015   11:38 AM  MMSE - Mini Mental State Exam  Orientation to time 5 5  Orientation to Place 5 5  Registration 3 3  Attention/ Calculation 5 5  Recall 3 3  Language- name 2 objects 2 2  Language- repeat 1 1  Language- follow 3 step command 3 3  Language- read & follow direction 1 1  Write a sentence 1 1  Copy design 1 1  Total score 30 30        12/03/2021    9:53 AM 12/02/2019   10:35 AM 11/29/2018   10:17 AM  6CIT Screen  What Year? 0 points 0 points 0 points  What month? 0 points  0 points  What time? 0 points 0 points 0 points  Count back from 20 0 points 0 points 0 points  Months in reverse 0 points 0 points 2 points  Repeat phrase 0 points 0 points 0 points  Total Score 0 points  2 points    Immunizations Immunization History  Administered Date(s) Administered   Fluad Quad(high Dose 65+) 02/19/2019, 03/13/2020, 03/08/2021   Hepatitis A, Adult 11/09/2005   Influenza, High Dose Seasonal PF 02/23/2016, 02/27/2017, 03/14/2018   Influenza,inj,Quad PF,6+ Mos 03/05/2015   Moderna SARS-COV2 Booster Vaccination 09/10/2020, 03/09/2021   Moderna Sars-Covid-2 Vaccination 09/25/2019,  10/23/2019, 04/24/2020   Pneumococcal Conjugate-13 11/28/2013   Pneumococcal Polysaccharide-23 05/09/2012   Zoster Recombinat (Shingrix) 06/25/2018, 06/25/2018, 08/20/2018   Zoster, Live 01/07/2008    TDAP status: Up to date  Flu Vaccine status: Up to date  Pneumococcal vaccine status: Up to date  Covid-19 vaccine status: Completed vaccines  Qualifies for Shingles Vaccine? Yes   Zostavax completed Yes   Shingrix Completed?: Yes  Screening Tests Health Maintenance  Topic Date Due   TETANUS/TDAP  06/28/2015   DEXA SCAN  05/09/2019   COVID-19 Vaccine (4 - Booster for Moderna series) 05/04/2021   INFLUENZA VACCINE  01/04/2022   COLONOSCOPY (Pts 45-60yr Insurance coverage will need to be confirmed)  07/26/2023   MAMMOGRAM  10/07/2023   Pneumonia Vaccine 75 Years old  Completed   Hepatitis C Screening  Completed   Zoster Vaccines- Shingrix  Completed   HPV VACCINES  Aged Out    Health Maintenance  Health Maintenance Due  Topic Date Due   TETANUS/TDAP  06/28/2015   DEXA SCAN  05/09/2019   COVID-19 Vaccine (  4 - Booster for Moderna series) 05/04/2021    Colorectal cancer screening: Type of screening: Colonoscopy. Completed 07/25/2013. Repeat every 10 years  Mammogram status: Completed 10/06/2021. Repeat every year  Bone Density status: Completed 05/08/2017. Results reflect: Bone density results: OSTEOPENIA. Repeat every 2 years.  Lung Cancer Screening: (Low Dose CT Chest recommended if Age 83-80 years, 30 pack-year currently smoking OR have quit w/in 15years.) does not qualify  Additional Screening:  Hepatitis C Screening: does qualify; Completed 11/17/2015  Vision Screening: Recommended annual ophthalmology exams for early detection of glaucoma and other disorders of the eye. Is the patient up to date with their annual eye exam?  Yes  Who is the provider or what is the name of the office in which the patient attends annual eye exams? [facility] If pt is  not established with a provider, would they like to be referred to a provider to establish care? No .   Dental Screening: Recommended annual dental exams for proper oral hygiene  Community Resource Referral / Chronic Care Management: CRR required this visit?  No   CCM required this visit?  No      Plan:     I have personally reviewed and noted the following in the patient's chart:   Medical and social history Use of alcohol, tobacco or illicit drugs  Current medications and supplements including opioid prescriptions.  Functional ability and status Nutritional status Physical activity Advanced directives List of other physicians Hospitalizations, surgeries, and ER visits in previous 12 months Vitals Screenings to include cognitive, depression, and falls Referrals and appointments  In addition, I have reviewed and discussed with patient certain preventive protocols, quality metrics, and best practice recommendations. A written personalized care plan for preventive services as well as general preventive health recommendations were provided to patient.     Sandrea Hammond, LPN   09/27/5359   Nurse Notes: None

## 2022-01-26 ENCOUNTER — Ambulatory Visit: Payer: Medicare Other | Admitting: Family Medicine

## 2022-01-26 ENCOUNTER — Ambulatory Visit (INDEPENDENT_AMBULATORY_CARE_PROVIDER_SITE_OTHER): Payer: Medicare Other | Admitting: Family Medicine

## 2022-01-26 ENCOUNTER — Encounter: Payer: Self-pay | Admitting: Family Medicine

## 2022-01-26 VITALS — BP 138/68 | HR 76 | Temp 97.8°F | Ht <= 58 in | Wt 97.8 lb

## 2022-01-26 DIAGNOSIS — K5909 Other constipation: Secondary | ICD-10-CM

## 2022-01-26 DIAGNOSIS — K623 Rectal prolapse: Secondary | ICD-10-CM | POA: Diagnosis not present

## 2022-01-26 MED ORDER — LUBIPROSTONE 24 MCG PO CAPS: 24.0000 ug | ORAL_CAPSULE | Freq: Two times a day (BID) | ORAL | 5 refills | Status: DC

## 2022-01-26 NOTE — Progress Notes (Signed)
Subjective:  Patient ID: Karina Clark, female    DOB: 1945-08-26  Age: 76 y.o. MRN: [id number]  CC: Constipation   HPI Karina Clark presents for lifelong constipation that is getting worse. Having abd pain. Reports having rectal prolapse visible at times. Gets hard balls of stool that she has to extract manually. Then has rectal  pain. Not yet due for colonoscopy.Has used probiotics, activia BID. Using magnesium 400 mg tab with no success. Hasn't used colace, miral ax or metamucil.   Linzess didn't help in the past and was too expensive.      01/26/2022   11:31 AM 12/03/2021    9:49 AM 11/30/2021   11:07 AM  Depression screen PHQ 2/9  Decreased Interest 0 1 0  Down, Depressed, Hopeless 0 1 0  PHQ - 2 Score 0 2 0  Altered sleeping 0 0 0  Tired, decreased energy 0 0 0  Change in appetite 0 0 0  Feeling bad or failure about yourself  0 0 0  Trouble concentrating 0 1 1  Moving slowly or fidgety/restless 0 0 0  Suicidal thoughts 0 0 0  PHQ-9 Score 0 3 1  Difficult doing work/chores Not difficult at all Somewhat difficult Not difficult at all    History [name] has a past medical history of Anxiety, Cataract, Depression, GERD (gastroesophageal reflux disease), Hyperlipidemia, Hypertension, Osteopenia, and Substance abuse ([place]).   She has a past surgical history that includes Tubal ligation; Eye surgery; and Nasal septum surgery.   Her family history includes Cancer in her brother; Dementia in her father; Hip fracture in her mother; Hyperlipidemia in her mother; Hypertension in her daughter and father; Neuropathy in her brother; Thyroid disease in her mother.She reports that she quit smoking about 26 years ago. Her smoking use included cigarettes. She started smoking about 52 years ago. She smoked an average of .5 packs per day. She has never used smokeless tobacco. She reports that she does not drink alcohol and does not use drugs.    ROS Review of Systems  Objective:  BP 138/68    Pulse 76   Temp 97.8 F (36.6 C)   Ht '4\' 10"'$  (1.473 m)   Wt 97 lb 12.8 oz (44.4 kg)   SpO2 100%   BMI 20.44 kg/m   BP Readings from Last 3 Encounters:  01/26/22 138/68  11/11/21 (!) 176/89  10/26/21 (!) 148/70    Wt Readings from Last 3 Encounters:  01/26/22 97 lb 12.8 oz (44.4 kg)  12/03/21 99 lb (44.9 kg)  11/30/21 99 lb 6.4 oz (45.1 kg)     Physical Exam    Assessment & Plan:   There are no diagnoses linked to this encounter.     I am having Karina Clark. Julien Girt "Karle Starch" maintain her aspirin EC, Vitamin D (Cholecalciferol), Omega-3, EQ Vision Formula 50+, atorvastatin, ketoconazole, pantoprazole, venlafaxine XR, zolpidem, diclofenac Sodium, and meloxicam.  Allergies as of 01/26/2022       Reactions   Seroquel [quetiapine]    Drowsiness the next morning   Mycelex [clotrimazole] Other (See Comments)   Worsened GERD / nausea   Naproxen Other (See Comments)   "Makes my stomach burn"   Sulfamethoxazole-trimethoprim Nausea And Vomiting        Medication List        Accurate as of January 26, 2022 11:39 AM. If you have any questions, ask your nurse or doctor.          aspirin  EC 81 MG tablet Take 81 mg by mouth daily.   atorvastatin 20 MG tablet Commonly known as: LIPITOR Take 1 tablet (20 mg total) by mouth daily. for cholesterol.   diclofenac Sodium 1 % Gel Commonly known as: Voltaren Apply 2 g topically 4 (four) times daily as needed (right shoulder and elbow pain).   EQ Vision Formula 50+ Caps Take 1 capsule by mouth.   ketoconazole 2 % cream Commonly known as: NIZORAL Apply 1 application topically daily.   meloxicam 7.5 MG tablet Commonly known as: MOBIC Take 1 tablet by mouth once daily   Omega-3 1000 MG Caps Take 3 g by mouth daily.   pantoprazole 40 MG tablet Commonly known as: PROTONIX TAKE 1 TABLET BY MOUTH ONCE DAILY FOR  STOMACH (NEEDS TO BE SEEN BEFORE NEXT REFILL)   venlafaxine XR 75 MG 24 hr capsule Commonly known as:  EFFEXOR-XR Take 1 capsule (75 mg total) by mouth at bedtime.   Vitamin D (Cholecalciferol) 25 MCG (1000 UT) Caps Take 2,000 Units by mouth daily.   zolpidem 5 MG tablet Commonly known as: AMBIEN TAKE 1 TABLET BY MOUTH AT BEDTIME         Follow-up: No follow-ups on file.  Claretta Fraise, M.D.

## 2022-01-26 NOTE — Patient Instructions (Signed)
Use Metamucil 1 tablespoon twice a day in fruit juice of choice. Take Colace (or store brand) 100 mg twice a day

## 2022-02-02 ENCOUNTER — Encounter: Payer: Self-pay | Admitting: *Deleted

## 2022-02-09 ENCOUNTER — Ambulatory Visit (INDEPENDENT_AMBULATORY_CARE_PROVIDER_SITE_OTHER): Payer: Medicare Other | Admitting: Family Medicine

## 2022-02-09 ENCOUNTER — Encounter: Payer: Self-pay | Admitting: Family Medicine

## 2022-02-09 VITALS — BP 128/66 | HR 66 | Temp 97.3°F | Ht <= 58 in | Wt 97.6 lb

## 2022-02-09 DIAGNOSIS — F419 Anxiety disorder, unspecified: Secondary | ICD-10-CM | POA: Diagnosis not present

## 2022-02-09 DIAGNOSIS — F5101 Primary insomnia: Secondary | ICD-10-CM | POA: Diagnosis not present

## 2022-02-09 DIAGNOSIS — E782 Mixed hyperlipidemia: Secondary | ICD-10-CM

## 2022-02-09 DIAGNOSIS — M19021 Primary osteoarthritis, right elbow: Secondary | ICD-10-CM

## 2022-02-09 DIAGNOSIS — Z79899 Other long term (current) drug therapy: Secondary | ICD-10-CM

## 2022-02-09 MED ORDER — ATORVASTATIN CALCIUM 20 MG PO TABS: 20.0000 mg | ORAL_TABLET | Freq: Every day | ORAL | 3 refills | Status: DC

## 2022-02-09 NOTE — Addendum Note (Signed)
Addended by: Claretta Fraise on: 02/09/2022 02:13 PM   Modules accepted: Orders

## 2022-02-09 NOTE — Progress Notes (Addendum)
Subjective:  Patient ID: Karina Clark, female    DOB: 1945/11/10  Age: 76 y.o. MRN: [id number]  CC: Medical Management of Chronic Issues   HPI DEASHA Clark presents for insomnia. Ambien working adequately. Time to renew agreement.  Colace helps with stool. Taking prn. Usually q3 days. Couldn't afford the amitiza.    in for follow-up of elevated cholesterol. Doing well without complaints on current medication. Denies side effects of statin including myalgia and arthralgia and nausea. Currently no chest pain, shortness of breath or other cardiovascular related symptoms noted.      02/09/2022    1:38 PM 02/09/2022    1:35 PM 01/26/2022   11:31 AM  Depression screen PHQ 2/9  Decreased Interest 1 0 0  Down, Depressed, Hopeless 1 0 0  PHQ - 2 Score 2 0 0  Altered sleeping 1  0  Tired, decreased energy 1  0  Change in appetite 0  0  Feeling bad or failure about yourself  0  0  Trouble concentrating 0  0  Moving slowly or fidgety/restless 0  0  Suicidal thoughts 0  0  PHQ-9 Score 4  0  Difficult doing work/chores Not difficult at all  Not difficult at all    History Braylea has a past medical history of Anxiety, Cataract, Depression, GERD (gastroesophageal reflux disease), Hyperlipidemia, Hypertension, Osteopenia, and Substance abuse ([place]).   She has a past surgical history that includes Tubal ligation; Eye surgery; and Nasal septum surgery.   Her family history includes Cancer in her brother; Dementia in her father; Hip fracture in her mother; Hyperlipidemia in her mother; Hypertension in her daughter and father; Neuropathy in her brother; Thyroid disease in her mother.She reports that she quit smoking about 27 years ago. Her smoking use included cigarettes. She started smoking about 52 years ago. She smoked an average of .5 packs per day. She has never used smokeless tobacco. She reports that she does not drink alcohol and does not use drugs.    ROS Review of Systems   Constitutional: Negative.   HENT: Negative.    Eyes:  Negative for visual disturbance.  Respiratory:  Negative for shortness of breath.   Cardiovascular:  Negative for chest pain.  Gastrointestinal:  Negative for abdominal pain.  Musculoskeletal:  Negative for arthralgias.    Objective:  BP 128/66   Pulse 66   Temp (!) 97.3 F (36.3 C)   Ht 4' 10"  (1.473 m)   Wt 97 lb 9.6 oz (44.3 kg)   SpO2 98%   BMI 20.40 kg/m   BP Readings from Last 3 Encounters:  02/09/22 128/66  01/26/22 138/68  11/11/21 (!) 176/89    Wt Readings from Last 3 Encounters:  02/09/22 97 lb 9.6 oz (44.3 kg)  01/26/22 97 lb 12.8 oz (44.4 kg)  12/03/21 99 lb (44.9 kg)     Physical Exam Constitutional:      General: She is not in acute distress.    Appearance: She is well-developed.  Cardiovascular:     Rate and Rhythm: Normal rate and regular rhythm.  Pulmonary:     Breath sounds: Normal breath sounds.  Musculoskeletal:        General: Normal range of motion.  Skin:    General: Skin is warm and dry.  Neurological:     Mental Status: She is alert and oriented to person, place, and time.       Assessment & Plan:   [name] was seen today  for medical management of chronic issues.  Diagnoses and all orders for this visit:  Primary insomnia  Controlled substance agreement signed -     ToxASSURE Select 13 (MW), Urine  Long term prescription benzodiazepine use  Primary osteoarthritis of right elbow  Anxiety  Mixed hyperlipidemia -     CBC with Differential/Platelet -     CMP14+EGFR -     Lipid panel  Other orders -     atorvastatin (LIPITOR) 20 MG tablet; Take 1 tablet (20 mg total) by mouth daily. for cholesterol.       I have discontinued Cresenciano Lick. Leccese "Karina Clark"'s meloxicam and lubiprostone. I am also having her maintain her aspirin EC, Vitamin D (Cholecalciferol), Omega-3, EQ Vision Formula 50+, ketoconazole, pantoprazole, venlafaxine XR, zolpidem, diclofenac Sodium, and  atorvastatin.  Allergies as of 02/09/2022       Reactions   Seroquel [quetiapine]    Drowsiness the next morning   Mycelex [clotrimazole] Other (See Comments)   Worsened GERD / nausea   Naproxen Other (See Comments)   "Makes my stomach burn"   Sulfamethoxazole-trimethoprim Nausea And Vomiting        Medication List        Accurate as of February 09, 2022  2:13 PM. If you have any questions, ask your nurse or doctor.          STOP taking these medications    lubiprostone 24 MCG capsule Commonly known as: AMITIZA Stopped by: Claretta Fraise, MD   meloxicam 7.5 MG tablet Commonly known as: MOBIC Stopped by: Claretta Fraise, MD       TAKE these medications    aspirin EC 81 MG tablet Take 81 mg by mouth daily.   atorvastatin 20 MG tablet Commonly known as: LIPITOR Take 1 tablet (20 mg total) by mouth daily. for cholesterol.   diclofenac Sodium 1 % Gel Commonly known as: Voltaren Apply 2 g topically 4 (four) times daily as needed (right shoulder and elbow pain).   EQ Vision Formula 50+ Caps Take 1 capsule by mouth.   ketoconazole 2 % cream Commonly known as: NIZORAL Apply 1 application topically daily.   Omega-3 1000 MG Caps Take 3 g by mouth daily.   pantoprazole 40 MG tablet Commonly known as: PROTONIX TAKE 1 TABLET BY MOUTH ONCE DAILY FOR  STOMACH (NEEDS TO BE SEEN BEFORE NEXT REFILL)   venlafaxine XR 75 MG 24 hr capsule Commonly known as: EFFEXOR-XR Take 1 capsule (75 mg total) by mouth at bedtime.   Vitamin D (Cholecalciferol) 25 MCG (1000 UT) Caps Take 2,000 Units by mouth daily.   zolpidem 5 MG tablet Commonly known as: AMBIEN TAKE 1 TABLET BY MOUTH AT BEDTIME         Follow-up: Return in about 6 months (around 08/10/2022).  Claretta Fraise, M.D.

## 2022-02-10 LAB — CMP14+EGFR
ALT: 14 IU/L (ref 0–32)
AST: 19 IU/L (ref 0–40)
Albumin/Globulin Ratio: 2.5 — ABNORMAL HIGH (ref 1.2–2.2)
Albumin: 4.7 g/dL (ref 3.8–4.8)
Alkaline Phosphatase: 78 IU/L (ref 44–121)
BUN/Creatinine Ratio: 14 (ref 12–28)
BUN: 14 mg/dL (ref 8–27)
Bilirubin Total: 0.4 mg/dL (ref 0.0–1.2)
CO2: 26 mmol/L (ref 20–29)
Calcium: 9.9 mg/dL (ref 8.7–10.3)
Chloride: 100 mmol/L (ref 96–106)
Creatinine, Ser: 0.99 mg/dL (ref 0.57–1.00)
Globulin, Total: 1.9 g/dL (ref 1.5–4.5)
Glucose: 84 mg/dL (ref 70–99)
Potassium: 5.4 mmol/L — ABNORMAL HIGH (ref 3.5–5.2)
Sodium: 140 mmol/L (ref 134–144)
Total Protein: 6.6 g/dL (ref 6.0–8.5)
eGFR: 60 mL/min/{1.73_m2} (ref >59)

## 2022-02-10 LAB — LIPID PANEL
Chol/HDL Ratio: 2.3 ratio (ref 0.0–4.4)
Cholesterol, Total: 180 mg/dL (ref 100–199)
HDL: 78 mg/dL (ref >39)
LDL Chol Calc (NIH): 88 mg/dL (ref 0–99)
Triglycerides: 73 mg/dL (ref 0–149)
VLDL Cholesterol Cal: 14 mg/dL (ref 5–40)

## 2022-02-10 LAB — CBC WITH DIFFERENTIAL/PLATELET
Basophils Absolute: 0 10*3/uL (ref 0.0–0.2)
Basos: 0 %
EOS (ABSOLUTE): 0.1 10*3/uL (ref 0.0–0.4)
Eos: 1 %
Hematocrit: 35.2 % (ref 34.0–46.6)
Hemoglobin: 11.9 g/dL (ref 11.1–15.9)
Immature Grans (Abs): 0 10*3/uL (ref 0.0–0.1)
Immature Granulocytes: 0 %
Lymphocytes Absolute: 2 10*3/uL (ref 0.7–3.1)
Lymphs: 45 %
MCH: 32.1 pg (ref 26.6–33.0)
MCHC: 33.8 g/dL (ref 31.5–35.7)
MCV: 95 fL (ref 79–97)
Monocytes Absolute: 0.5 10*3/uL (ref 0.1–0.9)
Monocytes: 11 %
Neutrophils Absolute: 1.9 10*3/uL (ref 1.4–7.0)
Neutrophils: 43 %
Platelets: 240 10*3/uL (ref 150–450)
RBC: 3.71 x10E6/uL — ABNORMAL LOW (ref 3.77–5.28)
RDW: 12 % (ref 11.7–15.4)
WBC: 4.5 10*3/uL (ref 3.4–10.8)

## 2022-02-10 NOTE — Progress Notes (Signed)
Hello  Myah,    Your lab result is normal and/or stable.Some minor variations that are not significant are commonly marked abnormal, but do not represent any medical problem for you.   Best regards,  Nicoletta Hush, M.D.

## 2022-02-12 LAB — TOXASSURE SELECT 13 (MW), URINE

## 2022-02-17 ENCOUNTER — Ambulatory Visit: Payer: Medicare Other | Admitting: Gastroenterology

## 2022-02-24 ENCOUNTER — Encounter: Payer: Self-pay | Admitting: Gastroenterology

## 2022-02-24 ENCOUNTER — Ambulatory Visit (INDEPENDENT_AMBULATORY_CARE_PROVIDER_SITE_OTHER): Payer: Medicare Other | Admitting: Gastroenterology

## 2022-02-24 DIAGNOSIS — K5900 Constipation, unspecified: Secondary | ICD-10-CM

## 2022-02-24 DIAGNOSIS — K623 Rectal prolapse: Secondary | ICD-10-CM | POA: Diagnosis not present

## 2022-02-24 NOTE — Progress Notes (Signed)
Gastroenterology Office Note    Referring Provider: Claretta Fraise, MD Primary Care Physician:  Claretta Fraise, MD  Primary GI: Dr. Abbey Chatters    Chief Complaint   Chief Complaint  Patient presents with   New Patient (Initial Visit)    Pt here for chronic constipation     History of Present Illness   Karina Clark is a 76 y.o. female presenting today at the request of Claretta Fraise, MD due to chronic constipation.   Constipation since in her 22s. Tried magnesium pills. Has used Correctol. No improvement with fiber. No improvement with Linzess.   Started Colace 100 mg BID and helped somewhat but not until the third day.  Had unintentially been taking 50 mg BID. About the third day will have a looser BM. Linzess without help. Amitiza was 50$ and no significant improvement with that. States she has had rectal polapse and has had to insert back in. One episode of blood per rectum.    Colonoscopy in 2015 but records not available. States no polyps.   Past Medical History:  Diagnosis Date   Anxiety    Cataract    Depression    GERD (gastroesophageal reflux disease)    Hyperlipidemia    Hypertension    Osteopenia    Substance abuse ([place])    DCed cocaine in 1998    Past Surgical History:  Procedure Laterality Date   EYE SURGERY     NASAL SEPTUM SURGERY     TUBAL LIGATION      Current Outpatient Medications  Medication Sig Dispense Refill   aspirin EC 81 MG tablet Take 81 mg by mouth daily.     atorvastatin (LIPITOR) 20 MG tablet Take 1 tablet (20 mg total) by mouth daily. for cholesterol. 90 tablet 3   diclofenac Sodium (VOLTAREN) 1 % GEL Apply 2 g topically 4 (four) times daily as needed (right shoulder and elbow pain). 350 g 3   ketoconazole (NIZORAL) 2 % cream Apply 1 application topically daily. 60 g 5   Multiple Vitamins-Minerals (EQ VISION FORMULA 50+) CAPS Take 1 capsule by mouth.     Omega-3 1000 MG CAPS Take 3 g by mouth daily.      pantoprazole (PROTONIX)  40 MG tablet TAKE 1 TABLET BY MOUTH ONCE DAILY FOR  STOMACH (NEEDS TO BE SEEN BEFORE NEXT REFILL) 90 tablet 3   venlafaxine XR (EFFEXOR-XR) 75 MG 24 hr capsule Take 1 capsule (75 mg total) by mouth at bedtime. 90 capsule 3   Vitamin D, Cholecalciferol, 1000 UNITS CAPS Take 2,000 Units by mouth daily.      zolpidem (AMBIEN) 5 MG tablet TAKE 1 TABLET BY MOUTH AT BEDTIME 90 tablet 1   No current facility-administered medications for this visit.    Allergies as of 02/24/2022 - Review Complete 02/24/2022  Allergen Reaction Noted   Seroquel [quetiapine]  08/25/2021   Mycelex [clotrimazole] Other (See Comments) 05/08/2015   Naproxen Other (See Comments) 01/14/2015   Sulfamethoxazole-trimethoprim Nausea And Vomiting 01/14/2015    Family History  Problem Relation Age of Onset   Hyperlipidemia Mother    Thyroid disease Mother    Hip fracture Mother    Hypertension Father    Dementia Father    Hypertension Daughter    Cancer Brother        spine   Neuropathy Brother    Breast cancer Neg Hx     Social History   Socioeconomic History   Marital status: Widowed  Spouse name: Not on file   Number of children: 1   Years of education: 78   Highest education level: 12th grade  Occupational History   Occupation: Caregiver    Comment: retired  Tobacco Use   Smoking status: Former    Packs/day: 0.50    Types: Cigarettes    Start date: 03/24/1969    Quit date: 02/05/1995    Years since quitting: 27.0   Smokeless tobacco: Never  Vaping Use   Vaping Use: Never used  Substance and Sexual Activity   Alcohol use: No    Alcohol/week: 0.0 standard drinks of alcohol   Drug use: No   Sexual activity: Not Currently  Other Topics Concern   Not on file  Social History Narrative   Widowed since 1998. She has one adult daughter and 2 grandchildren. She enjoys reading. She lives in a one story home with steps up onto her deck with a handrail.    Her daughter has mental health issues - she stays  worried about her and she is helping care for her, going to her house daily   Social Determinants of Health   Financial Resource Strain: Low Risk  (12/03/2021)   Overall Financial Resource Strain (CARDIA)    Difficulty of Paying Living Expenses: Not hard at all  Food Insecurity: No Food Insecurity (12/03/2021)   Hunger Vital Sign    Worried About Running Out of Food in the Last Year: Never true    [place] in the Last Year: Never true  Transportation Needs: No Transportation Needs (12/03/2021)   PRAPARE - Hydrologist (Medical): No    Lack of Transportation (Non-Medical): No  Physical Activity: Sufficiently Active (12/03/2021)   Exercise Vital Sign    Days of Exercise per Week: 7 days    Minutes of Exercise per Session: 50 min  Stress: No Stress Concern Present (12/03/2021)   [place]    Feeling of Stress : Only a little  Social Connections: Moderately Integrated (12/03/2021)   Social Connection and Isolation Panel [NHANES]    Frequency of Communication with Friends and Family: More than three times a week    Frequency of Social Gatherings with Friends and Family: More than three times a week    Attends Religious Services: More than 4 times per year    Active Member of [employer] or Organizations: Yes    Attends Archivist Meetings: More than 4 times per year    Marital Status: Widowed  Intimate Partner Violence: Not At Risk (12/03/2021)   Humiliation, Afraid, Rape, and Kick questionnaire    Fear of Current or Ex-Partner: No    Emotionally Abused: No    Physically Abused: No    Sexually Abused: No     Review of Systems   Gen: Denies any fever, chills, fatigue, weight loss, lack of appetite.  CV: Denies chest pain, heart palpitations, peripheral edema, syncope.  Resp: Denies shortness of breath at rest or with exertion. Denies wheezing or cough.  GI: see HPI GU : Denies  urinary burning, urinary frequency, urinary hesitancy MS: Denies joint pain, muscle weakness, cramps, or limitation of movement.  Derm: Denies rash, itching, dry skin Psych: Denies depression, anxiety, memory loss, and confusion Heme: Denies bruising, bleeding, and enlarged lymph nodes.   Physical Exam   BP (!) 149/80   Pulse 76   Temp (!) 97.1 F (36.2 C)   Ht  $'4\' 10"'Z$  (1.473 m)   Wt 96 lb 3.2 oz (43.6 kg)   BMI 20.11 kg/m  General:   Alert and oriented. Pleasant and cooperative. Well-nourished and well-developed.  Head:  Normocephalic and atraumatic. Eyes:  Without icterus Ears:  Normal auditory acuity. Lungs:  Clear to auscultation bilaterally.  Heart:  S1, S2 present without murmurs appreciated.  Abdomen:  +BS, soft, non-tender and non-distended. No HSM noted. No guarding or rebound. No masses appreciated.  Rectal:  external hemorrhoid tags, no mass on DRE. No obvious rectal prolapse Msk:  Symmetrical without gross deformities. Normal posture. Extremities:  Without edema. Neurologic:  Alert and  oriented x4;  grossly normal neurologically. Skin:  Intact without significant lesions or rashes. Psych:  Alert and cooperative. Normal mood and affect.   Assessment   Karina Clark is a 76 y.o. female presenting today  at the request of Claretta Fraise, MD due to chronic constipation.   Long-standing constipation noted with failure of multiple OTC agents, Linzess, and Amitiza was too expensive. She has had marginal improvement with Colace 100 mg BID. We will continue this and add Miralax daily. Unfortunately, fiber supplementation has not been helpful either. I did discuss with her that we could try Amitiza once daily at higher dosing, allowing her to spread out the quantity over several months. She would like to hold off on this right now until trying more OTC methods.   Reporting rectal prolapse. Discussed avoidance of straining, addressing constipation, and limiting toilet time.     PLAN   Continue Colace BID  Miralax daily  Call with progress report  3 month return  Annitta Needs, PhD, ANP-BC [facility] Gastroenterology

## 2022-02-24 NOTE — Patient Instructions (Signed)
Continue Colace 100 mg twice a day.  Add Miralax one capful each morning to beverage of your choice.   Call if no improvement in next few weeks.  Avoid straining, limit toilet time to 2-3 minutes.  Please call if bleeding, rectal pain.   It was a pleasure to see you today. I want to create trusting relationships with patients to provide genuine, compassionate, and quality care. I value your feedback. If you receive a survey regarding your visit,  I greatly appreciate you taking time to fill this out.   Annitta Needs, PhD, ANP-BC [facility] Gastroenterology

## 2022-03-02 ENCOUNTER — Ambulatory Visit (INDEPENDENT_AMBULATORY_CARE_PROVIDER_SITE_OTHER): Payer: Medicare Other

## 2022-03-02 DIAGNOSIS — Z23 Encounter for immunization: Secondary | ICD-10-CM

## 2022-03-28 ENCOUNTER — Telehealth: Payer: Self-pay | Admitting: Family Medicine

## 2022-03-30 ENCOUNTER — Encounter: Payer: Self-pay | Admitting: Family Medicine

## 2022-03-30 ENCOUNTER — Ambulatory Visit (INDEPENDENT_AMBULATORY_CARE_PROVIDER_SITE_OTHER): Payer: Medicare Other | Admitting: Family Medicine

## 2022-03-30 VITALS — BP 123/61 | HR 62 | Temp 98.0°F | Ht <= 58 in | Wt 96.4 lb

## 2022-03-30 DIAGNOSIS — M25511 Pain in right shoulder: Secondary | ICD-10-CM

## 2022-03-30 MED ORDER — MELOXICAM 7.5 MG PO TABS: 7.5000 mg | ORAL_TABLET | Freq: Every day | ORAL | 2 refills | Status: DC

## 2022-03-30 NOTE — Progress Notes (Signed)
Subjective:  Patient ID: Karina Clark, female    DOB: [birth year]  Age: 76 y.o. MRN: [id number]  CC: Shoulder Pain (right) and Elbow Pain (Right )   HPI Karina Clark presents for pain at right elbow and shoulder. Some at the wrist also. with use. Has to do her own chores. Present for several weeks without  improvement.     03/30/2022    4:08 PM 02/09/2022    1:38 PM 02/09/2022    1:35 PM  Depression screen PHQ 2/9  Decreased Interest 0 1 0  Down, Depressed, Hopeless 0 1 0  PHQ - 2 Score 0 2 0  Altered sleeping 1 1   Tired, decreased energy 1 1   Change in appetite 0 0   Feeling bad or failure about yourself  0 0   Trouble concentrating 0 0   Moving slowly or fidgety/restless 0 0   Suicidal thoughts 0 0   PHQ-9 Score 2 4   Difficult doing work/chores Not difficult at all Not difficult at all     History Karina Clark has a past medical history of Anxiety, Cataract, Depression, GERD (gastroesophageal reflux disease), Hyperlipidemia, Hypertension, Osteopenia, and Substance abuse ([place]).   She has a past surgical history that includes Tubal ligation; Eye surgery; and Nasal septum surgery.   Her family history includes Cancer in her brother; Dementia in her father; Hip fracture in her mother; Hyperlipidemia in her mother; Hypertension in her daughter and father; Neuropathy in her brother; Thyroid disease in her mother.She reports that she quit smoking about 27 years ago. Her smoking use included cigarettes. She started smoking about 53 years ago. She smoked an average of .5 packs per day. She has never used smokeless tobacco. She reports that she does not drink alcohol and does not use drugs.    ROS Review of Systems noncontibutory Objective:  BP 123/61   Pulse 62   Temp 98 F (36.7 C)   Ht '4\' 10"'$  (1.473 m)   Wt 96 lb 6.4 oz (43.7 kg)   SpO2 97%   BMI 20.15 kg/m   BP Readings from Last 3 Encounters:  03/30/22 123/61  02/24/22 (!) 149/80  02/09/22 128/66    Wt Readings from  Last 3 Encounters:  03/30/22 96 lb 6.4 oz (43.7 kg)  02/24/22 96 lb 3.2 oz (43.6 kg)  02/09/22 97 lb 9.6 oz (44.3 kg)     Physical Exam Constitutional:      General: She is not in acute distress.    Appearance: She is well-developed.  Cardiovascular:     Rate and Rhythm: Normal rate and regular rhythm.  Pulmonary:     Breath sounds: Normal breath sounds.  Musculoskeletal:        General: Tenderness (right shoulder for ROM including abduction, rotation) present. Normal range of motion.  Skin:    General: Skin is warm and dry.  Neurological:     Mental Status: She is alert and oriented to person, place, and time.       Assessment & Plan:   Karina Clark was seen today for shoulder pain and elbow pain.  Diagnoses and all orders for this visit:  Acute pain of right shoulder -     Ambulatory referral to Physical Therapy  Other orders -     meloxicam (MOBIC) 7.5 MG tablet; Take 1 tablet (7.5 mg total) by mouth daily. For joint and muscle pain       I am having Karina Clark. Rulon Abide  Opal Sidles" start on meloxicam. I am also having her maintain her aspirin EC, Vitamin D (Cholecalciferol), Omega-3, EQ Vision Formula 50+, ketoconazole, pantoprazole, venlafaxine XR, zolpidem, diclofenac Sodium, and atorvastatin.  Allergies as of 03/30/2022       Reactions   Seroquel [quetiapine]    Drowsiness the next morning   Mycelex [clotrimazole] Other (See Comments)   Worsened GERD / nausea   Naproxen Other (See Comments)   "Makes my stomach burn"   Sulfamethoxazole-trimethoprim Nausea And Vomiting        Medication List        Accurate as of March 30, 2022 11:59 PM. If you have any questions, ask your nurse or doctor.          aspirin EC 81 MG tablet Take 81 mg by mouth daily.   atorvastatin 20 MG tablet Commonly known as: LIPITOR Take 1 tablet (20 mg total) by mouth daily. for cholesterol.   diclofenac Sodium 1 % Gel Commonly known as: Voltaren Apply 2 g topically 4 (four)  times daily as needed (right shoulder and elbow pain).   EQ Vision Formula 50+ Caps Take 1 capsule by mouth.   ketoconazole 2 % cream Commonly known as: NIZORAL Apply 1 application topically daily.   meloxicam 7.5 MG tablet Commonly known as: MOBIC Take 1 tablet (7.5 mg total) by mouth daily. For joint and muscle pain Started by: Claretta Fraise, MD   Omega-3 1000 MG Caps Take 3 g by mouth daily.   pantoprazole 40 MG tablet Commonly known as: PROTONIX TAKE 1 TABLET BY MOUTH ONCE DAILY FOR  STOMACH (NEEDS TO BE SEEN BEFORE NEXT REFILL)   venlafaxine XR 75 MG 24 hr capsule Commonly known as: EFFEXOR-XR Take 1 capsule (75 mg total) by mouth at bedtime.   Vitamin D (Cholecalciferol) 25 MCG (1000 UT) Caps Take 2,000 Units by mouth daily.   zolpidem 5 MG tablet Commonly known as: AMBIEN TAKE 1 TABLET BY MOUTH AT BEDTIME         Follow-up: Return in about 1 month (around 04/30/2022).  Claretta Fraise, M.D.

## 2022-04-01 ENCOUNTER — Encounter: Payer: Self-pay | Admitting: Family Medicine

## 2022-05-03 ENCOUNTER — Encounter: Payer: Self-pay | Admitting: Family Medicine

## 2022-05-03 ENCOUNTER — Ambulatory Visit (INDEPENDENT_AMBULATORY_CARE_PROVIDER_SITE_OTHER): Payer: Medicare Other | Admitting: Family Medicine

## 2022-05-03 VITALS — BP 155/79 | HR 59 | Temp 98.2°F | Ht <= 58 in | Wt 99.2 lb

## 2022-05-03 DIAGNOSIS — M19021 Primary osteoarthritis, right elbow: Secondary | ICD-10-CM | POA: Diagnosis not present

## 2022-05-03 NOTE — Progress Notes (Unsigned)
Subjective:  Patient ID: Karina Clark, female    DOB: 1945/11/12  Age: 76 y.o. MRN: [id number]  CC: Follow-up (RIGHT SHOULDER PAIN)   HPI Karina Clark presents for Shoulder much better. Never got a call about PT. Med helping. Using diclofenac gel. If she has a breakthrough she adds meloxicam. Saw ortho for the right elbow. She still can not extend beyond 30 degrees, but ortho said it wouldn't help to operate.      05/03/2022    3:16 PM 05/03/2022    2:55 PM 03/30/2022    4:08 PM  Depression screen PHQ 2/9  Decreased Interest 1 0 0  Down, Depressed, Hopeless 1 0 0  PHQ - 2 Score 2 0 0  Altered sleeping 0  1  Tired, decreased energy 1  1  Change in appetite 0  0  Feeling bad or failure about yourself  1  0  Trouble concentrating 0  0  Moving slowly or fidgety/restless 0  0  Suicidal thoughts 0  0  PHQ-9 Score 4  2  Difficult doing work/chores Somewhat difficult  Not difficult at all    History Karina Clark has a past medical history of Anxiety, Cataract, Depression, GERD (gastroesophageal reflux disease), Hyperlipidemia, Hypertension, Osteopenia, and Substance abuse ([place]).   She has a past surgical history that includes Tubal ligation; Eye surgery; and Nasal septum surgery.   Her family history includes Cancer in her brother; Dementia in her father; Hip fracture in her mother; Hyperlipidemia in her mother; Hypertension in her daughter and father; Neuropathy in her brother; Thyroid disease in her mother.She reports that she quit smoking about 27 years ago. Her smoking use included cigarettes. She started smoking about 53 years ago. She smoked an average of .5 packs per day. She has never used smokeless tobacco. She reports that she does not drink alcohol and does not use drugs.    ROS Review of Systems  Objective:  BP (!) 155/79   Pulse (!) 59   Temp 98.2 F (36.8 C)   Ht _0  (1.473 m)   Wt 99 lb 3.2 oz (45 kg)   SpO2 100%   BMI 20.73 kg/m   BP Readings from Last 3  Encounters:  05/03/22 (!) 155/79  03/30/22 123/61  02/24/22 (!) 149/80    Wt Readings from Last 3 Encounters:  05/03/22 99 lb 3.2 oz (45 kg)  03/30/22 96 lb 6.4 oz (43.7 kg)  02/24/22 96 lb 3.2 oz (43.6 kg)     Physical Exam    Assessment & Plan:   Karina Clark was seen today for follow-up.  Diagnoses and all orders for this visit:  Primary osteoarthritis of right elbow -     CMP14+EGFR       I am having Karina Clark. Karina Clark "Karina Clark" maintain her aspirin EC, Vitamin D (Cholecalciferol), Omega-3, EQ Vision Formula 50+, ketoconazole, pantoprazole, venlafaxine XR, zolpidem, diclofenac Sodium, atorvastatin, and meloxicam.  Allergies as of 05/03/2022       Reactions   Seroquel [quetiapine]    Drowsiness the next morning   Mycelex [clotrimazole] Other (See Comments)   Worsened GERD / nausea   Naproxen Other (See Comments)   "Makes my stomach burn"   Sulfamethoxazole-trimethoprim Nausea And Vomiting        Medication List        Accurate as of May 03, 2022  3:42 PM. If you have any questions, ask your nurse or doctor.  aspirin EC 81 MG tablet Take 81 mg by mouth daily.   atorvastatin 20 MG tablet Commonly known as: LIPITOR Take 1 tablet (20 mg total) by mouth daily. for cholesterol.   diclofenac Sodium 1 % Gel Commonly known as: Voltaren Apply 2 g topically 4 (four) times daily as needed (right shoulder and elbow pain).   EQ Vision Formula 50+ Caps Take 1 capsule by mouth.   ketoconazole 2 % cream Commonly known as: NIZORAL Apply 1 application topically daily.   meloxicam 7.5 MG tablet Commonly known as: MOBIC Take 1 tablet (7.5 mg total) by mouth daily. For joint and muscle pain   Omega-3 1000 MG Caps Take 3 g by mouth daily.   pantoprazole 40 MG tablet Commonly known as: PROTONIX TAKE 1 TABLET BY MOUTH ONCE DAILY FOR  STOMACH (NEEDS TO BE SEEN BEFORE NEXT REFILL)   venlafaxine XR 75 MG 24 hr capsule Commonly known as:  EFFEXOR-XR Take 1 capsule (75 mg total) by mouth at bedtime.   Vitamin D (Cholecalciferol) 25 MCG (1000 UT) Caps Take 2,000 Units by mouth daily.   zolpidem 5 MG tablet Commonly known as: AMBIEN TAKE 1 TABLET BY MOUTH AT BEDTIME         Follow-up: No follow-ups on file.  Claretta Fraise, M.D.

## 2022-05-04 ENCOUNTER — Encounter: Payer: Self-pay | Admitting: Family Medicine

## 2022-05-04 LAB — CMP14+EGFR
ALT: 20 IU/L (ref 0–32)
AST: 26 IU/L (ref 0–40)
Albumin/Globulin Ratio: 2.2 (ref 1.2–2.2)
Albumin: 4.3 g/dL (ref 3.8–4.8)
Alkaline Phosphatase: 78 IU/L (ref 44–121)
BUN/Creatinine Ratio: 17 (ref 12–28)
BUN: 15 mg/dL (ref 8–27)
Bilirubin Total: 0.3 mg/dL (ref 0.0–1.2)
CO2: 25 mmol/L (ref 20–29)
Calcium: 9.5 mg/dL (ref 8.7–10.3)
Chloride: 102 mmol/L (ref 96–106)
Creatinine, Ser: 0.88 mg/dL (ref 0.57–1.00)
Globulin, Total: 2 g/dL (ref 1.5–4.5)
Glucose: 90 mg/dL (ref 70–99)
Potassium: 4.3 mmol/L (ref 3.5–5.2)
Sodium: 140 mmol/L (ref 134–144)
Total Protein: 6.3 g/dL (ref 6.0–8.5)
eGFR: 68 mL/min/{1.73_m2} (ref >59)

## 2022-05-08 NOTE — Progress Notes (Signed)
Hello  Annita,    Your lab result is normal and/or stable.Some minor variations that are not significant are commonly marked abnormal, but do not represent any medical problem for you.   Best regards,  Crysten Kaman, M.D.

## 2022-05-30 ENCOUNTER — Other Ambulatory Visit: Payer: Self-pay | Admitting: Family Medicine

## 2022-05-30 DIAGNOSIS — Z79899 Other long term (current) drug therapy: Secondary | ICD-10-CM

## 2022-05-30 DIAGNOSIS — F5101 Primary insomnia: Secondary | ICD-10-CM

## 2022-05-31 NOTE — Telephone Encounter (Signed)
Controlled substance so can't be filled outside of office visit. Please contact pt to schedule appt.

## 2022-06-02 NOTE — Telephone Encounter (Signed)
Patient had her last regular check up with PCP on 02/09/22 but no refills were sent for her Ambien at that time because she still had medicine. Pt is now out and needs refills sent in. Per Dr Livia Snellen OV notes made at visit on 02/09/22, pt doesn't need to come back until around 08/10/2022 so she is not due for an appt. Can covering provider send refill in since Dr Livia Snellen is on vacation?

## 2022-06-08 ENCOUNTER — Ambulatory Visit: Payer: Medicare Other | Admitting: Gastroenterology

## 2022-07-20 ENCOUNTER — Other Ambulatory Visit: Payer: Self-pay | Admitting: Family Medicine

## 2022-07-20 DIAGNOSIS — K219 Gastro-esophageal reflux disease without esophagitis: Secondary | ICD-10-CM

## 2022-08-10 ENCOUNTER — Ambulatory Visit (INDEPENDENT_AMBULATORY_CARE_PROVIDER_SITE_OTHER): Payer: Medicare Other | Admitting: Family Medicine

## 2022-08-10 ENCOUNTER — Encounter: Payer: Self-pay | Admitting: Family Medicine

## 2022-08-10 VITALS — BP 153/75 | HR 72 | Temp 97.7°F | Ht <= 58 in | Wt 97.8 lb

## 2022-08-10 DIAGNOSIS — R42 Dizziness and giddiness: Secondary | ICD-10-CM | POA: Diagnosis not present

## 2022-08-10 DIAGNOSIS — I10 Essential (primary) hypertension: Secondary | ICD-10-CM

## 2022-08-10 DIAGNOSIS — F5101 Primary insomnia: Secondary | ICD-10-CM

## 2022-08-10 DIAGNOSIS — Z79899 Other long term (current) drug therapy: Secondary | ICD-10-CM

## 2022-08-10 DIAGNOSIS — E559 Vitamin D deficiency, unspecified: Secondary | ICD-10-CM | POA: Diagnosis not present

## 2022-08-10 DIAGNOSIS — E782 Mixed hyperlipidemia: Secondary | ICD-10-CM

## 2022-08-10 DIAGNOSIS — R2689 Other abnormalities of gait and mobility: Secondary | ICD-10-CM

## 2022-08-10 DIAGNOSIS — G459 Transient cerebral ischemic attack, unspecified: Secondary | ICD-10-CM | POA: Diagnosis not present

## 2022-08-10 MED ORDER — ZOLPIDEM TARTRATE 5 MG PO TABS: 5.0000 mg | ORAL_TABLET | Freq: Every day | ORAL | 5 refills | Status: DC

## 2022-08-10 MED ORDER — KETOCONAZOLE 2 % EX CREA: 1.0000 | TOPICAL_CREAM | Freq: Every day | CUTANEOUS | 5 refills | Status: DC

## 2022-08-10 NOTE — Progress Notes (Signed)
Subjective:  Patient ID: Karina Clark, female    DOB: 03/15/1946  Age: 77 y.o. MRN: [id number]  CC: Medical Management of Chronic Issues   HPI Karina Clark presents for  presents for  follow-up of hypertension. Patient has no history of headache chest pain or shortness of breath or recent cough. Patient also denies symptoms of TIA such as focal numbness or weakness. Patient denies side effects from medication. States taking it regularly.   in for follow-up of elevated cholesterol. Doing well without complaints on current medication. Denies side effects of statin including myalgia and arthralgia and nausea. Currently no chest pain, shortness of breath or other cardiovascular related symptoms noted.   Memory isn't good. Forgets then panics.   Gets dizzy, off balance. Concerned for falling. Feet stop and Karina Clark body keeps going. Three discreet episodes in the last three weeks. Last 1-2 hours. Has had right arm paresis during each.      08/10/2022    1:18 PM 08/10/2022    1:10 PM 05/03/2022    3:16 PM  Depression screen PHQ 2/9  Decreased Interest 1 0 1  Down, Depressed, Hopeless 1 0 1  PHQ - 2 Score 2 0 2  Altered sleeping 0  0  Tired, decreased energy 2  1  Change in appetite 3  0  Feeling bad or failure about yourself  1  1  Trouble concentrating 0  0  Moving slowly or fidgety/restless 0  0  Suicidal thoughts 0  0  PHQ-9 Score 8  4  Difficult doing work/chores Somewhat difficult  Somewhat difficult    History Karina Clark has a past medical history of Anxiety, Cataract, Depression, GERD (gastroesophageal reflux disease), Hyperlipidemia, Hypertension, Osteopenia, and Substance abuse ([place]).   Karina Clark has a past surgical history that includes Tubal ligation; Eye surgery; and Nasal septum surgery.   Karina Clark family history includes Cancer in Karina Clark brother; Dementia in Karina Clark father; Hip fracture in Karina Clark mother; Hyperlipidemia in Karina Clark mother; Hypertension in Karina Clark daughter and father; Neuropathy in Karina Clark  brother; Thyroid disease in Karina Clark mother.Karina Clark reports that Karina Clark quit smoking about 27 years ago. Karina Clark smoking use included cigarettes. Karina Clark started smoking about 53 years ago. Karina Clark smoked an average of .5 packs per day. Karina Clark has never used smokeless tobacco. Karina Clark reports that Karina Clark does not drink alcohol and does not use drugs.    ROS Review of Systems  Constitutional: Negative.   HENT: Negative.  Negative for congestion.   Eyes:  Negative for visual disturbance.  Respiratory:  Negative for shortness of breath.   Cardiovascular:  Negative for chest pain.  Gastrointestinal:  Negative for abdominal pain, constipation, diarrhea, nausea and vomiting.  Genitourinary:  Negative for difficulty urinating.  Musculoskeletal:  Negative for arthralgias and myalgias.  Neurological:  Positive for weakness. Negative for headaches.  Psychiatric/Behavioral:  Negative for sleep disturbance. The patient is nervous/anxious.     Objective:  BP (!) 153/75   Pulse 72   Temp 97.7 F (36.5 C)   Ht '4\' 10"'$  (1.473 m)   Wt 97 lb 12.8 oz (44.4 kg)   SpO2 97%   BMI 20.44 kg/m   BP Readings from Last 3 Encounters:  08/10/22 (!) 153/75  05/03/22 (!) 155/79  03/30/22 123/61    Wt Readings from Last 3 Encounters:  08/10/22 97 lb 12.8 oz (44.4 kg)  05/03/22 99 lb 3.2 oz (45 kg)  03/30/22 96 lb 6.4 oz (43.7 kg)     Physical Exam Constitutional:  General: Karina Clark is not in acute distress.    Appearance: Karina Clark is well-developed.  HENT:     Head: Normocephalic and atraumatic.  Eyes:     Conjunctiva/sclera: Conjunctivae normal.     Pupils: Pupils are equal, round, and reactive to light.  Neck:     Thyroid: No thyromegaly.  Cardiovascular:     Rate and Rhythm: Normal rate and regular rhythm.     Heart sounds: Normal heart sounds. No murmur heard. Pulmonary:     Effort: Pulmonary effort is normal. No respiratory distress.     Breath sounds: Normal breath sounds. No wheezing or rales.  Abdominal:     General:  Bowel sounds are normal. There is no distension.     Palpations: Abdomen is soft.     Tenderness: There is no abdominal tenderness.  Musculoskeletal:        General: Normal range of motion.     Cervical back: Normal range of motion and neck supple.  Lymphadenopathy:     Cervical: No cervical adenopathy.  Skin:    General: Skin is warm and dry.  Neurological:     Mental Status: Karina Clark is alert and oriented to person, place, and time.  Psychiatric:        Behavior: Behavior normal.        Thought Content: Thought content normal.        Judgment: Judgment normal.       Assessment & Plan:   Karina Clark was seen today for medical management of chronic issues.  Diagnoses and all orders for this visit:  TIA (transient ischemic attack) -     [place]; Future  Mixed hyperlipidemia -     CBC with Differential/Platelet -     CMP14+EGFR -     Lipid panel  Primary insomnia -     zolpidem (AMBIEN) 5 MG tablet; Take 1 tablet (5 mg total) by mouth at bedtime.  Long term prescription benzodiazepine use -     zolpidem (AMBIEN) 5 MG tablet; Take 1 tablet (5 mg total) by mouth at bedtime.  Primary hypertension  Impairment of balance -     TSH -     Sedimentation rate -     Vitamin B12 -     MR BRAIN W WO CONTRAST; Future  Vitamin D deficiency -     VITAMIN D 25 Hydroxy (Vit-D Deficiency, Fractures)  Dizziness -     MR BRAIN W WO CONTRAST; Future  Other orders -     Discontinue: ketoconazole (NIZORAL) 2 % cream; Apply 1 Application topically daily. -     ketoconazole (NIZORAL) 2 % cream; Apply 1 Application topically daily.       I have changed [name]'s zolpidem. I am also having Karina Clark maintain Karina Clark aspirin EC, Vitamin D (Cholecalciferol), Omega-3, EQ Vision Formula 50+, venlafaxine XR, diclofenac Sodium, atorvastatin, meloxicam, pantoprazole, and ketoconazole.  Allergies as of 08/10/2022       Reactions   Seroquel [quetiapine]    Drowsiness the  next morning   Mycelex [clotrimazole] Other (See Comments)   Worsened GERD / nausea   Naproxen Other (See Comments)   "Makes my stomach burn"   Sulfamethoxazole-trimethoprim Nausea And Vomiting        Medication List        Accurate as of August 10, 2022 10:09 PM. If you have any questions, ask your nurse or doctor.          aspirin  EC 81 MG tablet Take 81 mg by mouth daily.   atorvastatin 20 MG tablet Commonly known as: LIPITOR Take 1 tablet (20 mg total) by mouth daily. for cholesterol.   diclofenac Sodium 1 % Gel Commonly known as: Voltaren Apply 2 g topically 4 (four) times daily as needed (right shoulder and elbow pain).   EQ Vision Formula 50+ Caps Take 1 capsule by mouth.   ketoconazole 2 % cream Commonly known as: NIZORAL Apply 1 Application topically daily.   meloxicam 7.5 MG tablet Commonly known as: MOBIC Take 1 tablet (7.5 mg total) by mouth daily. For joint and muscle pain   Omega-3 1000 MG Caps Take 3 g by mouth daily.   pantoprazole 40 MG tablet Commonly known as: PROTONIX TAKE 1 TABLET BY MOUTH ONCE DAILY FOR  STOMACH   venlafaxine XR 75 MG 24 hr capsule Commonly known as: EFFEXOR-XR Take 1 capsule (75 mg total) by mouth at bedtime.   Vitamin D (Cholecalciferol) 25 MCG (1000 UT) Caps Take 2,000 Units by mouth daily.   zolpidem 5 MG tablet Commonly known as: AMBIEN Take 1 tablet (5 mg total) by mouth at bedtime.         Follow-up: Return in about 2 weeks (around 08/24/2022).  Claretta Fraise, M.D.

## 2022-08-11 LAB — LIPID PANEL
Chol/HDL Ratio: 2.4 ratio (ref 0.0–4.4)
Cholesterol, Total: 191 mg/dL (ref 100–199)
HDL: 78 mg/dL (ref >39)
LDL Chol Calc (NIH): 100 mg/dL — ABNORMAL HIGH (ref 0–99)
Triglycerides: 72 mg/dL (ref 0–149)
VLDL Cholesterol Cal: 13 mg/dL (ref 5–40)

## 2022-08-11 LAB — CMP14+EGFR
ALT: 29 IU/L (ref 0–32)
AST: 28 IU/L (ref 0–40)
Albumin/Globulin Ratio: 2.3 — ABNORMAL HIGH (ref 1.2–2.2)
Albumin: 4.5 g/dL (ref 3.8–4.8)
Alkaline Phosphatase: 78 IU/L (ref 44–121)
BUN/Creatinine Ratio: 22 (ref 12–28)
BUN: 19 mg/dL (ref 8–27)
Bilirubin Total: 0.3 mg/dL (ref 0.0–1.2)
CO2: 23 mmol/L (ref 20–29)
Calcium: 9.2 mg/dL (ref 8.7–10.3)
Chloride: 103 mmol/L (ref 96–106)
Creatinine, Ser: 0.86 mg/dL (ref 0.57–1.00)
Globulin, Total: 2 g/dL (ref 1.5–4.5)
Glucose: 94 mg/dL (ref 70–99)
Potassium: 4.3 mmol/L (ref 3.5–5.2)
Sodium: 142 mmol/L (ref 134–144)
Total Protein: 6.5 g/dL (ref 6.0–8.5)
eGFR: 70 mL/min/{1.73_m2} (ref >59)

## 2022-08-11 LAB — CBC WITH DIFFERENTIAL/PLATELET
Basophils Absolute: 0 10*3/uL (ref 0.0–0.2)
Basos: 0 %
EOS (ABSOLUTE): 0.1 10*3/uL (ref 0.0–0.4)
Eos: 1 %
Hematocrit: 32.9 % — ABNORMAL LOW (ref 34.0–46.6)
Hemoglobin: 11.2 g/dL (ref 11.1–15.9)
Immature Grans (Abs): 0 10*3/uL (ref 0.0–0.1)
Immature Granulocytes: 0 %
Lymphocytes Absolute: 1.8 10*3/uL (ref 0.7–3.1)
Lymphs: 36 %
MCH: 31.5 pg (ref 26.6–33.0)
MCHC: 34 g/dL (ref 31.5–35.7)
MCV: 92 fL (ref 79–97)
Monocytes Absolute: 0.5 10*3/uL (ref 0.1–0.9)
Monocytes: 9 %
Neutrophils Absolute: 2.6 10*3/uL (ref 1.4–7.0)
Neutrophils: 54 %
Platelets: 201 10*3/uL (ref 150–450)
RBC: 3.56 x10E6/uL — ABNORMAL LOW (ref 3.77–5.28)
RDW: 12 % (ref 11.7–15.4)
WBC: 4.9 10*3/uL (ref 3.4–10.8)

## 2022-08-11 LAB — VITAMIN D 25 HYDROXY (VIT D DEFICIENCY, FRACTURES): Vit D, 25-Hydroxy: 65.5 ng/mL (ref 30.0–100.0)

## 2022-08-11 LAB — VITAMIN B12: Vitamin B-12: 386 pg/mL (ref 232–1245)

## 2022-08-11 LAB — SEDIMENTATION RATE: Sed Rate: 2 mm/hr (ref 0–40)

## 2022-08-11 LAB — TSH: TSH: 1.69 u[IU]/mL (ref 0.450–4.500)

## 2022-08-11 NOTE — Progress Notes (Signed)
Hello Latima,  Your lab result is normal and/or stable.Some minor variations that are not significant are commonly marked abnormal, but do not represent any medical problem for you.  Best regards, Claretta Fraise, M.D.

## 2022-08-24 ENCOUNTER — Other Ambulatory Visit: Payer: Self-pay | Admitting: Family Medicine

## 2022-08-24 DIAGNOSIS — Z1231 Encounter for screening mammogram for malignant neoplasm of breast: Secondary | ICD-10-CM

## 2022-08-30 ENCOUNTER — Ambulatory Visit (INDEPENDENT_AMBULATORY_CARE_PROVIDER_SITE_OTHER): Payer: Medicare Other | Admitting: Family Medicine

## 2022-08-30 ENCOUNTER — Encounter: Payer: Self-pay | Admitting: Family Medicine

## 2022-08-30 VITALS — BP 160/82 | HR 63 | Temp 97.7°F | Ht <= 58 in | Wt 98.6 lb

## 2022-08-30 DIAGNOSIS — M25521 Pain in right elbow: Secondary | ICD-10-CM

## 2022-08-30 DIAGNOSIS — F419 Anxiety disorder, unspecified: Secondary | ICD-10-CM

## 2022-08-30 MED ORDER — ATORVASTATIN CALCIUM 20 MG PO TABS: 20.0000 mg | ORAL_TABLET | Freq: Every day | ORAL | 3 refills | Status: DC

## 2022-08-30 MED ORDER — VENLAFAXINE HCL ER 150 MG PO CP24: 150.0000 mg | ORAL_CAPSULE | Freq: Every day | ORAL | 1 refills | Status: DC

## 2022-08-30 MED ORDER — DICLOFENAC SODIUM 1 % EX GEL: 2.0000 g | Freq: Four times a day (QID) | CUTANEOUS | 3 refills | Status: DC | PRN

## 2022-08-30 NOTE — Progress Notes (Signed)
Subjective:  Patient ID: Karina Clark, female    DOB: [birth year]  Age: 77 y.o. MRN: [id number]  CC: Follow-up (TIA)   HPI Karina Clark presents for recheck of the recent spells of dizziness with right arem feeling weak. Getting more forgetful. Still anxious. GAD noted to show improvement, but pt. Places anxiousness at nearly daily.      08/30/2022    1:04 PM 08/10/2022    1:19 PM 05/03/2022    3:16 PM 03/30/2022    4:09 PM  GAD 7 : Generalized Anxiety Score  Nervous, Anxious, on Edge 1 2 1 1   Control/stop worrying 1 3 1  0  Worry too much - different things 1 3 1 1   Trouble relaxing 1 2 1  0  Restless 1 2 0 0  Easily annoyed or irritable 0 1 0 0  Afraid - awful might happen 1 2 1 1   Total GAD 7 Score 6 15 5 3   Anxiety Difficulty Somewhat difficult Somewhat difficult Somewhat difficult Somewhat difficult        08/30/2022    1:03 PM 08/30/2022   12:54 PM 08/10/2022    1:18 PM  Depression screen PHQ 2/9  Decreased Interest 2 0 1  Down, Depressed, Hopeless 2 0 1  PHQ - 2 Score 4 0 2  Altered sleeping 0  0  Tired, decreased energy 1  2  Change in appetite 1  3  Feeling bad or failure about yourself  1  1  Trouble concentrating 1  0  Moving slowly or fidgety/restless 0  0  Suicidal thoughts 0  0  PHQ-9 Score 8  8  Difficult doing work/chores Somewhat difficult  Somewhat difficult    History Karina Clark has a past medical history of Anxiety, Cataract, Depression, GERD (gastroesophageal reflux disease), Hyperlipidemia, Hypertension, Osteopenia, and Substance abuse ([place]).   She has a past surgical history that includes Tubal ligation; Eye surgery; and Nasal septum surgery.   Her family history includes Cancer in her brother; Dementia in her father; Hip fracture in her mother; Hyperlipidemia in her mother; Hypertension in her daughter and father; Neuropathy in her brother; Thyroid disease in her mother.She reports that she quit smoking about 27 years ago. Her smoking use included  cigarettes. She started smoking about 53 years ago. She smoked an average of .5 packs per day. She has never used smokeless tobacco. She reports that she does not drink alcohol and does not use drugs.    ROS Review of Systems  Constitutional: Negative.   HENT: Negative.    Eyes:  Negative for visual disturbance.  Respiratory:  Negative for shortness of breath.   Cardiovascular:  Negative for chest pain.  Gastrointestinal:  Negative for abdominal pain.  Musculoskeletal:  Negative for arthralgias.    Objective:  BP (!) 160/82   Pulse 63   Temp 97.7 F (36.5 C)   Ht 4\' 10"  (1.473 m)   Wt 98 lb 9.6 oz (44.7 kg)   SpO2 99%   BMI 20.61 kg/m   BP Readings from Last 3 Encounters:  08/30/22 (!) 160/82  08/10/22 (!) 153/75  05/03/22 (!) 155/79    Wt Readings from Last 3 Encounters:  08/30/22 98 lb 9.6 oz (44.7 kg)  08/10/22 97 lb 12.8 oz (44.4 kg)  05/03/22 99 lb 3.2 oz (45 kg)     Physical Exam Constitutional:      General: She is not in acute distress.    Appearance: She is well-developed.  Cardiovascular:  Rate and Rhythm: Normal rate and regular rhythm.  Pulmonary:     Breath sounds: Normal breath sounds.  Musculoskeletal:        General: Normal range of motion.  Skin:    General: Skin is warm and dry.  Neurological:     Mental Status: She is alert and oriented to person, place, and time.       Assessment & Plan:   Karina Clark was seen today for follow-up.  Diagnoses and all orders for this visit:  Anxiety  Right elbow pain -     diclofenac Sodium (VOLTAREN) 1 % GEL; Apply 2 g topically 4 (four) times daily as needed (right shoulder and elbow pain).  Other orders -     venlafaxine XR (EFFEXOR-XR) 150 MG 24 hr capsule; Take 1 capsule (150 mg total) by mouth at bedtime. -     atorvastatin (LIPITOR) 20 MG tablet; Take 1 tablet (20 mg total) by mouth daily. for cholesterol.       I have changed Karina Lick. Woolman "Thi Jane"'s venlafaxine XR. I am also  having her maintain her aspirin EC, Vitamin D (Cholecalciferol), Omega-3, EQ Vision Formula 50+, meloxicam, pantoprazole, zolpidem, ketoconazole, diclofenac Sodium, and atorvastatin.  Allergies as of 08/30/2022       Reactions   Seroquel [quetiapine]    Drowsiness the next morning   Mycelex [clotrimazole] Other (See Comments)   Worsened GERD / nausea   Naproxen Other (See Comments)   "Makes my stomach burn"   Sulfamethoxazole-trimethoprim Nausea And Vomiting        Medication List        Accurate as of August 30, 2022  8:19 PM. If you have any questions, ask your nurse or doctor.          aspirin EC 81 MG tablet Take 81 mg by mouth daily.   atorvastatin 20 MG tablet Commonly known as: LIPITOR Take 1 tablet (20 mg total) by mouth daily. for cholesterol.   diclofenac Sodium 1 % Gel Commonly known as: Voltaren Apply 2 g topically 4 (four) times daily as needed (right shoulder and elbow pain).   EQ Vision Formula 50+ Caps Take 1 capsule by mouth.   ketoconazole 2 % cream Commonly known as: NIZORAL Apply 1 Application topically daily.   meloxicam 7.5 MG tablet Commonly known as: MOBIC Take 1 tablet (7.5 mg total) by mouth daily. For joint and muscle pain   Omega-3 1000 MG Caps Take 3 g by mouth daily.   pantoprazole 40 MG tablet Commonly known as: PROTONIX TAKE 1 TABLET BY MOUTH ONCE DAILY FOR  STOMACH   venlafaxine XR 150 MG 24 hr capsule Commonly known as: EFFEXOR-XR Take 1 capsule (150 mg total) by mouth at bedtime. What changed:  medication strength how much to take Changed by: Claretta Fraise, MD   Vitamin D (Cholecalciferol) 25 MCG (1000 UT) Caps Take 2,000 Units by mouth daily.   zolpidem 5 MG tablet Commonly known as: AMBIEN Take 1 tablet (5 mg total) by mouth at bedtime.         Follow-up: Return in about 3 months (around 11/30/2022).  Claretta Fraise, M.D.

## 2022-09-06 ENCOUNTER — Ambulatory Visit (HOSPITAL_COMMUNITY)
Admission: RE | Admit: 2022-09-06 | Discharge: 2022-09-06 | Disposition: A | Discharge status: Home / Self Care | Payer: Medicare Other | Source: Ambulatory Visit | Attending: Family Medicine | Admitting: Family Medicine

## 2022-09-06 DIAGNOSIS — G459 Transient cerebral ischemic attack, unspecified: Secondary | ICD-10-CM | POA: Diagnosis not present

## 2022-09-06 DIAGNOSIS — R42 Dizziness and giddiness: Secondary | ICD-10-CM

## 2022-09-06 DIAGNOSIS — R2689 Other abnormalities of gait and mobility: Secondary | ICD-10-CM | POA: Insufficient documentation

## 2022-09-06 DIAGNOSIS — G319 Degenerative disease of nervous system, unspecified: Secondary | ICD-10-CM | POA: Diagnosis not present

## 2022-09-06 MED ORDER — GADOBUTROL 1 MMOL/ML IV SOLN
5.0000 mL | Freq: Once | INTRAVENOUS | Status: AC | PRN
  Administered 2022-09-06: 5 mL via INTRAVENOUS

## 2022-10-03 ENCOUNTER — Ambulatory Visit: Payer: Medicare Other | Admitting: Family Medicine

## 2022-10-14 ENCOUNTER — Other Ambulatory Visit: Payer: Self-pay | Admitting: Family Medicine

## 2022-10-14 DIAGNOSIS — K219 Gastro-esophageal reflux disease without esophagitis: Secondary | ICD-10-CM

## 2022-10-17 ENCOUNTER — Ambulatory Visit
Admission: RE | Admit: 2022-10-17 | Discharge: 2022-10-17 | Disposition: A | Discharge status: Home / Self Care | Payer: Medicare Other | Source: Ambulatory Visit | Attending: Family Medicine | Admitting: Family Medicine

## 2022-10-17 DIAGNOSIS — Z1231 Encounter for screening mammogram for malignant neoplasm of breast: Secondary | ICD-10-CM

## 2022-11-23 DIAGNOSIS — D485 Neoplasm of uncertain behavior of skin: Secondary | ICD-10-CM | POA: Diagnosis not present

## 2022-11-23 DIAGNOSIS — L570 Actinic keratosis: Secondary | ICD-10-CM | POA: Diagnosis not present

## 2022-11-28 ENCOUNTER — Other Ambulatory Visit: Payer: Self-pay | Admitting: Family Medicine

## 2022-11-28 DIAGNOSIS — Z780 Asymptomatic menopausal state: Secondary | ICD-10-CM

## 2022-11-30 ENCOUNTER — Ambulatory Visit (INDEPENDENT_AMBULATORY_CARE_PROVIDER_SITE_OTHER): Payer: Medicare Other

## 2022-11-30 ENCOUNTER — Ambulatory Visit (INDEPENDENT_AMBULATORY_CARE_PROVIDER_SITE_OTHER): Payer: Medicare Other | Admitting: Family Medicine

## 2022-11-30 ENCOUNTER — Encounter: Payer: Self-pay | Admitting: Family Medicine

## 2022-11-30 VITALS — BP 151/73 | HR 68 | Temp 98.0°F | Ht <= 58 in | Wt 94.4 lb

## 2022-11-30 DIAGNOSIS — K130 Diseases of lips: Secondary | ICD-10-CM | POA: Diagnosis not present

## 2022-11-30 DIAGNOSIS — M419 Scoliosis, unspecified: Secondary | ICD-10-CM | POA: Diagnosis not present

## 2022-11-30 DIAGNOSIS — M549 Dorsalgia, unspecified: Secondary | ICD-10-CM | POA: Diagnosis not present

## 2022-11-30 DIAGNOSIS — K219 Gastro-esophageal reflux disease without esophagitis: Secondary | ICD-10-CM

## 2022-11-30 DIAGNOSIS — I10 Essential (primary) hypertension: Secondary | ICD-10-CM | POA: Diagnosis not present

## 2022-11-30 DIAGNOSIS — M47814 Spondylosis without myelopathy or radiculopathy, thoracic region: Secondary | ICD-10-CM | POA: Diagnosis not present

## 2022-11-30 DIAGNOSIS — Z780 Asymptomatic menopausal state: Secondary | ICD-10-CM

## 2022-11-30 DIAGNOSIS — E782 Mixed hyperlipidemia: Secondary | ICD-10-CM | POA: Diagnosis not present

## 2022-11-30 DIAGNOSIS — M5134 Other intervertebral disc degeneration, thoracic region: Secondary | ICD-10-CM | POA: Diagnosis not present

## 2022-11-30 DIAGNOSIS — M438X4 Other specified deforming dorsopathies, thoracic region: Secondary | ICD-10-CM | POA: Diagnosis not present

## 2022-11-30 MED ORDER — ZINC GLUCONATE 50 MG PO TABS
50.0000 mg | ORAL_TABLET | Freq: Every day | ORAL | Status: DC
Start: 2022-11-30 — End: 2024-04-03

## 2022-11-30 MED ORDER — OLMESARTAN MEDOXOMIL 20 MG PO TABS
20.0000 mg | ORAL_TABLET | Freq: Every day | ORAL | 1 refills | Status: DC
Start: 2022-11-30 — End: 2023-06-01

## 2022-11-30 MED ORDER — PANTOPRAZOLE SODIUM 40 MG PO TBEC
DELAYED_RELEASE_TABLET | ORAL | 3 refills | Status: DC
Start: 2022-11-30 — End: 2024-02-12

## 2022-11-30 MED ORDER — MELOXICAM 7.5 MG PO TABS: 7.5000 mg | ORAL_TABLET | Freq: Every day | ORAL | 2 refills | Status: DC

## 2022-11-30 NOTE — Progress Notes (Signed)
Subjective:  Patient ID: Karina Clark, female    DOB: 1946/03/15  Age: 77 y.o. MRN: [id number]  CC: Medical Management of Chronic Issues   HPI MIKEALA CARLOCK presents for follow-up of elevated cholesterol. Doing well without complaints on current medication. Denies side effects of statin including myalgia and arthralgia and nausea. Also in today for liver function testing. Currently no chest pain, shortness of breath or other cardiovascular related symptoms noted.  Patient in for follow-up of GERD. Currently asymptomatic taking  PPI daily. There is no chest pain or heartburn. No hematemesis and no melena. No dysphagia or choking. Onset is remote. Progression is stable. Complicating factors, none.     11/30/2022    1:01 PM 08/30/2022    1:03 PM 08/30/2022   12:54 PM 08/10/2022    1:18 PM 08/10/2022    1:10 PM  Depression screen PHQ 2/9  Decreased Interest 1 2 0 1 0  Down, Depressed, Hopeless 1 2 0 1 0  PHQ - 2 Score 2 4 0 2 0  Altered sleeping 0 0  0   Tired, decreased energy 1 1  2    Change in appetite 1 1  3    Feeling bad or failure about yourself  1 1  1    Trouble concentrating 0 1  0   Moving slowly or fidgety/restless 0 0  0   Suicidal thoughts 0 0  0   PHQ-9 Score 5 8  8    Difficult doing work/chores Somewhat difficult Somewhat difficult  Somewhat difficult        11/30/2022    1:01 PM 08/30/2022    1:04 PM 08/10/2022    1:19 PM 05/03/2022    3:16 PM  GAD 7 : Generalized Anxiety Score  Nervous, Anxious, on Edge 1 1 2 1   Control/stop worrying 1 1 3 1   Worry too much - different things 1 1 3 1   Trouble relaxing 1 1 2 1   Restless 0 1 2 0  Easily annoyed or irritable 0 0 1 0  Afraid - awful might happen 0 1 2 1   Total GAD 7 Score 4 6 15 5   Anxiety Difficulty Somewhat difficult Somewhat difficult Somewhat difficult Somewhat difficult     History Elba has a past medical history of Anxiety, Cataract, Depression, GERD (gastroesophageal reflux disease), Hyperlipidemia,  Hypertension, Osteopenia, and Substance abuse ([facility]).   She has a past surgical history that includes Tubal ligation; Eye surgery; and Nasal septum surgery.   Her family history includes Cancer in her brother; Dementia in her father; Hip fracture in her mother; Hyperlipidemia in her mother; Hypertension in her daughter and father; Neuropathy in her brother; Thyroid disease in her mother.She reports that she quit smoking about 27 years ago. Her smoking use included cigarettes. She started smoking about 53 years ago. She smoked an average of .5 packs per day. She has never used smokeless tobacco. She reports that she does not drink alcohol and does not use drugs.  Current Outpatient Medications on File Prior to Visit  Medication Sig Dispense Refill   aspirin EC 81 MG tablet Take 81 mg by mouth daily.     atorvastatin (LIPITOR) 20 MG tablet Take 1 tablet (20 mg total) by mouth daily. for cholesterol. 90 tablet 3   diclofenac Sodium (VOLTAREN) 1 % GEL Apply 2 g topically 4 (four) times daily as needed (right shoulder and elbow pain). 350 g 3   ketoconazole (NIZORAL) 2 % cream Apply 1 Application topically  daily. 60 g 5   Multiple Vitamins-Minerals (EQ VISION FORMULA 50+) CAPS Take 1 capsule by mouth.     Omega-3 1000 MG CAPS Take 3 g by mouth daily.      venlafaxine XR (EFFEXOR-XR) 150 MG 24 hr capsule Take 1 capsule (150 mg total) by mouth at bedtime. 90 capsule 1   Vitamin D, Cholecalciferol, 1000 UNITS CAPS Take 2,000 Units by mouth daily.      zolpidem (AMBIEN) 5 MG tablet Take 1 tablet (5 mg total) by mouth at bedtime. 30 tablet 5   No current facility-administered medications on file prior to visit.    ROS Review of Systems  Constitutional: Negative.   HENT: Negative.    Eyes:  Negative for visual disturbance.  Respiratory:  Negative for shortness of breath.   Cardiovascular:  Negative for chest pain.  Gastrointestinal:  Negative for abdominal pain.  Musculoskeletal:  Negative for  arthralgias.    Objective:  BP (!) 151/73   Pulse 68   Temp 98 F (36.7 C)   Ht 4\' 10"  (1.473 m)   Wt 94 lb 6.4 oz (42.8 kg)   BMI 19.73 kg/m   BP Readings from Last 3 Encounters:  11/30/22 (!) 151/73  08/30/22 (!) 160/82  08/10/22 (!) 153/75    Wt Readings from Last 3 Encounters:  11/30/22 94 lb 6.4 oz (42.8 kg)  08/30/22 98 lb 9.6 oz (44.7 kg)  08/10/22 97 lb 12.8 oz (44.4 kg)     Physical Exam Constitutional:      General: She is not in acute distress.    Appearance: She is well-developed.  Cardiovascular:     Rate and Rhythm: Normal rate and regular rhythm.  Pulmonary:     Breath sounds: Normal breath sounds.  Musculoskeletal:        General: Normal range of motion.  Skin:    General: Skin is warm and dry.  Neurological:     Mental Status: She is alert and oriented to person, place, and time.     No results found for: "HGBA1C"  Lab Results  Component Value Date   WBC 4.3 11/30/2022   HGB 11.3 11/30/2022   HCT 33.4 (L) 11/30/2022   PLT 215 11/30/2022   GLUCOSE 90 11/30/2022   CHOL 192 11/30/2022   TRIG 119 11/30/2022   HDL 77 11/30/2022   LDLCALC 94 11/30/2022   ALT 14 11/30/2022   AST 25 11/30/2022   NA 141 11/30/2022   K 4.3 11/30/2022   CL 101 11/30/2022   CREATININE 0.91 11/30/2022   BUN 14 11/30/2022   CO2 24 11/30/2022   TSH 1.690 08/10/2022    MM 3D SCREENING MAMMOGRAM BILATERAL BREAST  Result Date: 10/19/2022 CLINICAL DATA:  Screening. EXAM: DIGITAL SCREENING BILATERAL MAMMOGRAM WITH TOMOSYNTHESIS AND CAD TECHNIQUE: Bilateral screening digital craniocaudal and mediolateral oblique mammograms were obtained. Bilateral screening digital breast tomosynthesis was performed. The images were evaluated with computer-aided detection. COMPARISON:  Previous exam(s). ACR Breast Density Category b: There are scattered areas of fibroglandular density. FINDINGS: There are no findings suspicious for malignancy. IMPRESSION: No mammographic evidence of  malignancy. A result letter of this screening mammogram will be mailed directly to the patient. RECOMMENDATION: Screening mammogram in one year. (Code:SM-B-01Y) BI-RADS CATEGORY  1: Negative. Electronically Signed   By: Sande Brothers M.D.   On: 10/19/2022 08:54    Assessment & Plan:   Jozelyn was seen today for medical management of chronic issues.  Diagnoses and all orders for this  visit:  Mixed hyperlipidemia -     Lipid panel  Gastroesophageal reflux disease without esophagitis -     pantoprazole (PROTONIX) 40 MG tablet; TAKE 1 TABLET BY MOUTH ONCE DAILY FOR STOMACH  Primary hypertension -     CBC with Differential/Platelet -     CMP14+EGFR  Angular cheilitis  Mid-back pain, acute -     DG Thoracic Spine 2 View; Future  Other orders -     zinc gluconate 50 MG tablet; Take 1 tablet (50 mg total) by mouth daily. -     meloxicam (MOBIC) 7.5 MG tablet; Take 1 tablet (7.5 mg total) by mouth daily. For joint and muscle pain -     olmesartan (BENICAR) 20 MG tablet; Take 1 tablet (20 mg total) by mouth daily. For blood pressure   I am having Luanna Cole. Nolte "Germaine Pomfret" start on zinc gluconate and olmesartan. I am also having her maintain her aspirin EC, Vitamin D (Cholecalciferol), Omega-3, EQ Vision Formula 50+, zolpidem, ketoconazole, venlafaxine XR, diclofenac Sodium, atorvastatin, pantoprazole, and meloxicam.  Meds ordered this encounter  Medications   pantoprazole (PROTONIX) 40 MG tablet    Sig: TAKE 1 TABLET BY MOUTH ONCE DAILY FOR STOMACH    Dispense:  90 tablet    Refill:  3   zinc gluconate 50 MG tablet    Sig: Take 1 tablet (50 mg total) by mouth daily.   meloxicam (MOBIC) 7.5 MG tablet    Sig: Take 1 tablet (7.5 mg total) by mouth daily. For joint and muscle pain    Dispense:  30 tablet    Refill:  2   olmesartan (BENICAR) 20 MG tablet    Sig: Take 1 tablet (20 mg total) by mouth daily. For blood pressure    Dispense:  90 tablet    Refill:  1     Follow-up:  Return in about 6 months (around 06/01/2023) for cholesterol.  Mechele Claude, M.D.

## 2022-12-01 DIAGNOSIS — Z780 Asymptomatic menopausal state: Secondary | ICD-10-CM | POA: Diagnosis not present

## 2022-12-01 LAB — CMP14+EGFR
ALT: 14 IU/L (ref 0–32)
AST: 25 IU/L (ref 0–40)
Albumin: 4.6 g/dL (ref 3.8–4.8)
Alkaline Phosphatase: 79 IU/L (ref 44–121)
BUN/Creatinine Ratio: 15 (ref 12–28)
BUN: 14 mg/dL (ref 8–27)
Bilirubin Total: 0.3 mg/dL (ref 0.0–1.2)
CO2: 24 mmol/L (ref 20–29)
Calcium: 9.7 mg/dL (ref 8.7–10.3)
Chloride: 101 mmol/L (ref 96–106)
Creatinine, Ser: 0.91 mg/dL (ref 0.57–1.00)
Globulin, Total: 2.1 g/dL (ref 1.5–4.5)
Glucose: 90 mg/dL (ref 70–99)
Potassium: 4.3 mmol/L (ref 3.5–5.2)
Sodium: 141 mmol/L (ref 134–144)
Total Protein: 6.7 g/dL (ref 6.0–8.5)
eGFR: 66 mL/min/{1.73_m2} (ref >59)

## 2022-12-01 LAB — CBC WITH DIFFERENTIAL/PLATELET
Basophils Absolute: 0 10*3/uL (ref 0.0–0.2)
Basos: 1 %
EOS (ABSOLUTE): 0 10*3/uL (ref 0.0–0.4)
Eos: 1 %
Hematocrit: 33.4 % — ABNORMAL LOW (ref 34.0–46.6)
Hemoglobin: 11.3 g/dL (ref 11.1–15.9)
Immature Grans (Abs): 0 10*3/uL (ref 0.0–0.1)
Immature Granulocytes: 0 %
Lymphocytes Absolute: 1.9 10*3/uL (ref 0.7–3.1)
Lymphs: 45 %
MCH: 32.1 pg (ref 26.6–33.0)
MCHC: 33.8 g/dL (ref 31.5–35.7)
MCV: 95 fL (ref 79–97)
Monocytes Absolute: 0.3 10*3/uL (ref 0.1–0.9)
Monocytes: 8 %
Neutrophils Absolute: 2 10*3/uL (ref 1.4–7.0)
Neutrophils: 45 %
Platelets: 215 10*3/uL (ref 150–450)
RBC: 3.52 x10E6/uL — ABNORMAL LOW (ref 3.77–5.28)
RDW: 12.4 % (ref 11.7–15.4)
WBC: 4.3 10*3/uL (ref 3.4–10.8)

## 2022-12-01 LAB — LIPID PANEL
Chol/HDL Ratio: 2.5 ratio (ref 0.0–4.4)
Cholesterol, Total: 192 mg/dL (ref 100–199)
HDL: 77 mg/dL (ref >39)
LDL Chol Calc (NIH): 94 mg/dL (ref 0–99)
Triglycerides: 119 mg/dL (ref 0–149)
VLDL Cholesterol Cal: 21 mg/dL (ref 5–40)

## 2022-12-01 NOTE — Progress Notes (Signed)
Hello  Jacquita,    Your lab result is normal and/or stable.Some minor variations that are not significant are commonly marked abnormal, but do not represent any medical problem for you.   Best regards,  Mahdiya Mossberg, M.D.

## 2022-12-01 NOTE — Progress Notes (Signed)
DEXA shows osteopenia. I recommend weekly fosamax. Nurse, if pt. Is agreeable, send in Fosamax 70 mg weekly, #13.  Also use Calcium 500 mg twice a day. (Brand of your choice) Take vitamin D 2000 units daily.

## 2022-12-04 ENCOUNTER — Encounter: Payer: Self-pay | Admitting: Family Medicine

## 2022-12-05 ENCOUNTER — Ambulatory Visit (INDEPENDENT_AMBULATORY_CARE_PROVIDER_SITE_OTHER): Payer: Medicare Other

## 2022-12-05 ENCOUNTER — Other Ambulatory Visit: Payer: Self-pay | Admitting: *Deleted

## 2022-12-05 VITALS — Ht <= 58 in | Wt 94.0 lb

## 2022-12-05 DIAGNOSIS — Z0000 Encounter for general adult medical examination without abnormal findings: Secondary | ICD-10-CM

## 2022-12-05 MED ORDER — ALENDRONATE SODIUM 70 MG PO TABS: 70.0000 mg | ORAL_TABLET | ORAL | 3 refills | Status: DC

## 2022-12-05 NOTE — Patient Instructions (Signed)
Karina Clark , Thank you for taking time to come for your Medicare Wellness Visit. I appreciate your ongoing commitment to your health goals. Please review the following plan we discussed and let me know if I can assist you in the future.   These are the goals we discussed:  Goals      DIET - INCREASE WATER INTAKE     Exercise 3x per week (30 min per time)     Continue walking (currently at 45 minutes most days)  Stay active and independent (does not ever want to have to be taken care of)     Patient Stated     12/03/2021 AWV Goal: Fall Prevention  Over the next year, patient will decrease their risk for falls by: Using assistive devices, such as a cane or walker, as needed Identifying fall risks within their home and correcting them by: Removing throw rugs Adding handrails to stairs or ramps Removing clutter and keeping a clear pathway throughout the home Increasing light, especially at night Adding shower handles/bars Raising toilet seat Identifying potential personal risk factors for falls: Medication side effects Incontinence/urgency Vestibular dysfunction Hearing loss Musculoskeletal disorders Neurological disorders Orthostatic hypotension          This is a list of the screening recommended for you and due dates:  Health Maintenance  Topic Date Due   COVID-19 Vaccine (4 - 2023-24 season) 12/16/2022*   Flu Shot  01/05/2023   Colon Cancer Screening  07/26/2023   Medicare Annual Wellness Visit  12/05/2023   DEXA scan (bone density measurement)  11/30/2024   DTaP/Tdap/Td vaccine (2 - Td or Tdap) 10/23/2031   Pneumonia Vaccine  Completed   Hepatitis C Screening  Completed   Zoster (Shingles) Vaccine  Completed   HPV Vaccine  Aged Out  *Topic was postponed. The date shown is not the original due date.    Advanced directives: In Chart   Conditions/risks identified: Aim for 30 minutes of exercise or brisk walking, 6-8 glasses of water, and 5 servings of fruits and  vegetables each day.   Next appointment: Follow up in one year for your annual wellness visit    Preventive Care 65 Years and Older, Female Preventive care refers to lifestyle choices and visits with your health care provider that can promote health and wellness. What does preventive care include? A yearly physical exam. This is also called an annual well check. Dental exams once or twice a year. Routine eye exams. Ask your health care provider how often you should have your eyes checked. Personal lifestyle choices, including: Daily care of your teeth and gums. Regular physical activity. Eating a healthy diet. Avoiding tobacco and drug use. Limiting alcohol use. Practicing safe sex. Taking low-dose aspirin every day. Taking vitamin and mineral supplements as recommended by your health care provider. What happens during an annual well check? The services and screenings done by your health care provider during your annual well check will depend on your age, overall health, lifestyle risk factors, and family history of disease. Counseling  Your health care provider may ask you questions about your: Alcohol use. Tobacco use. Drug use. Emotional well-being. Home and relationship well-being. Sexual activity. Eating habits. History of falls. Memory and ability to understand (cognition). Work and work Astronomer. Reproductive health. Screening  You may have the following tests or measurements: Height, weight, and BMI. Blood pressure. Lipid and cholesterol levels. These may be checked every 5 years, or more frequently if you are over 50 years  old. Skin check. Lung cancer screening. You may have this screening every year starting at age 19 if you have a 30-pack-year history of smoking and currently smoke or have quit within the past 15 years. Fecal occult blood test (FOBT) of the stool. You may have this test every year starting at age 52. Flexible sigmoidoscopy or colonoscopy. You  may have a sigmoidoscopy every 5 years or a colonoscopy every 10 years starting at age 45. Hepatitis C blood test. Hepatitis B blood test. Sexually transmitted disease (STD) testing. Diabetes screening. This is done by checking your blood sugar (glucose) after you have not eaten for a while (fasting). You may have this done every 1-3 years. Bone density scan. This is done to screen for osteoporosis. You may have this done starting at age 68. Mammogram. This may be done every 1-2 years. Talk to your health care provider about how often you should have regular mammograms. Talk with your health care provider about your test results, treatment options, and if necessary, the need for more tests. Vaccines  Your health care provider may recommend certain vaccines, such as: Influenza vaccine. This is recommended every year. Tetanus, diphtheria, and acellular pertussis (Tdap, Td) vaccine. You may need a Td booster every 10 years. Zoster vaccine. You may need this after age 31. Pneumococcal 13-valent conjugate (PCV13) vaccine. One dose is recommended after age 95. Pneumococcal polysaccharide (PPSV23) vaccine. One dose is recommended after age 25. Talk to your health care provider about which screenings and vaccines you need and how often you need them. This information is not intended to replace advice given to you by your health care provider. Make sure you discuss any questions you have with your health care provider. Document Released: 06/19/2015 Document Revised: 02/10/2016 Document Reviewed: 03/24/2015 Elsevier Interactive Patient Education  2017 [employer].  Fall Prevention in the Home Falls can cause injuries. They can happen to people of all ages. There are many things you can do to make your home safe and to help prevent falls. What can I do on the outside of my home? Regularly fix the edges of walkways and driveways and fix any cracks. Remove anything that might make you trip as you walk  through a door, such as a raised step or threshold. Trim any bushes or trees on the path to your home. Use bright outdoor lighting. Clear any walking paths of anything that might make someone trip, such as rocks or tools. Regularly check to see if handrails are loose or broken. Make sure that both sides of any steps have handrails. Any raised decks and porches should have guardrails on the edges. Have any leaves, snow, or ice cleared regularly. Use sand or salt on walking paths during winter. Clean up any spills in your garage right away. This includes oil or grease spills. What can I do in the bathroom? Use night lights. Install grab bars by the toilet and in the tub and shower. Do not use towel bars as grab bars. Use non-skid mats or decals in the tub or shower. If you need to sit down in the shower, use a plastic, non-slip stool. Keep the floor dry. Clean up any water that spills on the floor as soon as it happens. Remove soap buildup in the tub or shower regularly. Attach bath mats securely with double-sided non-slip rug tape. Do not have throw rugs and other things on the floor that can make you trip. What can I do in the bedroom? Use night  lights. Make sure that you have a light by your bed that is easy to reach. Do not use any sheets or blankets that are too big for your bed. They should not hang down onto the floor. Have a firm chair that has side arms. You can use this for support while you get dressed. Do not have throw rugs and other things on the floor that can make you trip. What can I do in the kitchen? Clean up any spills right away. Avoid walking on wet floors. Keep items that you use a lot in easy-to-reach places. If you need to reach something above you, use a strong step stool that has a grab bar. Keep electrical cords out of the way. Do not use floor polish or wax that makes floors slippery. If you must use wax, use non-skid floor wax. Do not have throw rugs and  other things on the floor that can make you trip. What can I do with my stairs? Do not leave any items on the stairs. Make sure that there are handrails on both sides of the stairs and use them. Fix handrails that are broken or loose. Make sure that handrails are as long as the stairways. Check any carpeting to make sure that it is firmly attached to the stairs. Fix any carpet that is loose or worn. Avoid having throw rugs at the top or bottom of the stairs. If you do have throw rugs, attach them to the floor with carpet tape. Make sure that you have a light switch at the top of the stairs and the bottom of the stairs. If you do not have them, ask someone to add them for you. What else can I do to help prevent falls? Wear shoes that: Do not have high heels. Have rubber bottoms. Are comfortable and fit you well. Are closed at the toe. Do not wear sandals. If you use a stepladder: Make sure that it is fully opened. Do not climb a closed stepladder. Make sure that both sides of the stepladder are locked into place. Ask someone to hold it for you, if possible. Clearly mark and make sure that you can see: Any grab bars or handrails. First and last steps. Where the edge of each step is. Use tools that help you move around (mobility aids) if they are needed. These include: Canes. Walkers. Scooters. Crutches. Turn on the lights when you go into a dark area. Replace any light bulbs as soon as they burn out. Set up your furniture so you have a clear path. Avoid moving your furniture around. If any of your floors are uneven, fix them. If there are any pets around you, be aware of where they are. Review your medicines with your doctor. Some medicines can make you feel dizzy. This can increase your chance of falling. Ask your doctor what other things that you can do to help prevent falls. This information is not intended to replace advice given to you by your health care provider. Make sure you  discuss any questions you have with your health care provider. Document Released: 03/19/2009 Document Revised: 10/29/2015 Document Reviewed: 06/27/2014 Elsevier Interactive Patient Education  2017 [employer].

## 2022-12-05 NOTE — Progress Notes (Signed)
Subjective:   Karina Clark is a 77 y.o. female who presents for Medicare Annual (Subsequent) preventive examination.  Visit Complete: Virtual  I connected with  Lucy Antigua on 12/05/22 by a audio enabled telemedicine application and verified that I am speaking with the correct person using two identifiers.  Patient Location: Home  Provider Location: Home Office  I discussed the limitations of evaluation and management by telemedicine. The patient expressed understanding and agreed to proceed.  Patient Medicare AWV questionnaire was completed by the patient on 12/05/2022; I have confirmed that all information answered by patient is correct and no changes since this date.  Review of Systems     Cardiac Risk Factors include: advanced age (>61men, >64 women);dyslipidemia;hypertension     Objective:    Today's Vitals   12/05/22 1533  Weight: 94 lb (42.6 kg)  Height: 4\' 10"  (1.473 m)   Body mass index is 19.65 kg/m.     12/05/2022    3:36 PM 12/03/2021    9:51 AM 12/02/2019   10:26 AM 11/29/2018   10:12 AM 08/07/2017    2:26 PM 11/17/2015   11:25 AM  Advanced Directives  Does Patient Have a Medical Advance Directive? Yes Yes Yes Yes Yes No  Type of Estate agent of [place];Living will Healthcare Power of [place];Living will Healthcare Power of Attorney Living will;Healthcare Power of [employer] Power of [place];Living will   Does patient want to make changes to medical advance directive? No - Patient declined  No - Patient declined No - Patient declined No - Patient declined   Copy of Healthcare Power of Attorney in Chart? Yes - validated most recent copy scanned in chart (See row information) Yes - validated most recent copy scanned in chart (See row information) No - copy requested No - copy requested No - copy requested   Would patient like information on creating a medical advance directive?    No - Patient declined  No - patient declined  information;Yes - Educational materials given    Current Medications (verified) Outpatient Encounter Medications as of 12/05/2022  Medication Sig   aspirin EC 81 MG tablet Take 81 mg by mouth daily.   atorvastatin (LIPITOR) 20 MG tablet Take 1 tablet (20 mg total) by mouth daily. for cholesterol.   diclofenac Sodium (VOLTAREN) 1 % GEL Apply 2 g topically 4 (four) times daily as needed (right shoulder and elbow pain).   ketoconazole (NIZORAL) 2 % cream Apply 1 Application topically daily.   meloxicam (MOBIC) 7.5 MG tablet Take 1 tablet (7.5 mg total) by mouth daily. For joint and muscle pain   Multiple Vitamins-Minerals (EQ VISION FORMULA 50+) CAPS Take 1 capsule by mouth.   olmesartan (BENICAR) 20 MG tablet Take 1 tablet (20 mg total) by mouth daily. For blood pressure   Omega-3 1000 MG CAPS Take 3 g by mouth daily.    pantoprazole (PROTONIX) 40 MG tablet TAKE 1 TABLET BY MOUTH ONCE DAILY FOR STOMACH   venlafaxine XR (EFFEXOR-XR) 150 MG 24 hr capsule Take 1 capsule (150 mg total) by mouth at bedtime.   Vitamin D, Cholecalciferol, 1000 UNITS CAPS Take 2,000 Units by mouth daily.    zinc gluconate 50 MG tablet Take 1 tablet (50 mg total) by mouth daily.   zolpidem (AMBIEN) 5 MG tablet Take 1 tablet (5 mg total) by mouth at bedtime.   No facility-administered encounter medications on file as of 12/05/2022.    Allergies (verified) Seroquel [quetiapine], Mycelex [  clotrimazole], Naproxen, and Sulfamethoxazole-trimethoprim   History: Past Medical History:  Diagnosis Date   Anxiety    Cataract    Depression    GERD (gastroesophageal reflux disease)    Hyperlipidemia    Hypertension    Osteopenia    Substance abuse (HCC)    DCed cocaine in 1998   Past Surgical History:  Procedure Laterality Date   EYE SURGERY     NASAL SEPTUM SURGERY     TUBAL LIGATION     Family History  Problem Relation Age of Onset   Hyperlipidemia Mother    Thyroid disease Mother    Hip fracture Mother     Hypertension Father    Dementia Father    Cancer Brother        spine   Neuropathy Brother    Hypertension Daughter    Breast cancer Neg Hx    Colon cancer Neg Hx    Colon polyps Neg Hx    Social History   Socioeconomic History   Marital status: Widowed    Spouse name: Not on file   Number of children: 1   Years of education: 38   Highest education level: 12th grade  Occupational History   Occupation: Caregiver    Comment: retired  Tobacco Use   Smoking status: Former    Packs/day: .5    Types: Cigarettes    Start date: 03/24/1969    Quit date: 02/05/1995    Years since quitting: 27.8   Smokeless tobacco: Never  Vaping Use   Vaping Use: Never used  Substance and Sexual Activity   Alcohol use: No    Alcohol/week: 0.0 standard drinks of alcohol   Drug use: No   Sexual activity: Not Currently  Other Topics Concern   Not on file  Social History Narrative   Widowed since 1998. She has one adult daughter and 2 grandchildren. She enjoys reading. She lives in a one story home with steps up onto her deck with a handrail.    Her daughter has mental health issues - she stays worried about her and she is helping care for her, going to her house daily   Social Determinants of Health   Financial Resource Strain: Low Risk  (12/05/2022)   Overall Financial Resource Strain (CARDIA)    Difficulty of Paying Living Expenses: Not hard at all  Food Insecurity: No Food Insecurity (12/05/2022)   Hunger Vital Sign    Worried About Running Out of Food in the Last Year: Never true    Ran Out of Food in the Last Year: Never true  Transportation Needs: No Transportation Needs (12/05/2022)   PRAPARE - Administrator, Civil Service (Medical): No    Lack of Transportation (Non-Medical): No  Physical Activity: Inactive (12/05/2022)   Exercise Vital Sign    Days of Exercise per Week: 0 days    Minutes of Exercise per Session: 0 min  Stress: No Stress Concern Present (12/05/2022)   [employer] of Occupational Health - Occupational Stress Questionnaire    Feeling of Stress : Not at all  Social Connections: Moderately Isolated (12/05/2022)   Social Connection and Isolation Panel [NHANES]    Frequency of Communication with Friends and Family: More than three times a week    Frequency of Social Gatherings with Friends and Family: More than three times a week    Attends Religious Services: More than 4 times per year    Active Member of Clubs or Organizations: No  Attends Banker Meetings: Never    Marital Status: Widowed    Tobacco Counseling Counseling given: Not Answered   Clinical Intake:  Pre-visit preparation completed: Yes  Pain : No/denies pain     Nutritional Risks: None Diabetes: No  How often do you need to have someone help you when you read instructions, pamphlets, or other written materials from your doctor or pharmacy?: 1 - Never  Interpreter Needed?: No  Information entered by :: Renie Ora, LPN   Activities of Daily Living    12/05/2022    3:37 PM 12/04/2022    8:43 PM  In your present state of health, do you have any difficulty performing the following activities:  Hearing? 0 0  Vision? 0 0  Difficulty concentrating or making decisions? 0 1  Walking or climbing stairs? 0 0  Dressing or bathing? 0 0  Doing errands, shopping? 0 0  Preparing Food and eating ? N N  Using the Toilet? N N  In the past six months, have you accidently leaked urine? N Y  Do you have problems with loss of bowel control? N N  Managing your Medications? N N  Managing your Finances? N N  Housekeeping or managing your Housekeeping? N N    Patient Care Team: Mechele Claude, MD as PCP - General (Family Medicine) Marcelino Duster, MD as Referring Physician (Dermatology) Michaelle Copas, MD as Referring Physician (Optometry)  Indicate any recent Medical Services you may have received from other than [facility] providers in the past year (date may be  approximate).     Assessment:   This is a routine wellness examination for [facility].  Hearing/Vision screen Vision Screening - Comments:: Wears rx glasses - up to date with routine eye exams with  Dr.Lee   Dietary issues and exercise activities discussed:     Goals Addressed             This Visit's Progress    DIET - INCREASE WATER INTAKE         Depression Screen    12/05/2022    3:35 PM 11/30/2022    1:01 PM 08/30/2022    1:03 PM 08/30/2022   12:54 PM 08/10/2022    1:18 PM 08/10/2022    1:10 PM 05/03/2022    3:16 PM  PHQ 2/9 Scores  PHQ - 2 Score 0 2 4 0 2 0 2  PHQ- 9 Score 0 5 8  8  4     Fall Risk    12/05/2022    3:34 PM 12/04/2022    8:43 PM 11/30/2022    1:01 PM 08/30/2022   12:54 PM 08/10/2022    1:10 PM  Fall Risk   Falls in the past year? 0 1 1 0 0  Number falls in past yr: 0 0 0    Injury with Fall? 0 0 0    Risk for fall due to : No Fall Risks  History of fall(s)    Follow up Falls prevention discussed  Falls evaluation completed      MEDICARE RISK AT HOME:  Medicare Risk at Home - 12/05/22 1534     Any stairs in or around the home? No    If so, are there any without handrails? No    Home free of loose throw rugs in walkways, pet beds, electrical cords, etc? Yes    Adequate lighting in your home to reduce risk of falls? Yes    Life alert? No  Use of a cane, walker or w/c? No    Grab bars in the bathroom? Yes    Shower chair or bench in shower? No    Elevated toilet seat or a handicapped toilet? No             TIMED UP AND GO:  Was the test performed?  No    Cognitive Function:    08/07/2017    2:40 PM 11/17/2015   11:38 AM  MMSE - Mini Mental State Exam  Orientation to time 5 5  Orientation to Place 5 5  Registration 3 3  Attention/ Calculation 5 5  Recall 3 3  Language- name 2 objects 2 2  Language- repeat 1 1  Language- follow 3 step command 3 3  Language- read & follow direction 1 1  Write a sentence 1 1  Copy design 1 1  Total  score 30 30        12/05/2022    3:37 PM 12/03/2021    9:53 AM 12/02/2019   10:35 AM 11/29/2018   10:17 AM  6CIT Screen  What Year? 0 points 0 points 0 points 0 points  What month? 0 points 0 points  0 points  What time? 0 points 0 points 0 points 0 points  Count back from 20 0 points 0 points 0 points 0 points  Months in reverse 0 points 0 points 0 points 2 points  Repeat phrase 0 points 0 points 0 points 0 points  Total Score 0 points 0 points  2 points    Immunizations Immunization History  Administered Date(s) Administered   Fluad Quad(high Dose 65+) 02/19/2019, 03/13/2020, 03/08/2021, 03/02/2022   Hepatitis A, Adult 11/09/2005   Influenza, High Dose Seasonal PF 02/23/2016, 02/27/2017, 03/14/2018   Influenza, Seasonal, Injecte, Preservative Fre 03/04/2014   Influenza,inj,Quad PF,6+ Mos 03/05/2015   Influenza,trivalent, recombinat, inj, PF 04/03/2013   Influenza-Unspecified 05/09/2012   Moderna SARS-COV2 Booster Vaccination 09/10/2020, 03/09/2021   Moderna Sars-Covid-2 Vaccination 09/25/2019, 10/23/2019, 04/24/2020   Pneumococcal Conjugate-13 11/28/2013   Pneumococcal Polysaccharide-23 05/09/2012   Tdap 10/22/2021   Zoster Recombinant(Shingrix) 06/25/2018, 06/25/2018, 08/20/2018   Zoster, Live 01/07/2008    TDAP status: Up to date  Flu Vaccine status: Up to date  Pneumococcal vaccine status: Up to date  Covid-19 vaccine status: Completed vaccines  Qualifies for Shingles Vaccine? Yes   Zostavax completed Yes   Shingrix Completed?: Yes  Screening Tests Health Maintenance  Topic Date Due   COVID-19 Vaccine (4 - 2023-24 season) 12/16/2022 (Originally 02/04/2022)   INFLUENZA VACCINE  01/05/2023   Colonoscopy  07/26/2023   Medicare Annual Wellness (AWV)  12/05/2023   DEXA SCAN  11/30/2024   DTaP/Tdap/Td (2 - Td or Tdap) 10/23/2031   Pneumonia Vaccine 93+ Years old  Completed   Hepatitis C Screening  Completed   Zoster Vaccines- Shingrix  Completed   HPV VACCINES   Aged Out    Health Maintenance  There are no preventive care reminders to display for this patient.   Colorectal cancer screening: No longer required.   Mammogram status: No longer required due to age.  Bone Density status: Completed 12/01/2022. Results reflect: Bone density results: OSTEOPOROSIS. Repeat every 2 years.  Lung Cancer Screening: (Low Dose CT Chest recommended if Age 40-80 years, 20 pack-year currently smoking OR have quit w/in 15years.) does not qualify.   Lung Cancer Screening Referral: n/a  Additional Screening:  Hepatitis C Screening: does not qualify; Completed 11/17/2015  Vision Screening: Recommended annual ophthalmology  exams for early detection of glaucoma and other disorders of the eye. Is the patient up to date with their annual eye exam?  Yes  Who is the provider or what is the name of the office in which the patient attends annual eye exams? Dr.Lee  If pt is not established with a provider, would they like to be referred to a provider to establish care? No .   Dental Screening: Recommended annual dental exams for proper oral hygiene  Diabetic Foot Exam: Diabetic Foot Exam: Overdue, Pt has been advised about the importance in completing this exam. Pt is scheduled for diabetic foot exam on next office visit .  Community Resource Referral / Chronic Care Management: CRR required this visit?  No   CCM required this visit?  No     Plan:     I have personally reviewed and noted the following in the patient's chart:   Medical and social history Use of alcohol, tobacco or illicit drugs  Current medications and supplements including opioid prescriptions. Patient is not currently taking opioid prescriptions. Functional ability and status Nutritional status Physical activity Advanced directives List of other physicians Hospitalizations, surgeries, and ER visits in previous 12 months Vitals Screenings to include cognitive, depression, and  falls Referrals and appointments  In addition, I have reviewed and discussed with patient certain preventive protocols, quality metrics, and best practice recommendations. A written personalized care plan for preventive services as well as general preventive health recommendations were provided to patient.     Lorrene Reid, LPN   10/08/979   After Visit Summary: (MyChart) Due to this being a telephonic visit, the after visit summary with patients personalized plan was offered to patient via MyChart   Nurse Notes: none

## 2023-01-02 DIAGNOSIS — I6782 Cerebral ischemia: Secondary | ICD-10-CM | POA: Diagnosis not present

## 2023-01-02 DIAGNOSIS — Z87891 Personal history of nicotine dependence: Secondary | ICD-10-CM | POA: Diagnosis not present

## 2023-01-02 DIAGNOSIS — I4510 Unspecified right bundle-branch block: Secondary | ICD-10-CM | POA: Diagnosis not present

## 2023-01-02 DIAGNOSIS — Z91030 Bee allergy status: Secondary | ICD-10-CM | POA: Diagnosis not present

## 2023-01-02 DIAGNOSIS — R9431 Abnormal electrocardiogram [ECG] [EKG]: Secondary | ICD-10-CM | POA: Diagnosis not present

## 2023-01-09 ENCOUNTER — Encounter: Payer: Self-pay | Admitting: Family Medicine

## 2023-01-09 ENCOUNTER — Ambulatory Visit: Payer: Medicare Other | Admitting: Family Medicine

## 2023-01-09 VITALS — BP 135/66 | HR 77 | Temp 98.0°F | Ht <= 58 in | Wt 93.6 lb

## 2023-01-09 DIAGNOSIS — T50905A Adverse effect of unspecified drugs, medicaments and biological substances, initial encounter: Secondary | ICD-10-CM | POA: Diagnosis not present

## 2023-01-09 DIAGNOSIS — Z79899 Other long term (current) drug therapy: Secondary | ICD-10-CM

## 2023-01-09 DIAGNOSIS — F5101 Primary insomnia: Secondary | ICD-10-CM

## 2023-01-09 DIAGNOSIS — Z91030 Bee allergy status: Secondary | ICD-10-CM | POA: Insufficient documentation

## 2023-01-09 DIAGNOSIS — F3342 Major depressive disorder, recurrent, in full remission: Secondary | ICD-10-CM | POA: Diagnosis not present

## 2023-01-09 NOTE — Progress Notes (Signed)
Subjective:  Patient ID: Karina Clark, female    DOB: 08-04-1945  Age: 77 y.o. MRN: [id number]  CC: Hospitalization Follow-up   HPI Karina Clark presents for several bee stings on 7/27. Took Benadryl x2 on 7/27, Two more the next morning. Doesn't remember going to church that day. Took several more through the day on 7/28. Started hallucinating. Went to neighbors house. Daughter took her to the E.D. on 7/29. Memory has been terrible since. Stopped the [place] and switched to [place]. Didn't sleep well with that. Now just taking the venlafaxine. She is sleeping well with that. Sx have not been present since the benadryl wore off last week.      01/09/2023    4:20 PM 01/09/2023    3:57 PM 12/05/2022    3:35 PM  Depression screen PHQ 2/9  Decreased Interest 1 0 0  Down, Depressed, Hopeless 1 0 0  PHQ - 2 Score 2 0 0  Altered sleeping 1  0  Tired, decreased energy 1  0  Change in appetite 1  0  Feeling bad or failure about yourself  1  0  Trouble concentrating 1  0  Moving slowly or fidgety/restless 0  0  Suicidal thoughts 0  0  PHQ-9 Score 7  0  Difficult doing work/chores Somewhat difficult  Not difficult at all    History Tesla has a past medical history of Anxiety, Cataract, Depression, GERD (gastroesophageal reflux disease), Hyperlipidemia, Hypertension, Osteopenia, and Substance abuse (HCC).   She has a past surgical history that includes Tubal ligation; Eye surgery; and Nasal septum surgery.   Her family history includes Cancer in her brother; Dementia in her father; Hip fracture in her mother; Hyperlipidemia in her mother; Hypertension in her daughter and father; Neuropathy in her brother; Thyroid disease in her mother.She reports that she quit smoking about 27 years ago. Her smoking use included cigarettes. She started smoking about 53 years ago. She has a 12.9 pack-year smoking history. She has never used smokeless tobacco. She reports that she does not drink alcohol and does not  use drugs.    ROS Review of Systems  Constitutional: Negative.   HENT: Negative.    Eyes:  Negative for visual disturbance.  Respiratory:  Negative for shortness of breath.   Cardiovascular:  Negative for chest pain.  Gastrointestinal:  Negative for abdominal pain.  Musculoskeletal:  Negative for arthralgias.  Psychiatric/Behavioral:  Positive for confusion (related to use of benadryl and ambien).     Objective:  BP 135/66   Pulse 77   Temp 98 F (36.7 C)   Ht 4\' 10"  (1.473 m)   Wt 93 lb 9.6 oz (42.5 kg)   SpO2 100%   BMI 19.56 kg/m   BP Readings from Last 3 Encounters:  01/09/23 135/66  11/30/22 (!) 151/73  08/30/22 (!) 160/82    Wt Readings from Last 3 Encounters:  01/09/23 93 lb 9.6 oz (42.5 kg)  12/05/22 94 lb (42.6 kg)  11/30/22 94 lb 6.4 oz (42.8 kg)     Physical Exam Constitutional:      General: She is not in acute distress.    Appearance: She is well-developed.  Cardiovascular:     Rate and Rhythm: Normal rate and regular rhythm.  Pulmonary:     Breath sounds: Normal breath sounds.  Musculoskeletal:        General: Normal range of motion.  Skin:    General: Skin is warm and dry.  Neurological:  Mental Status: She is alert and oriented to person, place, and time.       Assessment & Plan:   Eyanna "Jerelyn Pask" was seen today for hospitalization follow-up.  Diagnoses and all orders for this visit:  Long term prescription benzodiazepine use  Bee sting allergy  Recurrent major depressive disorder, in full remission (HCC)  Adverse effect of drug, initial encounter  Primary insomnia       I have discontinued Luanna Cole. Wissinger "Ginevra Jane"'s zolpidem. I am also having her maintain her aspirin EC, Vitamin D (Cholecalciferol), Omega-3, EQ Vision Formula 50+, ketoconazole, venlafaxine XR, diclofenac Sodium, atorvastatin, pantoprazole, zinc gluconate, meloxicam, olmesartan, and alendronate.  Allergies as of 01/09/2023       Reactions    Seroquel [quetiapine]    Drowsiness the next morning   Mycelex [clotrimazole] Other (See Comments)   Worsened GERD / nausea   Naproxen Other (See Comments)   "Makes my stomach burn"   Sulfamethoxazole-trimethoprim Nausea And Vomiting        Medication List        Accurate as of January 09, 2023  9:33 PM. If you have any questions, ask your nurse or doctor.          STOP taking these medications    zolpidem 5 MG tablet Commonly known as: AMBIEN Stopped by: Murice Barbar       TAKE these medications    alendronate 70 MG tablet Commonly known as: FOSAMAX Take 1 tablet (70 mg total) by mouth every 7 (seven) days. Take with a full glass of water on an empty stomach.   aspirin EC 81 MG tablet Take 81 mg by mouth daily.   atorvastatin 20 MG tablet Commonly known as: LIPITOR Take 1 tablet (20 mg total) by mouth daily. for cholesterol.   diclofenac Sodium 1 % Gel Commonly known as: Voltaren Apply 2 g topically 4 (four) times daily as needed (right shoulder and elbow pain).   EQ Vision Formula 50+ Caps Take 1 capsule by mouth.   ketoconazole 2 % cream Commonly known as: NIZORAL Apply 1 Application topically daily.   meloxicam 7.5 MG tablet Commonly known as: MOBIC Take 1 tablet (7.5 mg total) by mouth daily. For joint and muscle pain   olmesartan 20 MG tablet Commonly known as: Benicar Take 1 tablet (20 mg total) by mouth daily. For blood pressure   Omega-3 1000 MG Caps Take 3 g by mouth daily.   pantoprazole 40 MG tablet Commonly known as: PROTONIX TAKE 1 TABLET BY MOUTH ONCE DAILY FOR STOMACH   venlafaxine XR 150 MG 24 hr capsule Commonly known as: EFFEXOR-XR Take 1 capsule (150 mg total) by mouth at bedtime.   Vitamin D (Cholecalciferol) 25 MCG (1000 UT) Caps Take 2,000 Units by mouth daily.   zinc gluconate 50 MG tablet Take 1 tablet (50 mg total) by mouth daily.         Follow-up: No follow-ups on file.  Mechele Claude, M.D.

## 2023-03-02 ENCOUNTER — Other Ambulatory Visit: Payer: Self-pay | Admitting: Family Medicine

## 2023-03-04 ENCOUNTER — Other Ambulatory Visit: Payer: Self-pay | Admitting: Family Medicine

## 2023-03-06 ENCOUNTER — Other Ambulatory Visit: Payer: Self-pay | Admitting: Family Medicine

## 2023-03-29 ENCOUNTER — Other Ambulatory Visit: Payer: Self-pay | Admitting: Family Medicine

## 2023-05-05 ENCOUNTER — Other Ambulatory Visit: Payer: Self-pay | Admitting: Family Medicine

## 2023-05-29 ENCOUNTER — Ambulatory Visit (INDEPENDENT_AMBULATORY_CARE_PROVIDER_SITE_OTHER): Payer: Medicare Other | Admitting: Family Medicine

## 2023-05-29 ENCOUNTER — Encounter: Payer: Self-pay | Admitting: Family Medicine

## 2023-05-29 VITALS — BP 130/70 | HR 65 | Temp 97.6°F | Ht <= 58 in | Wt 95.2 lb

## 2023-05-29 DIAGNOSIS — I10 Essential (primary) hypertension: Secondary | ICD-10-CM | POA: Diagnosis not present

## 2023-05-29 DIAGNOSIS — R0982 Postnasal drip: Secondary | ICD-10-CM | POA: Diagnosis not present

## 2023-05-29 DIAGNOSIS — J310 Chronic rhinitis: Secondary | ICD-10-CM

## 2023-05-29 MED ORDER — FLUTICASONE PROPIONATE 50 MCG/ACT NA SUSP
2.0000 | Freq: Every day | NASAL | 6 refills | Status: AC
Start: 2023-05-29 — End: ?

## 2023-05-29 MED ORDER — LEVOCETIRIZINE DIHYDROCHLORIDE 5 MG PO TABS
5.0000 mg | ORAL_TABLET | Freq: Every evening | ORAL | 3 refills | Status: DC
Start: 2023-05-29 — End: 2024-04-03

## 2023-05-29 NOTE — Progress Notes (Signed)
   Acute Office Visit  Subjective:     Patient ID: Karina Clark, female    DOB: June 18, 1945, 77 y.o.   MRN: [id number]  Chief Complaint  Patient presents with   throat problem    HPI Patient is in today for a sensation that something is in the back of her throat for 2 weeks. She reports chronic nasal congestion and runny nose that alternates. Throat feel irritated. Denies trouble swallowing, fever, chills, heartburn. She has tried afrin without relief.   ROS As per HPI.      Objective:    BP (!) 151/77   Pulse 65   Temp 97.6 F (36.4 C) (Temporal)   Ht 4\' 10"  (1.473 m)   Wt 95 lb 3.2 oz (43.2 kg)   SpO2 100%   BMI 19.90 kg/m    Physical Exam Vitals and nursing note reviewed.  Constitutional:      General: She is not in acute distress.    Appearance: Normal appearance. She is not ill-appearing, toxic-appearing or diaphoretic.  HENT:     Right Ear: Tympanic membrane, ear canal and external ear normal.     Left Ear: Tympanic membrane, ear canal and external ear normal.     Nose: Congestion present.     Mouth/Throat:     Mouth: Mucous membranes are moist.     Pharynx: Postnasal drip present. No pharyngeal swelling, oropharyngeal exudate, posterior oropharyngeal erythema or uvula swelling.     Tonsils: No tonsillar exudate. 1+ on the right. 1+ on the left.  Cardiovascular:     Rate and Rhythm: Normal rate and regular rhythm.     Heart sounds: Normal heart sounds. No murmur heard. Pulmonary:     Effort: Pulmonary effort is normal. No respiratory distress.     Breath sounds: Normal breath sounds. No wheezing.  Musculoskeletal:     Cervical back: Neck supple. No rigidity.  Skin:    General: Skin is warm and dry.  Neurological:     Mental Status: She is alert and oriented to person, place, and time. Mental status is at baseline.  Psychiatric:        Mood and Affect: Mood normal.        Behavior: Behavior normal.     No results found for any visits on  05/29/23.      Assessment & Plan:   Karina "Karina Clark" was seen today for throat problem.  Diagnoses and all orders for this visit:  Postnasal drip Chronic rhinitis Xyzal nightly. Can add flonase if no improvement.  -     fluticasone (FLONASE) 50 MCG/ACT nasal spray; Place 2 sprays into both nostrils daily. -     levocetirizine (XYZAL) 5 MG tablet; Take 1 tablet (5 mg total) by mouth every evening.  Primary hypertension BP not at goal. Monitor at home and notify PCP for elevated readings.   Return if symptoms worsen or fail to improve.  Gabriel Earing, FNP

## 2023-05-29 NOTE — Patient Instructions (Signed)
Postnasal Drip Postnasal drip is the feeling of mucus going down the back of your throat. Mucus is a slimy substance that moistens and cleans your nose and throat, as well as the air pockets in face bones near your forehead and cheeks (sinuses). Small amounts of mucus pass from your nose and sinuses down the back of your throat all the time. This is normal. When you produce too much mucus or the mucus gets too thick, you can feel it. Some common causes of postnasal drip include: Having more mucus because of: A cold or the flu. Allergies. Cold air. Certain medicines. Gastroesophageal reflux. Having more mucus that is thicker because of: A sinus or nasal infection. Dry air. A food allergy. Follow these instructions at home: Relieving discomfort  Gargle with a mixture of salt and water 3-4 times a day or as needed. To make salt water, completely dissolve -1 tsp (3-6 g) of salt in 1 cup (237 mL) of warm water. If the air in your home is dry, use a humidifier to add moisture to the air. Use a saline spray or a container (neti pot) to flush out the nose (nasal irrigation). These methods can help clear away mucus and keep the nasal passages moist. General instructions Take over-the-counter and prescription medicines only as told by your health care provider. Follow instructions from your health care provider about eating or drinking restrictions. You may need to avoid caffeine. Avoid things that you know you are allergic to (allergens), like dust, mold, pollen, pets, or certain foods. Drink enough fluid to keep your urine pale yellow. Keep all follow-up visits. This is important. Contact a health care provider if: You have a fever. You have a sore throat or difficulty swallowing. You have a headache. You have sinus or ear pain. You have a cough that does not go away. The mucus from your nose becomes thick and is green or yellow in color. You have cold or flu symptoms that last more than 10  days. Summary Postnasal drip is the feeling of mucus going down the back of your throat. Use nasal irrigation or a nasal spray to help clear away mucus and keep the nasal passages moist. Avoid things that you know you are allergic to (allergens), like dust, mold, pollen, pets, or certain foods. This information is not intended to replace advice given to you by your health care provider. Make sure you discuss any questions you have with your health care provider. Document Revised: 04/22/2021 Document Reviewed: 04/22/2021 Elsevier Patient Education  2024 [employer] Inc.  

## 2023-05-30 ENCOUNTER — Other Ambulatory Visit: Payer: Self-pay | Admitting: Family Medicine

## 2023-06-03 ENCOUNTER — Other Ambulatory Visit: Payer: Self-pay | Admitting: Family Medicine

## 2023-06-05 ENCOUNTER — Encounter: Payer: Self-pay | Admitting: Family Medicine

## 2023-06-05 ENCOUNTER — Telehealth: Payer: Self-pay

## 2023-06-05 ENCOUNTER — Ambulatory Visit: Payer: Medicare Other | Admitting: Family Medicine

## 2023-06-05 VITALS — BP 158/69 | HR 73 | Temp 97.5°F | Ht <= 58 in | Wt 94.8 lb

## 2023-06-05 DIAGNOSIS — F5101 Primary insomnia: Secondary | ICD-10-CM

## 2023-06-05 DIAGNOSIS — I10 Essential (primary) hypertension: Secondary | ICD-10-CM

## 2023-06-05 DIAGNOSIS — E782 Mixed hyperlipidemia: Secondary | ICD-10-CM

## 2023-06-05 DIAGNOSIS — R2689 Other abnormalities of gait and mobility: Secondary | ICD-10-CM | POA: Diagnosis not present

## 2023-06-05 DIAGNOSIS — J309 Allergic rhinitis, unspecified: Secondary | ICD-10-CM | POA: Diagnosis not present

## 2023-06-05 MED ORDER — VENLAFAXINE HCL ER 150 MG PO CP24: 150.0000 mg | ORAL_CAPSULE | Freq: Every day | ORAL | 3 refills | Status: DC

## 2023-06-05 MED ORDER — OLMESARTAN MEDOXOMIL 20 MG PO TABS: 20.0000 mg | ORAL_TABLET | Freq: Every day | ORAL | 3 refills | Status: DC

## 2023-06-05 MED ORDER — BETAMETHASONE SOD PHOS & ACET 6 (3-3) MG/ML IJ SUSP
6.0000 mg | Freq: Once | INTRAMUSCULAR | Status: AC
Start: 2023-06-05 — End: 2023-06-05
  Administered 2023-06-05: 6 mg via INTRAMUSCULAR

## 2023-06-05 MED ORDER — TRAZODONE HCL 150 MG PO TABS
ORAL_TABLET | ORAL | 5 refills | Status: DC
Start: 2023-06-05 — End: 2024-01-23

## 2023-06-05 NOTE — Telephone Encounter (Signed)
Returned call and answered questions 

## 2023-06-05 NOTE — Telephone Encounter (Signed)
Copied from CRM [id number]. Topic: Clinical - Medication Question >> Jun 05, 2023 12:23 PM Geroge Baseman wrote: Reason for CRM: Patient wants to know if she needs to take the levocetirizine (XYZAL) 5 MG tablet still daily since she got a cortisone shot? Please reach out to patient I believe she is just connfused.

## 2023-06-05 NOTE — Progress Notes (Signed)
Subjective:  Patient ID: Karina Clark, female    DOB: 01/22/1946  Age: 77 y.o. MRN: [id number]  CC: Medical Management of Chronic Issues   HPI CATI UHLE presents for  presents for  follow-up of hypertension. Patient has no history of headache chest pain or shortness of breath or recent cough. Patient also denies symptoms of TIA such as focal numbness or weakness. Patient denies side effects from medication. States taking it regularly. [name] BP usually 133/70. Today's is high because her allergies are keeping her awake.    in for follow-up of elevated cholesterol. Doing well without complaints on current medication. Denies side effects of statin including myalgia and arthralgia and nausea. Currently no chest pain, shortness of breath or other cardiovascular related symptoms noted.         06/05/2023   11:30 AM 06/05/2023   11:14 AM 05/29/2023    2:06 PM  Depression screen PHQ 2/9  Decreased Interest 1 0 1  Down, Depressed, Hopeless 1 0 1  PHQ - 2 Score 2 0 2  Altered sleeping 2  3  Tired, decreased energy 2  2  Change in appetite 1  2  Feeling bad or failure about yourself  1  1  Trouble concentrating 1  1  Moving slowly or fidgety/restless 0  1  Suicidal thoughts 0  0  PHQ-9 Score 9  12  Difficult doing work/chores Somewhat difficult  Somewhat difficult    History Christyn has a past medical history of Anxiety, Cataract, Depression, GERD (gastroesophageal reflux disease), Hyperlipidemia, Hypertension, Osteopenia, and Substance abuse (HCC).   She has a past surgical history that includes Tubal ligation; Eye surgery; and Nasal septum surgery.   Her family history includes Cancer in her brother; Dementia in her father; Hip fracture in her mother; Hyperlipidemia in her mother; Hypertension in her daughter and father; Neuropathy in her brother; Thyroid disease in her mother.She reports that she quit smoking about 28 years ago. Her smoking use included cigarettes. She started  smoking about 54 years ago. She has a 12.9 pack-year smoking history. She has never used smokeless tobacco. She reports that she does not drink alcohol and does not use drugs.    ROS Review of Systems  Constitutional: Negative.  Negative for fever.  HENT:  Positive for postnasal drip, rhinorrhea and sore throat.   Eyes:  Negative for visual disturbance.  Respiratory:  Negative for shortness of breath.   Cardiovascular:  Negative for chest pain.  Gastrointestinal:  Negative for abdominal pain.  Musculoskeletal:  Negative for arthralgias and myalgias.    Objective:  BP (!) 158/69   Pulse 73   Temp (!) 97.5 F (36.4 C)   Ht 4\' 10"  (1.473 m)   Wt 94 lb 12.8 oz (43 kg)   SpO2 95%   BMI 19.81 kg/m   BP Readings from Last 3 Encounters:  06/05/23 (!) 158/69  05/29/23 130/70  01/09/23 135/66    Wt Readings from Last 3 Encounters:  06/05/23 94 lb 12.8 oz (43 kg)  05/29/23 95 lb 3.2 oz (43.2 kg)  01/09/23 93 lb 9.6 oz (42.5 kg)     Physical Exam Constitutional:      General: She is not in acute distress.    Appearance: She is well-developed.  HENT:     Nose: Congestion present.  Cardiovascular:     Rate and Rhythm: Normal rate and regular rhythm.  Pulmonary:     Breath sounds: Normal breath sounds.  Musculoskeletal:  General: Normal range of motion.  Skin:    General: Skin is warm and dry.  Neurological:     Mental Status: She is alert and oriented to person, place, and time.       Assessment & Plan:   Sharnay "Teianna Pressly" was seen today for medical management of chronic issues.  Diagnoses and all orders for this visit:  Primary hypertension -     CBC with Differential/Platelet -     CMP14+EGFR  Mixed hyperlipidemia -     Lipid panel  Allergic rhinitis, unspecified seasonality, unspecified trigger -     betamethasone acetate-betamethasone sodium phosphate (CELESTONE) injection 6 mg  Primary insomnia -     traZODone (DESYREL) 150 MG tablet; Use from  1/3 to 1 tablet nightly as needed for sleep.  Other orders -     olmesartan (BENICAR) 20 MG tablet; Take 1 tablet (20 mg total) by mouth daily. for blood pressure -     venlafaxine XR (EFFEXOR-XR) 150 MG 24 hr capsule; Take 1 capsule (150 mg total) by mouth at bedtime.       I have discontinued Luanna Cole. Barona "Shaquandra Jane"'s EQ Vision Formula 50+. I have also changed her olmesartan and venlafaxine XR. Additionally, I am having her start on traZODone. Lastly, I am having her maintain her aspirin EC, Vitamin D (Cholecalciferol), Omega-3, ketoconazole, diclofenac Sodium, atorvastatin, pantoprazole, zinc gluconate, alendronate, meloxicam, levocetirizine, and fluticasone. We will continue to administer betamethasone acetate-betamethasone sodium phosphate.  Allergies as of 06/05/2023       Reactions   Seroquel [quetiapine]    Drowsiness the next morning   Mycelex [clotrimazole] Other (See Comments)   Worsened GERD / nausea   Naproxen Other (See Comments)   "Makes my stomach burn"   Sulfamethoxazole-trimethoprim Nausea And Vomiting        Medication List        Accurate as of June 05, 2023 11:58 AM. If you have any questions, ask your nurse or doctor.          STOP taking these medications    EQ Vision Formula 50+ Caps Stopped by: Carlisle Torgeson       TAKE these medications    alendronate 70 MG tablet Commonly known as: FOSAMAX Take 1 tablet (70 mg total) by mouth every 7 (seven) days. Take with a full glass of water on an empty stomach.   aspirin EC 81 MG tablet Take 81 mg by mouth daily.   atorvastatin 20 MG tablet Commonly known as: LIPITOR Take 1 tablet (20 mg total) by mouth daily. for cholesterol.   diclofenac Sodium 1 % Gel Commonly known as: Voltaren Apply 2 g topically 4 (four) times daily as needed (right shoulder and elbow pain).   fluticasone 50 MCG/ACT nasal spray Commonly known as: FLONASE Place 2 sprays into both nostrils daily.    ketoconazole 2 % cream Commonly known as: NIZORAL Apply 1 Application topically daily.   levocetirizine 5 MG tablet Commonly known as: XYZAL Take 1 tablet (5 mg total) by mouth every evening.   meloxicam 7.5 MG tablet Commonly known as: MOBIC TAKE 1 TABLET BY MOUTH ONCE DAILY FOR  MUSCLE  AND  JOINT  PAIN   olmesartan 20 MG tablet Commonly known as: BENICAR Take 1 tablet (20 mg total) by mouth daily. for blood pressure   Omega-3 1000 MG Caps Take 3 g by mouth daily.   pantoprazole 40 MG tablet Commonly known as: PROTONIX TAKE 1 TABLET BY MOUTH ONCE  DAILY FOR STOMACH   traZODone 150 MG tablet Commonly known as: DESYREL Use from 1/3 to 1 tablet nightly as needed for sleep. Started by: Broadus John Decie Verne   venlafaxine XR 150 MG 24 hr capsule Commonly known as: EFFEXOR-XR Take 1 capsule (150 mg total) by mouth at bedtime.   Vitamin D (Cholecalciferol) 25 MCG (1000 UT) Caps Take 2,000 Units by mouth daily.   zinc gluconate 50 MG tablet Take 1 tablet (50 mg total) by mouth daily.         Follow-up: Return in about 6 months (around 12/04/2023) for hypertension, cholesterol, Depression.  Mechele Claude, M.D.

## 2023-06-08 DIAGNOSIS — I10 Essential (primary) hypertension: Secondary | ICD-10-CM | POA: Insufficient documentation

## 2023-06-08 DIAGNOSIS — R2689 Other abnormalities of gait and mobility: Secondary | ICD-10-CM | POA: Insufficient documentation

## 2023-06-09 ENCOUNTER — Ambulatory Visit: Payer: Self-pay | Admitting: Family Medicine

## 2023-06-09 NOTE — Telephone Encounter (Signed)
 Chief Complaint: hoarse voice; red spots on roof of mouth  Symptoms: throat discomfort, dry mouth, red spots/streaks on roof of mouth Frequency: dry mouth x 3+ months; hoarse voice x 2 weeks and red spots noticed this morning  Pertinent Negatives: Patient denies facial swelling, tongue swelling, fever, SOB, wheezing, difficulty swallowing, pain in mouth, white spots in mouth, bleeding from mouth, rash.  Disposition: [] ED /[] Urgent Care (no appt availability in office) / [] Appointment(In office/virtual)/ []  [facility]/ [] Home Care/ [] Refused Recommended Disposition /[] [facility]/ [x]  Follow-up with Dentist Additional Notes: Patient reports chronic dry mouth, states she was told by her dentist in the past it could be related to chewing sugar free gum. Patient states today her mouth felt dry and her throat felt discomfort so she checked her mouth and noticed red spots/streaks on the roof of her mouth. Patient reports at 1pm today  she has not drank any water, only liquid today was coffee with creamer. Patient states her throat discomfort and dry mouth improved with drinking a glass of water while on the phone. Patient states she will call her dentist regarding the red spot on the roof of her mouth and the chronic dry mouth. Patient states she would like to wait on making a PCP appointment to see if her symptoms improve with drinking more water this weekend. Patient verbalizes understanding of worsening symptoms or signs/symptoms of allergic reaction and to call back.  Copied from CRM [id number]. Topic: Clinical - Red Word Triage >> Jun 09, 2023 12:59 PM Bascom RAMAN wrote: Red Word that prompted transfer to Nurse Triage: Red splotches in roof of mouth and scratchy sounding voice when talking. Patient is taking 2 new medications: levocetirizine (XYZAL ) 5 MG tablet and fluticasone  (FLONASE ) 50 MCG/ACT nasal spray. Callback number is Reason for Disposition  Red patch in mouth or on  tongue  Answer Assessment - Initial Assessment Questions 1. SYMPTOM: What's the main symptom you're concerned about? (e.g., chapped lips, dry mouth, lump, sores)     Red spots on the roof of mouth, some streaky.  2. ONSET: When did the  mouth streaks/redness  start?     Patient states she noticed it today.  3. PAIN: Is there any pain? If Yes, ask: How bad is it? (Scale: 1-10; mild, moderate, severe)   - MILD (1-3):  doesn't interfere with eating or normal activities   - MODERATE (4-7): interferes with eating some solids and normal activities   - SEVERE (8-10):  excruciating pain, interferes with most normal activities   - SEVERE DYSPHAGIA: can't swallow liquids, drooling     Denies pain.  4. CAUSE: What do you think is causing the symptoms?     Unsure. Patient states she was placed on Xyzal  and Flonase  on 05/29/23.  5. OTHER SYMPTOMS: Do you have any other symptoms? (e.g., fever, sore throat, toothache, swelling)     Dry mouth, hoarse voice, discomfort to throat  Protocols used: Mouth Symptoms-A-AH

## 2023-06-16 ENCOUNTER — Other Ambulatory Visit: Payer: Self-pay

## 2023-06-16 ENCOUNTER — Ambulatory Visit: Payer: Medicare Other | Attending: Family Medicine

## 2023-06-16 DIAGNOSIS — R2681 Unsteadiness on feet: Secondary | ICD-10-CM | POA: Insufficient documentation

## 2023-06-16 DIAGNOSIS — R2689 Other abnormalities of gait and mobility: Secondary | ICD-10-CM | POA: Diagnosis not present

## 2023-06-16 NOTE — Therapy (Signed)
 OUTPATIENT PHYSICAL THERAPY NEURO EVALUATION   Patient Name: Karina Clark MRN: [id number] DOB:[birth year], 78 y.o., female Today's Date: 06/16/2023  REFERRING PROVIDER: Zollie Lowers, MD   END OF SESSION:  PT End of Session - 06/16/23 1102     Visit Number 1    Number of Visits 8    Date for PT Re-Evaluation 08/04/23    PT Start Time 1104    PT Stop Time 1134    PT Time Calculation (min) 30 min    Activity Tolerance Patient tolerated treatment well    Behavior During Therapy WFL for tasks assessed/performed             Past Medical History:  Diagnosis Date   Anxiety    Cataract    Depression    GERD (gastroesophageal reflux disease)    Hyperlipidemia    Hypertension    Osteopenia    Substance abuse (HCC)    DCed cocaine in 1998   Past Surgical History:  Procedure Laterality Date   EYE SURGERY     NASAL SEPTUM SURGERY     TUBAL LIGATION     Patient Active Problem List   Diagnosis Date Noted   Impairment of balance 06/08/2023   Primary hypertension 06/08/2023   Bee sting allergy 01/09/2023   Constipation 02/24/2022   Rectal prolapse 02/24/2022   Degenerative arthritis of right elbow 11/11/2021   Controlled substance agreement signed 09/10/2019   Long term prescription benzodiazepine use 09/10/2019   Depression 06/27/2016   Cannot sleep 12/26/2014   Anxiety 11/08/2011   Allergic rhinitis 11/01/2011   HLD (hyperlipidemia) 11/01/2011   Degeneration macular 11/01/2011   Arthritis, degenerative 11/01/2011   Osteoporosis 11/01/2011   Mixed incontinence 11/01/2011   Avitaminosis D 11/01/2011   Osteopenia 11/01/2011   Acid reflux 12/01/2009    ONSET DATE: approximately 1 year ago  REFERRING DIAG: Impairment of balance   THERAPY DIAG:  Unsteadiness on feet  Rationale for Evaluation and Treatment: Rehabilitation  SUBJECTIVE:                                                                                                                                                                                              SUBJECTIVE STATEMENT: Patient reports that she fell a few days ago while she was on her knees, but she is unsure how she fell. She notes that her memory is bad and she can't remember very well. She is unable to recall why she was on the floor originally, but she thinks it was due to something her cats did.  Pt accompanied by: self  PERTINENT HISTORY: hypertension, arthritis,  osteoporosis, anxiety, memory deficits, and depression  PAIN:  Are you having pain? Yes: NPRS scale: low pain  Pain location: low back Pain description: unable to describe her pain  PRECAUTIONS: Fall  RED FLAGS: None   WEIGHT BEARING RESTRICTIONS: No  FALLS: Has patient fallen in last 6 months? Yes. Number of falls at least 1, but patient is unable to recall any other falls  LIVING ENVIRONMENT: Lives with: lives alone Lives in: House/apartment Stairs: Yes: External: 2-3 steps; none Has following equipment at home: None  PLOF: Independent  PATIENT GOALS: improved balance and safety  OBJECTIVE:  Note: Objective measures were completed at Evaluation unless otherwise noted.  COGNITION: Overall cognitive status: Impaired   SENSATION: Patient reports no numbness or tingling  EDEMA:  No edema observed  MUSCLE TONE: WFL bilaterally  LOWER EXTREMITY ROM: WFL for activities assessed  LOWER EXTREMITY MMT:    MMT Right Eval Left Eval  Hip flexion 4-/5 4/5  Hip extension    Hip abduction    Hip adduction    Hip internal rotation    Hip external rotation    Knee flexion 4/5 4+/5  Knee extension 4+/5 4+/5  Ankle dorsiflexion 4-/5 4-/5  Ankle plantarflexion    Ankle inversion    Ankle eversion    (Blank rows = not tested)  TRANSFERS: Assistive device utilized: None  Sit to stand: Complete Independence Stand to sit:  SBA with uncontrolled descent  GAIT: Gait pattern: step through pattern and decreased stride length Assistive  device utilized: None Level of assistance: SBA Comments: patient drifts to the left while walking  FUNCTIONAL TESTS:  5 times sit to stand: 27.65 seconds without UE support Timed up and go (TUG): 15.08 seconds with close supervision with minA due to LOB when turning                                                                                                                      TREATMENT DATE:     PATIENT EDUCATION: Education details: plan of care, objective findings, prognosis, and goals for physical therapy Person educated: Patient Education method: Explanation Education comprehension: verbalized understanding  HOME EXERCISE PROGRAM:   GOALS: Goals reviewed with patient? Yes  LONG TERM GOALS: Target date: 07/14/23  Patient will be able to complete her HEP with minimal cueing. Baseline:  Goal status: INITIAL  2.  Patient will improve her timed up and go time to 12 seconds or less to reduce her fall risk. Baseline:  Goal status: INITIAL  3.  Patient will improve her 5 times sit to stand time to 13 seconds or less to reduce her fall risk. Baseline:  Goal status: INITIAL  4.  Patient will be able to safely navigate at least 3 steps without a handrail for improved household mobility. Baseline:  Goal status: INITIAL  ASSESSMENT:  CLINICAL IMPRESSION: Patient is a 78 y.o. female who was seen today for physical therapy evaluation and treatment for unsteadiness on her feet. She is at  a high fall risk as evidenced by her objective measures, gait mechanics, and her history of falling. Recommend that she continue with skilled physical therapy to address her impairments to maximize her safety and functional mobility.  OBJECTIVE IMPAIRMENTS: Abnormal gait, decreased balance, decreased cognition, decreased endurance, decreased mobility, difficulty walking, and decreased strength.   ACTIVITY LIMITATIONS: stairs, transfers, and locomotion level  PARTICIPATION LIMITATIONS:  shopping, community activity, and yard work  PERSONAL FACTORS: Age, Time since onset of injury/illness/exacerbation, and 3+ comorbidities: hypertension, arthritis, osteoporosis, anxiety, memory deficits, and depression  are also affecting patient's functional outcome.   REHAB POTENTIAL: Fair    CLINICAL DECISION MAKING: Evolving/moderate complexity  EVALUATION COMPLEXITY: Moderate  PLAN:  PT FREQUENCY: 2x/week  PT DURATION: 4 weeks  PLANNED INTERVENTIONS: 97164- PT Re-evaluation, 97110-Therapeutic exercises, 97530- Therapeutic activity, 97112- Neuromuscular re-education, 97535- Self Care, 02859- Manual therapy, [phone]- Gait training, Patient/Family education, Balance training, and Stair training  PLAN FOR NEXT SESSION: Nustep, balance interventions, and interventions for lower extremity strengthening and power    Lacinda JAYSON Fass, PT 06/16/2023, 1:14 PM

## 2023-06-20 ENCOUNTER — Ambulatory Visit: Payer: Medicare Other | Admitting: *Deleted

## 2023-06-20 ENCOUNTER — Encounter: Payer: Self-pay | Admitting: *Deleted

## 2023-06-20 DIAGNOSIS — R2689 Other abnormalities of gait and mobility: Secondary | ICD-10-CM | POA: Diagnosis not present

## 2023-06-20 DIAGNOSIS — R2681 Unsteadiness on feet: Secondary | ICD-10-CM | POA: Diagnosis not present

## 2023-06-20 NOTE — Therapy (Signed)
 OUTPATIENT PHYSICAL THERAPY NEURO EVALUATION   Patient Name: Karina Clark MRN: [id number] DOB:14-Mar-1946, 78 y.o., female Today's Date: 06/20/2023  REFERRING PROVIDER: Zollie Lowers, MD   END OF SESSION:  PT End of Session - 06/20/23 1105     Visit Number 2    Number of Visits 8    Date for PT Re-Evaluation 08/04/23    PT Start Time 1100    PT Stop Time 1146    PT Time Calculation (min) 46 min             Past Medical History:  Diagnosis Date   Anxiety    Cataract    Depression    GERD (gastroesophageal reflux disease)    Hyperlipidemia    Hypertension    Osteopenia    Substance abuse (HCC)    DCed cocaine in 1998   Past Surgical History:  Procedure Laterality Date   EYE SURGERY     NASAL SEPTUM SURGERY     TUBAL LIGATION     Patient Active Problem List   Diagnosis Date Noted   Impairment of balance 06/08/2023   Primary hypertension 06/08/2023   Bee sting allergy 01/09/2023   Constipation 02/24/2022   Rectal prolapse 02/24/2022   Degenerative arthritis of right elbow 11/11/2021   Controlled substance agreement signed 09/10/2019   Long term prescription benzodiazepine use 09/10/2019   Depression 06/27/2016   Cannot sleep 12/26/2014   Anxiety 11/08/2011   Allergic rhinitis 11/01/2011   HLD (hyperlipidemia) 11/01/2011   Degeneration macular 11/01/2011   Arthritis, degenerative 11/01/2011   Osteoporosis 11/01/2011   Mixed incontinence 11/01/2011   Avitaminosis D 11/01/2011   Osteopenia 11/01/2011   Acid reflux 12/01/2009    ONSET DATE: approximately 1 year ago  REFERRING DIAG: Impairment of balance   THERAPY DIAG:  Unsteadiness on feet  Rationale for Evaluation and Treatment: Rehabilitation  SUBJECTIVE:                                                                                                                                                                                             SUBJECTIVE STATEMENT: Patient reports doing okay  today, but RT elbow  is hurting. Pt accompanied by: self  PERTINENT HISTORY: hypertension, arthritis, osteoporosis, anxiety, memory deficits, and depression  PAIN:  Are you having pain? Yes: NPRS scale: low pain  Pain location: low back Pain description: unable to describe her pain  PRECAUTIONS: Fall  RED FLAGS: None   WEIGHT BEARING RESTRICTIONS: No  FALLS: Has patient fallen in last 6 months? Yes. Number of falls at least 1, but patient is unable to recall any other  falls  LIVING ENVIRONMENT: Lives with: lives alone Lives in: House/apartment Stairs: Yes: External: 2-3 steps; none Has following equipment at home: None  PLOF: Independent  PATIENT GOALS: improved balance and safety  OBJECTIVE:  Note: Objective measures were completed at Evaluation unless otherwise noted.  COGNITION: Overall cognitive status: Impaired   SENSATION: Patient reports no numbness or tingling  EDEMA:  No edema observed  MUSCLE TONE: WFL bilaterally  LOWER EXTREMITY ROM: WFL for activities assessed  LOWER EXTREMITY MMT:    MMT Right Eval Left Eval  Hip flexion 4-/5 4/5  Hip extension    Hip abduction    Hip adduction    Hip internal rotation    Hip external rotation    Knee flexion 4/5 4+/5  Knee extension 4+/5 4+/5  Ankle dorsiflexion 4-/5 4-/5  Ankle plantarflexion    Ankle inversion    Ankle eversion    (Blank rows = not tested)  TRANSFERS: Assistive device utilized: None  Sit to stand: Complete Independence Stand to sit:  SBA with uncontrolled descent  GAIT: Gait pattern: step through pattern and decreased stride length Assistive device utilized: None Level of assistance: SBA Comments: patient drifts to the left while walking  FUNCTIONAL TESTS:  5 times sit to stand: 27.65 seconds without UE support Timed up and go (TUG): 15.08 seconds with close supervision with minA due to LOB when turning                                                                                                                       TREATMENT DATE:      06-20-23                                    EXERCISE LOG  Balance/ LE's  Exercise Repetitions and Resistance Comments  Nustep L2  x  11  mins   LE's and LT UE only due to RT arm pain  Step up 6 in 3x10   Toe taps 6in 3x10 alternating  balance CGA  Rocker board DF/PF x 2 mins, balance x  1 min but very challenging   LAQ's 2#  3x10 Bil.   HS curl RED band  2x10  Bil   Hip ABD 3x 10 slowly    Blank cell = exercise not performed today    PATIENT EDUCATION: Education details: plan of care, objective findings, prognosis, and goals for physical therapy Person educated: Patient Education method: Explanation Education comprehension: verbalized understanding  HOME EXERCISE PROGRAM:   GOALS: Goals reviewed with patient? Yes  LONG TERM GOALS: Target date: 07/14/23  Patient will be able to complete her HEP with minimal cueing. Baseline:  Goal status: INITIAL  2.  Patient will improve her timed up and go time to 12 seconds or less to reduce her fall risk. Baseline:  Goal status: INITIAL  3.  Patient will improve her 5 times sit to stand time  to 13 seconds or less to reduce her fall risk. Baseline:  Goal status: INITIAL  4.  Patient will be able to safely navigate at least 3 steps without a handrail for improved household mobility. Baseline:  Goal status: INITIAL  ASSESSMENT:  CLINICAL IMPRESSION: Patient arrived today doing fairly well, but c/o RT elbow pain today. Rx focused on Balance as well as standing and sitting LE strengthening exs. Pt did great with Therex today with mainly mm fatigue end of session. Pt had difficulty with balance on rocker board.     OBJECTIVE IMPAIRMENTS: Abnormal gait, decreased balance, decreased cognition, decreased endurance, decreased mobility, difficulty walking, and decreased strength.   ACTIVITY LIMITATIONS: stairs, transfers, and locomotion level  PARTICIPATION LIMITATIONS:  shopping, community activity, and yard work  PERSONAL FACTORS: Age, Time since onset of injury/illness/exacerbation, and 3+ comorbidities: hypertension, arthritis, osteoporosis, anxiety, memory deficits, and depression  are also affecting patient's functional outcome.   REHAB POTENTIAL: Fair    CLINICAL DECISION MAKING: Evolving/moderate complexity  EVALUATION COMPLEXITY: Moderate  PLAN:  PT FREQUENCY: 2x/week  PT DURATION: 4 weeks  PLANNED INTERVENTIONS: 97164- PT Re-evaluation, 97110-Therapeutic exercises, 97530- Therapeutic activity, 97112- Neuromuscular re-education, 97535- Self Care, 02859- Manual therapy, [phone]- Gait training, Patient/Family education, Balance training, and Stair training  PLAN FOR NEXT SESSION: Nustep, balance interventions, and interventions for lower extremity strengthening and power    Heinrich Fertig,CHRIS, PTA 06/20/2023, 11:52 AM

## 2023-06-22 ENCOUNTER — Encounter: Payer: Medicare Other | Admitting: *Deleted

## 2023-06-23 ENCOUNTER — Ambulatory Visit: Payer: Medicare Other

## 2023-06-23 DIAGNOSIS — R2681 Unsteadiness on feet: Secondary | ICD-10-CM | POA: Diagnosis not present

## 2023-06-23 DIAGNOSIS — R2689 Other abnormalities of gait and mobility: Secondary | ICD-10-CM | POA: Diagnosis not present

## 2023-06-23 NOTE — Therapy (Signed)
OUTPATIENT PHYSICAL THERAPY NEURO EVALUATION   Patient Name: Karina Clark MRN: [id number] DOB:[birth year], 78 y.o., female Today's Date: 06/23/2023  REFERRING PROVIDER: Mechele Claude, MD   END OF SESSION:  PT End of Session - 06/23/23 1107     Visit Number 3    Number of Visits 8    Date for PT Re-Evaluation 08/04/23    PT Start Time 1100    PT Stop Time 1140    PT Time Calculation (min) 40 min    Activity Tolerance Patient tolerated treatment well    Behavior During Therapy WFL for tasks assessed/performed              Past Medical History:  Diagnosis Date   Anxiety    Cataract    Depression    GERD (gastroesophageal reflux disease)    Hyperlipidemia    Hypertension    Osteopenia    Substance abuse (HCC)    DCed cocaine in 1998   Past Surgical History:  Procedure Laterality Date   EYE SURGERY     NASAL SEPTUM SURGERY     TUBAL LIGATION     Patient Active Problem List   Diagnosis Date Noted   Impairment of balance 06/08/2023   Primary hypertension 06/08/2023   Bee sting allergy 01/09/2023   Constipation 02/24/2022   Rectal prolapse 02/24/2022   Degenerative arthritis of right elbow 11/11/2021   Controlled substance agreement signed 09/10/2019   Long term prescription benzodiazepine use 09/10/2019   Depression 06/27/2016   Cannot sleep 12/26/2014   Anxiety 11/08/2011   Allergic rhinitis 11/01/2011   HLD (hyperlipidemia) 11/01/2011   Degeneration macular 11/01/2011   Arthritis, degenerative 11/01/2011   Osteoporosis 11/01/2011   Mixed incontinence 11/01/2011   Avitaminosis D 11/01/2011   Osteopenia 11/01/2011   Acid reflux 12/01/2009    ONSET DATE: approximately 1 year ago  REFERRING DIAG: Impairment of balance   THERAPY DIAG:  Unsteadiness on feet  Rationale for Evaluation and Treatment: Rehabilitation  SUBJECTIVE:                                                                                                                                                                                              SUBJECTIVE STATEMENT: Patient reports that she fell on ice Wednesday afternoon while she was going outside to try and go to church. Her feet slid out from under her and caused her to land on her back.  Pt accompanied by: self  PERTINENT HISTORY: hypertension, arthritis, osteoporosis, anxiety, memory deficits, and depression  PAIN:  Are you having pain? Yes: NPRS scale: "no pain"  Pain location: low  back Pain description: unable to describe her pain  PRECAUTIONS: Fall  RED FLAGS: None   WEIGHT BEARING RESTRICTIONS: No  FALLS: Has patient fallen in last 6 months? Yes. Number of falls at least 1, but patient is unable to recall any other falls  LIVING ENVIRONMENT: Lives with: lives alone Lives in: House/apartment Stairs: Yes: External: 2-3 steps; none Has following equipment at home: None  PLOF: Independent  PATIENT GOALS: improved balance and safety  OBJECTIVE:  Note: Objective measures were completed at Evaluation unless otherwise noted.  COGNITION: Overall cognitive status: Impaired   SENSATION: Patient reports no numbness or tingling  EDEMA:  No edema observed  MUSCLE TONE: WFL bilaterally  LOWER EXTREMITY ROM: WFL for activities assessed  LOWER EXTREMITY MMT:    MMT Right Eval Left Eval  Hip flexion 4-/5 4/5  Hip extension    Hip abduction    Hip adduction    Hip internal rotation    Hip external rotation    Knee flexion 4/5 4+/5  Knee extension 4+/5 4+/5  Ankle dorsiflexion 4-/5 4-/5  Ankle plantarflexion    Ankle inversion    Ankle eversion    (Blank rows = not tested)  TRANSFERS: Assistive device utilized: None  Sit to stand: Complete Independence Stand to sit:  SBA with uncontrolled descent  GAIT: Gait pattern: step through pattern and decreased stride length Assistive device utilized: None Level of assistance: SBA Comments: patient drifts to the left while  walking  FUNCTIONAL TESTS:  5 times sit to stand: 27.65 seconds without UE support Timed up and go (TUG): 15.08 seconds with close supervision with minA due to LOB when turning                                                                                                                      TREATMENT DATE:                                         06/23/23 EXERCISE LOG  Exercise Repetitions and Resistance Comments  Nustep  L3 x 15 minutes   LAQ 3# x 3 minutes    Rocker board  4 minutes BUE support  Sit to stand  20 reps  Without UE support  Marching on foam  2 minutes  Without UE support  Static stance on foam 2 minutes Intermittent minA for safety due to LOB       Blank cell = exercise not performed today                                06-20-23 EXERCISE LOG  Balance/ LE's  Exercise Repetitions and Resistance Comments  Nustep L2  x  11  mins   LE's and LT UE only due to RT arm pain  Step up 6 in 3x10   Toe taps 6in  3x10 alternating  balance CGA  Rocker board DF/PF x 2 mins, balance x  1 min but very challenging   LAQ's 2#  3x10 Bil.   HS curl RED band  2x10  Bil   Hip ABD 3x 10 slowly    Blank cell = exercise not performed today    PATIENT EDUCATION: Education details: household modifications for safety Person educated: Patient Education method: Explanation Education comprehension: verbalized understanding  HOME EXERCISE PROGRAM:   GOALS: Goals reviewed with patient? Yes  LONG TERM GOALS: Target date: 07/14/23  Patient will be able to complete her HEP with minimal cueing. Baseline:  Goal status: INITIAL  2.  Patient will improve her timed up and go time to 12 seconds or less to reduce her fall risk. Baseline:  Goal status: INITIAL  3.  Patient will improve her 5 times sit to stand time to 13 seconds or less to reduce her fall risk. Baseline:  Goal status: INITIAL  4.  Patient will be able to safely navigate at least 3 steps without a handrail for improved  household mobility. Baseline:  Goal status: INITIAL  ASSESSMENT:  CLINICAL IMPRESSION: Patient was progressed with multiple new and familiar interventions for improved lower extremity safety and strength needed to reduce her risk of falling. She required minimal cueing with today's new interventions for proper exercise performance. She was educated on household modifications to reduce her risk of falling at home. She reported feeling understanding. She reported feeling good upon the conclusion of treatment. She continues to require skilled physical therapy to address her remaining impairments to maximize her safety and functional mobility.   OBJECTIVE IMPAIRMENTS: Abnormal gait, decreased balance, decreased cognition, decreased endurance, decreased mobility, difficulty walking, and decreased strength.   ACTIVITY LIMITATIONS: stairs, transfers, and locomotion level  PARTICIPATION LIMITATIONS: shopping, community activity, and yard work  PERSONAL FACTORS: Age, Time since onset of injury/illness/exacerbation, and 3+ comorbidities: hypertension, arthritis, osteoporosis, anxiety, memory deficits, and depression  are also affecting patient's functional outcome.   REHAB POTENTIAL: Fair    CLINICAL DECISION MAKING: Evolving/moderate complexity  EVALUATION COMPLEXITY: Moderate  PLAN:  PT FREQUENCY: 2x/week  PT DURATION: 4 weeks  PLANNED INTERVENTIONS: 97164- PT Re-evaluation, 97110-Therapeutic exercises, 97530- Therapeutic activity, 97112- Neuromuscular re-education, 97535- Self Care, 16109- Manual therapy, [phone]- Gait training, Patient/Family education, Balance training, and Stair training  PLAN FOR NEXT SESSION: Nustep, balance interventions, and interventions for lower extremity strengthening and power    Granville Lewis, PT 06/23/2023, 11:44 AM

## 2023-06-28 ENCOUNTER — Telehealth: Payer: Self-pay

## 2023-06-28 NOTE — Telephone Encounter (Signed)
Patient has been scheduled. LS

## 2023-06-28 NOTE — Telephone Encounter (Signed)
I can evaluate for that here.

## 2023-06-28 NOTE — Telephone Encounter (Signed)
Copied from CRM [id number]. Topic: Clinical - Medical Advice >> Jun 28, 2023  1:46 PM Maxwell Marion wrote: Reason for CRM: Patient's granddaughter Charlynne Pander (on [place]) called in, wanting to speak with Dr. Darlyn Read or get a message to Dr. Darlyn Read. They are concerned the patient is in early stages of dementia and is concerned. Was wanting to know what should they do or if the patient can be referred somewhere in [place] to test her brain function. Church members has noticed certain behaviors and the patient has been very forgetful. Charlynne Pander would like a call back please, number is [phone].

## 2023-06-29 ENCOUNTER — Other Ambulatory Visit: Payer: Self-pay | Admitting: Family Medicine

## 2023-06-30 ENCOUNTER — Encounter: Payer: Self-pay | Admitting: Family Medicine

## 2023-06-30 ENCOUNTER — Ambulatory Visit (INDEPENDENT_AMBULATORY_CARE_PROVIDER_SITE_OTHER): Payer: Medicare Other | Admitting: Family Medicine

## 2023-06-30 VITALS — BP 134/65 | HR 69 | Temp 97.4°F | Ht <= 58 in | Wt 95.0 lb

## 2023-06-30 DIAGNOSIS — E782 Mixed hyperlipidemia: Secondary | ICD-10-CM | POA: Diagnosis not present

## 2023-06-30 DIAGNOSIS — I10 Essential (primary) hypertension: Secondary | ICD-10-CM

## 2023-06-30 DIAGNOSIS — M25521 Pain in right elbow: Secondary | ICD-10-CM | POA: Diagnosis not present

## 2023-06-30 DIAGNOSIS — G3184 Mild cognitive impairment, so stated: Secondary | ICD-10-CM

## 2023-06-30 MED ORDER — ATORVASTATIN CALCIUM 20 MG PO TABS: 20.0000 mg | ORAL_TABLET | Freq: Every day | ORAL | 3 refills | Status: DC

## 2023-06-30 MED ORDER — DICLOFENAC SODIUM 1 % EX GEL
2.0000 g | Freq: Four times a day (QID) | CUTANEOUS | 3 refills | Status: AC | PRN
Start: 2023-06-30 — End: ?

## 2023-06-30 MED ORDER — MELOXICAM 7.5 MG PO TABS: 7.5000 mg | ORAL_TABLET | Freq: Every day | ORAL | 1 refills | Status: DC

## 2023-06-30 NOTE — Progress Notes (Signed)
Subjective:  Patient ID: Karina Clark, female    DOB: 1946-02-21  Age: 78 y.o. MRN: [id number]  CC: Memory Loss and skin (Hard place on left pointer finger. Wont heal. No pain. )   HPI Karina Clark presents for concern that she I developing dementia. Has begun repeating herself and is more forgetful. She tends to worry and is chronically anxious. Her daughter relates her having looked all over the church for her billfold. She then found it in the glove box of her car.    Mini-Cog - 06/30/23 1018     Normal clock drawing test? yes                 06/30/2023   10:11 AM 08/07/2017    2:40 PM 11/17/2015   11:38 AM  MMSE - Mini Mental State Exam  Orientation to time 5 5 5   Orientation to Place 5 5 5   Registration 2 3 3   Attention/ Calculation 4 5 5   Recall 2 3 3   Language- name 2 objects 2 2 2   Language- repeat 1 1 1   Language- follow 3 step command 3 3 3   Language- read & follow direction 1 1 1   Write a sentence 1 1 1   Copy design 1 1 1   Total score 27 30 30         06/30/2023   10:25 AM 06/05/2023   11:30 AM 06/05/2023   11:14 AM  Depression screen PHQ 2/9  Decreased Interest 1 1 0  Down, Depressed, Hopeless 1 1 0  PHQ - 2 Score 2 2 0  Altered sleeping 1 2   Tired, decreased energy 1 2   Change in appetite 1 1   Feeling bad or failure about yourself  0 1   Trouble concentrating 1 1   Moving slowly or fidgety/restless 0 0   Suicidal thoughts 0 0   PHQ-9 Score 6 9   Difficult doing work/chores Somewhat difficult Somewhat difficult     History Karina Clark has a past medical history of Anxiety, Cataract, Depression, GERD (gastroesophageal reflux disease), Hyperlipidemia, Hypertension, Osteopenia, and Substance abuse (HCC).   She has a past surgical history that includes Tubal ligation; Eye surgery; and Nasal septum surgery.   Her family history includes Cancer in her brother; Dementia in her father; Hip fracture in her mother; Hyperlipidemia in her mother;  Hypertension in her daughter and father; Neuropathy in her brother; Thyroid disease in her mother.She reports that she quit smoking about 28 years ago. Her smoking use included cigarettes. She started smoking about 54 years ago. She has a 12.9 pack-year smoking history. She has never used smokeless tobacco. She reports that she does not drink alcohol and does not use drugs.    ROS Review of Systems  Constitutional: Negative.   HENT: Negative.    Eyes:  Negative for visual disturbance.  Respiratory:  Negative for shortness of breath.   Cardiovascular:  Negative for chest pain.  Gastrointestinal:  Negative for abdominal pain.  Musculoskeletal:  Negative for arthralgias.    Objective:  BP 134/65   Pulse 69   Temp (!) 97.4 F (36.3 C)   Ht 2' (0.61 m)   Wt 95 lb (43.1 kg)   SpO2 99%   BMI 115.96 kg/m   BP Readings from Last 3 Encounters:  06/30/23 134/65  06/05/23 (!) 158/69  05/29/23 130/70    Wt Readings from Last 3 Encounters:  06/30/23 95 lb (43.1 kg)  06/05/23 94 lb  12.8 oz (43 kg)  05/29/23 95 lb 3.2 oz (43.2 kg)     Physical Exam Constitutional:      General: She is not in acute distress.    Appearance: She is well-developed.  Cardiovascular:     Rate and Rhythm: Normal rate and regular rhythm.  Pulmonary:     Breath sounds: Normal breath sounds.  Musculoskeletal:        General: Normal range of motion.  Skin:    General: Skin is warm and dry.  Neurological:     Mental Status: She is alert and oriented to person, place, and time.       Assessment & Plan:   Karina Clark "Karina Clark" was seen today for memory loss and skin.  Diagnoses and all orders for this visit:  Primary hypertension -     Lipid panel -     CBC with Differential/Platelet -     CMP14+EGFR  Mixed hyperlipidemia -     Lipid panel  Right elbow pain -     diclofenac Sodium (VOLTAREN) 1 % GEL; Apply 2 g topically 4 (four) times daily as needed (right shoulder and elbow pain).  Mild  cognitive impairment  Other orders -     atorvastatin (LIPITOR) 20 MG tablet; Take 1 tablet (20 mg total) by mouth daily. for cholesterol. -     meloxicam (MOBIC) 7.5 MG tablet; Take 1 tablet (7.5 mg total) by mouth daily.       I have changed Karina Clark. Clark "Karina Clark"'s meloxicam. I am also having her maintain her aspirin EC, Vitamin D (Cholecalciferol), Omega-3, ketoconazole, pantoprazole, zinc gluconate, alendronate, levocetirizine, fluticasone, olmesartan, venlafaxine XR, traZODone, meloxicam, atorvastatin, and diclofenac Sodium.  Allergies as of 06/30/2023       Reactions   Seroquel [quetiapine]    Drowsiness the next morning   Mycelex [clotrimazole] Other (See Comments)   Worsened GERD / nausea   Naproxen Other (See Comments)   "Makes my stomach burn"   Sulfamethoxazole-trimethoprim Nausea And Vomiting        Medication List        Accurate as of June 30, 2023  7:27 PM. If you have any questions, ask your nurse or doctor.          alendronate 70 MG tablet Commonly known as: FOSAMAX Take 1 tablet (70 mg total) by mouth every 7 (seven) days. Take with a full glass of water on an empty stomach.   aspirin EC 81 MG tablet Take 81 mg by mouth daily.   atorvastatin 20 MG tablet Commonly known as: LIPITOR Take 1 tablet (20 mg total) by mouth daily. for cholesterol.   diclofenac Sodium 1 % Gel Commonly known as: Voltaren Apply 2 g topically 4 (four) times daily as needed (right shoulder and elbow pain).   fluticasone 50 MCG/ACT nasal spray Commonly known as: FLONASE Place 2 sprays into both nostrils daily.   ketoconazole 2 % cream Commonly known as: NIZORAL Apply 1 Application topically daily.   levocetirizine 5 MG tablet Commonly known as: XYZAL Take 1 tablet (5 mg total) by mouth every evening.   meloxicam 7.5 MG tablet Commonly known as: MOBIC TAKE 1 TABLET BY MOUTH ONCE DAILY FOR MUSCLE AND JOINT PAIN What changed: Another medication with the  same name was added. Make sure you understand how and when to take each. Changed by: Burhanuddin Kohlmann   meloxicam 7.5 MG tablet Commonly known as: MOBIC Take 1 tablet (7.5 mg total) by mouth daily.  What changed: You were already taking a medication with the same name, and this prescription was added. Make sure you understand how and when to take each. Changed by: Brithany Whitworth   olmesartan 20 MG tablet Commonly known as: BENICAR Take 1 tablet (20 mg total) by mouth daily. for blood pressure   Omega-3 1000 MG Caps Take 3 g by mouth daily.   pantoprazole 40 MG tablet Commonly known as: PROTONIX TAKE 1 TABLET BY MOUTH ONCE DAILY FOR STOMACH   traZODone 150 MG tablet Commonly known as: DESYREL Use from 1/3 to 1 tablet nightly as needed for sleep.   venlafaxine XR 150 MG 24 hr capsule Commonly known as: EFFEXOR-XR Take 1 capsule (150 mg total) by mouth at bedtime.   Vitamin D (Cholecalciferol) 25 MCG (1000 UT) Caps Take 2,000 Units by mouth daily.   zinc gluconate 50 MG tablet Take 1 tablet (50 mg total) by mouth daily.         Follow-up: Return in about 6 months (around 12/28/2023).  Mechele Claude, M.D.

## 2023-07-11 ENCOUNTER — Ambulatory Visit: Payer: Medicare Other | Attending: Family Medicine

## 2023-07-11 DIAGNOSIS — R2681 Unsteadiness on feet: Secondary | ICD-10-CM | POA: Diagnosis not present

## 2023-07-11 NOTE — Therapy (Signed)
 OUTPATIENT PHYSICAL THERAPY NEURO TREATMENT   Patient Name: Karina Clark MRN: [id number] DOB:1945-12-07, 78 y.o., female Today's Date: 07/11/2023  REFERRING PROVIDER: Zollie Lowers, MD   END OF SESSION:  PT End of Session - 07/11/23 1106     Visit Number 4    Number of Visits 8    Date for PT Re-Evaluation 08/04/23    PT Start Time 1100    PT Stop Time 1144    PT Time Calculation (min) 44 min    Activity Tolerance Patient tolerated treatment well    Behavior During Therapy WFL for tasks assessed/performed               Past Medical History:  Diagnosis Date   Anxiety    Cataract    Depression    GERD (gastroesophageal reflux disease)    Hyperlipidemia    Hypertension    Osteopenia    Substance abuse (HCC)    DCed cocaine in 1998   Past Surgical History:  Procedure Laterality Date   EYE SURGERY     NASAL SEPTUM SURGERY     TUBAL LIGATION     Patient Active Problem List   Diagnosis Date Noted   Impairment of balance 06/08/2023   Primary hypertension 06/08/2023   Bee sting allergy 01/09/2023   Constipation 02/24/2022   Rectal prolapse 02/24/2022   Degenerative arthritis of right elbow 11/11/2021   Controlled substance agreement signed 09/10/2019   Long term prescription benzodiazepine use 09/10/2019   Depression 06/27/2016   Cannot sleep 12/26/2014   Anxiety 11/08/2011   Allergic rhinitis 11/01/2011   HLD (hyperlipidemia) 11/01/2011   Degeneration macular 11/01/2011   Arthritis, degenerative 11/01/2011   Osteoporosis 11/01/2011   Mixed incontinence 11/01/2011   Avitaminosis D 11/01/2011   Osteopenia 11/01/2011   Acid reflux 12/01/2009    ONSET DATE: approximately 1 year ago  REFERRING DIAG: Impairment of balance   THERAPY DIAG:  Unsteadiness on feet  Rationale for Evaluation and Treatment: Rehabilitation  SUBJECTIVE:                                                                                                                                                                                              SUBJECTIVE STATEMENT: Patient reports that she has not fallen any since her last appointment. She has someone coming to put handrails on her deck today.  Pt accompanied by: self  PERTINENT HISTORY: hypertension, arthritis, osteoporosis, anxiety, memory deficits, and depression  PAIN:  Are you having pain? Yes: NPRS scale: no pain  Pain location: low back Pain description: unable to describe her pain  PRECAUTIONS: Fall  RED FLAGS: None   WEIGHT BEARING RESTRICTIONS: No  FALLS: Has patient fallen in last 6 months? Yes. Number of falls at least 1, but patient is unable to recall any other falls  LIVING ENVIRONMENT: Lives with: lives alone Lives in: House/apartment Stairs: Yes: External: 2-3 steps; none Has following equipment at home: None  PLOF: Independent  PATIENT GOALS: improved balance and safety  OBJECTIVE:  Note: Objective measures were completed at Evaluation unless otherwise noted.  COGNITION: Overall cognitive status: Impaired   SENSATION: Patient reports no numbness or tingling  EDEMA:  No edema observed  MUSCLE TONE: WFL bilaterally  LOWER EXTREMITY ROM: WFL for activities assessed  LOWER EXTREMITY MMT:    MMT Right Eval Left Eval  Hip flexion 4-/5 4/5  Hip extension    Hip abduction    Hip adduction    Hip internal rotation    Hip external rotation    Knee flexion 4/5 4+/5  Knee extension 4+/5 4+/5  Ankle dorsiflexion 4-/5 4-/5  Ankle plantarflexion    Ankle inversion    Ankle eversion    (Blank rows = not tested)  TRANSFERS: Assistive device utilized: None  Sit to stand: Complete Independence Stand to sit:  SBA with uncontrolled descent  GAIT: Gait pattern: step through pattern and decreased stride length Assistive device utilized: None Level of assistance: SBA Comments: patient drifts to the left while walking  FUNCTIONAL TESTS:  5 times sit to stand: 27.65  seconds without UE support Timed up and go (TUG): 15.08 seconds with close supervision with minA due to LOB when turning                                                                                                                      TREATMENT DATE:                                         07/11/23 EXERCISE LOG  Exercise Repetitions and Resistance Comments  Nustep  L4 x 8 minutes    Rocker board  5 minutes   Seated hip ADD isometric  3 minutes w/ 5 second hold    Seated clams  Green t-band x 3 minutes    LAQ Green t-band @ ankles x 3 minutes    Standing hip ABD 1.5 minutes each     Blank cell = exercise not performed today                                    06/23/23 EXERCISE LOG  Exercise Repetitions and Resistance Comments  Nustep  L3 x 15 minutes   LAQ 3# x 3 minutes    Rocker board  4 minutes BUE support  Sit to stand  20 reps  Without UE support  Marching on foam  2 minutes  Without UE support  Static stance on foam 2 minutes Intermittent minA for safety due to LOB       Blank cell = exercise not performed today                                06-20-23 EXERCISE LOG  Balance/ LE's  Exercise Repetitions and Resistance Comments  Nustep L2  x  11  mins   LE's and LT UE only due to RT arm pain  Step up 6 in 3x10   Toe taps 6in 3x10 alternating  balance CGA  Rocker board DF/PF x 2 mins, balance x  1 min but very challenging   LAQ's 2#  3x10 Bil.   HS curl RED band  2x10  Bil   Hip ABD 3x 10 slowly    Blank cell = exercise not performed today    PATIENT EDUCATION: Education details: HEP, benefits of exercise, and plan of care Person educated: Patient Education method: Explanation Education comprehension: verbalized understanding  HOME EXERCISE PROGRAM: [phone]  GOALS: Goals reviewed with patient? Yes  LONG TERM GOALS: Target date: 07/14/23  Patient will be able to complete her HEP with minimal cueing. Baseline:  Goal status: INITIAL  2.  Patient will improve her  timed up and go time to 12 seconds or less to reduce her fall risk. Baseline:  Goal status: INITIAL  3.  Patient will improve her 5 times sit to stand time to 13 seconds or less to reduce her fall risk. Baseline:  Goal status: INITIAL  4.  Patient will be able to safely navigate at least 3 steps without a handrail for improved household mobility. Baseline:  Goal status: INITIAL  ASSESSMENT:  CLINICAL IMPRESSION: Today's treatment focused on updating her home exercise program. She required minimal cueing with standing hip abduction to prevent lateral trunk mobility to isolate hip abductor engagement. She was provided a handout and she felt comfortable performing these interventions at home. She reported feeling good upon the conclusion of treatment. She continues to require skilled physical therapy to address her remaining impairments to maximize her safety and functional mobility.   OBJECTIVE IMPAIRMENTS: Abnormal gait, decreased balance, decreased cognition, decreased endurance, decreased mobility, difficulty walking, and decreased strength.   ACTIVITY LIMITATIONS: stairs, transfers, and locomotion level  PARTICIPATION LIMITATIONS: shopping, community activity, and yard work  PERSONAL FACTORS: Age, Time since onset of injury/illness/exacerbation, and 3+ comorbidities: hypertension, arthritis, osteoporosis, anxiety, memory deficits, and depression  are also affecting patient's functional outcome.   REHAB POTENTIAL: Fair    CLINICAL DECISION MAKING: Evolving/moderate complexity  EVALUATION COMPLEXITY: Moderate  PLAN:  PT FREQUENCY: 2x/week  PT DURATION: 4 weeks  PLANNED INTERVENTIONS: 97164- PT Re-evaluation, 97110-Therapeutic exercises, 97530- Therapeutic activity, 97112- Neuromuscular re-education, 97535- Self Care, 02859- Manual therapy, [phone]- Gait training, Patient/Family education, Balance training, and Stair training  PLAN FOR NEXT SESSION: Nustep, balance interventions,  and interventions for lower extremity strengthening and power    Lacinda JAYSON Fass, PT 07/11/2023, 12:17 PM

## 2023-07-11 NOTE — Progress Notes (Signed)
Tried to call and schedule but vm not set up. Unable to lm. LS

## 2023-07-13 ENCOUNTER — Ambulatory Visit: Payer: Medicare Other

## 2023-07-13 DIAGNOSIS — R2681 Unsteadiness on feet: Secondary | ICD-10-CM | POA: Diagnosis not present

## 2023-07-13 NOTE — Therapy (Signed)
 OUTPATIENT PHYSICAL THERAPY NEURO TREATMENT   Patient Name: Karina Clark MRN: [id number] DOB:[birth year], 78 y.o., female Today's Date: 07/13/2023  REFERRING PROVIDER: Zollie Lowers, MD   END OF SESSION:  PT End of Session - 07/13/23 1100     Visit Number 5    Number of Visits 8    Date for PT Re-Evaluation 08/04/23    PT Start Time 1100    PT Stop Time 1150    PT Time Calculation (min) 50 min    Activity Tolerance Patient tolerated treatment well    Behavior During Therapy WFL for tasks assessed/performed                Past Medical History:  Diagnosis Date   Anxiety    Cataract    Depression    GERD (gastroesophageal reflux disease)    Hyperlipidemia    Hypertension    Osteopenia    Substance abuse (HCC)    DCed cocaine in 1998   Past Surgical History:  Procedure Laterality Date   EYE SURGERY     NASAL SEPTUM SURGERY     TUBAL LIGATION     Patient Active Problem List   Diagnosis Date Noted   Impairment of balance 06/08/2023   Primary hypertension 06/08/2023   Bee sting allergy 01/09/2023   Constipation 02/24/2022   Rectal prolapse 02/24/2022   Degenerative arthritis of right elbow 11/11/2021   Controlled substance agreement signed 09/10/2019   Long term prescription benzodiazepine use 09/10/2019   Depression 06/27/2016   Cannot sleep 12/26/2014   Anxiety 11/08/2011   Allergic rhinitis 11/01/2011   HLD (hyperlipidemia) 11/01/2011   Degeneration macular 11/01/2011   Arthritis, degenerative 11/01/2011   Osteoporosis 11/01/2011   Mixed incontinence 11/01/2011   Avitaminosis D 11/01/2011   Osteopenia 11/01/2011   Acid reflux 12/01/2009    ONSET DATE: approximately 1 year ago  REFERRING DIAG: Impairment of balance   THERAPY DIAG:  Unsteadiness on feet  Rationale for Evaluation and Treatment: Rehabilitation  SUBJECTIVE:                                                                                                                                                                                              SUBJECTIVE STATEMENT: She has been doing her HEP at home which feels good. Pt accompanied by: self  PERTINENT HISTORY: hypertension, arthritis, osteoporosis, anxiety, memory deficits, and depression  PAIN:  Are you having pain? Yes: NPRS scale: no pain  Pain location: low back Pain description: unable to describe her pain  PRECAUTIONS: Fall  RED FLAGS: None   WEIGHT BEARING RESTRICTIONS: No  FALLS:  Has patient fallen in last 6 months? Yes. Number of falls at least 1, but patient is unable to recall any other falls  LIVING ENVIRONMENT: Lives with: lives alone Lives in: House/apartment Stairs: Yes: External: 2-3 steps; none Has following equipment at home: None  PLOF: Independent  PATIENT GOALS: improved balance and safety  OBJECTIVE:  Note: Objective measures were completed at Evaluation unless otherwise noted.  COGNITION: Overall cognitive status: Impaired   SENSATION: Patient reports no numbness or tingling  EDEMA:  No edema observed  MUSCLE TONE: WFL bilaterally  LOWER EXTREMITY ROM: WFL for activities assessed  LOWER EXTREMITY MMT:    MMT Right Eval Left Eval  Hip flexion 4-/5 4/5  Hip extension    Hip abduction    Hip adduction    Hip internal rotation    Hip external rotation    Knee flexion 4/5 4+/5  Knee extension 4+/5 4+/5  Ankle dorsiflexion 4-/5 4-/5  Ankle plantarflexion    Ankle inversion    Ankle eversion    (Blank rows = not tested)  TRANSFERS: Assistive device utilized: None  Sit to stand: Complete Independence Stand to sit:  SBA with uncontrolled descent  GAIT: Gait pattern: step through pattern and decreased stride length Assistive device utilized: None Level of assistance: SBA Comments: patient drifts to the left while walking  FUNCTIONAL TESTS:  5 times sit to stand: 27.65 seconds without UE support Timed up and go (TUG): 15.08 seconds with close  supervision with minA due to LOB when turning                                                                                                                      TREATMENT DATE:                                         07/13/23 EXERCISE LOG  Exercise Repetitions and Resistance Comments  Nustep  L4 x 11 minutes   Standing heel/toe raise 3.5 minutes   Standing HS curl  1.5 minutes each    Side stepping on foam  3.5 minutes BUE support  Tandem walking on foam  6 laps BUE support  Narrow BOS on foam  3 minutes  With therapist perturbations   Blank cell = exercise not performed today                                    07/11/23 EXERCISE LOG  Exercise Repetitions and Resistance Comments  Nustep  L4 x 8 minutes    Rocker board  5 minutes   Seated hip ADD isometric  3 minutes w/ 5 second hold    Seated clams  Green t-band x 3 minutes    LAQ Green t-band @ ankles x 3 minutes    Standing hip ABD 1.5 minutes each  Blank cell = exercise not performed today                                    06/23/23 EXERCISE LOG  Exercise Repetitions and Resistance Comments  Nustep  L3 x 15 minutes   LAQ 3# x 3 minutes    Rocker board  4 minutes BUE support  Sit to stand  20 reps  Without UE support  Marching on foam  2 minutes  Without UE support  Static stance on foam 2 minutes Intermittent minA for safety due to LOB       Blank cell = exercise not performed today   PATIENT EDUCATION: Education details: HEP, benefits of exercise, and plan of care Person educated: Patient Education method: Explanation Education comprehension: verbalized understanding  HOME EXERCISE PROGRAM: [phone]  GOALS: Goals reviewed with patient? Yes  LONG TERM GOALS: Target date: 07/14/23  Patient will be able to complete her HEP with minimal cueing. Baseline:  Goal status: MET  2.  Patient will improve her timed up and go time to 12 seconds or less to reduce her fall risk. Baseline:  Goal status: INITIAL  3.   Patient will improve her 5 times sit to stand time to 13 seconds or less to reduce her fall risk. Baseline:  Goal status: INITIAL  4.  Patient will be able to safely navigate at least 3 steps without a handrail for improved household mobility. Baseline:  Goal status: INITIAL  ASSESSMENT:  CLINICAL IMPRESSION: Patient was introduced to multiple new interventions for improved static and dynamic stability on unstable terrain. She required minimal cueing with standing hamstring curls for proper exercise performance to facilitate hamstring engagement. She experienced no significant increase in lower extremity fatigue or discomfort. She was educated on the benefits of exercise after the completion of her plan of care. She reported understanding and expressed interest in potentially completing exercise classes at a local gym the conclusion of her plan of care. She reported feeling good upon the conclusion of treatment. Recommend that she continue with skilled physical therapy to address her remaining impairments to maximize her safety and functional mobility.  OBJECTIVE IMPAIRMENTS: Abnormal gait, decreased balance, decreased cognition, decreased endurance, decreased mobility, difficulty walking, and decreased strength.   ACTIVITY LIMITATIONS: stairs, transfers, and locomotion level  PARTICIPATION LIMITATIONS: shopping, community activity, and yard work  PERSONAL FACTORS: Age, Time since onset of injury/illness/exacerbation, and 3+ comorbidities: hypertension, arthritis, osteoporosis, anxiety, memory deficits, and depression  are also affecting patient's functional outcome.   REHAB POTENTIAL: Fair    CLINICAL DECISION MAKING: Evolving/moderate complexity  EVALUATION COMPLEXITY: Moderate  PLAN:  PT FREQUENCY: 2x/week  PT DURATION: 4 weeks  PLANNED INTERVENTIONS: 97164- PT Re-evaluation, 97110-Therapeutic exercises, 97530- Therapeutic activity, 97112- Neuromuscular re-education, 97535- Self  Care, 02859- Manual therapy, [phone]- Gait training, Patient/Family education, Balance training, and Stair training  PLAN FOR NEXT SESSION: Nustep, balance interventions, and interventions for lower extremity strengthening and power    Lacinda JAYSON Fass, PT 07/13/2023, 12:16 PM

## 2023-07-17 ENCOUNTER — Ambulatory Visit: Payer: Medicare Other

## 2023-07-17 DIAGNOSIS — R2681 Unsteadiness on feet: Secondary | ICD-10-CM | POA: Diagnosis not present

## 2023-07-17 NOTE — Therapy (Signed)
 OUTPATIENT PHYSICAL THERAPY NEURO TREATMENT   Patient Name: Karina Clark MRN: [id number] DOB:01/08/1946, 78 y.o., female Today's Date: 07/17/2023  REFERRING PROVIDER: Roise Cleaver, MD   END OF SESSION:  PT End of Session - 07/17/23 1109     Visit Number 6    Number of Visits 8    Date for PT Re-Evaluation 08/04/23    PT Start Time 1100    PT Stop Time 1132    PT Time Calculation (min) 32 min    Activity Tolerance Patient tolerated treatment well    Behavior During Therapy WFL for tasks assessed/performed                Past Medical History:  Diagnosis Date   Anxiety    Cataract    Depression    GERD (gastroesophageal reflux disease)    Hyperlipidemia    Hypertension    Osteopenia    Substance abuse (HCC)    DCed cocaine in 1998   Past Surgical History:  Procedure Laterality Date   EYE SURGERY     NASAL SEPTUM SURGERY     TUBAL LIGATION     Patient Active Problem List   Diagnosis Date Noted   Impairment of balance 06/08/2023   Primary hypertension 06/08/2023   Bee sting allergy 01/09/2023   Constipation 02/24/2022   Rectal prolapse 02/24/2022   Degenerative arthritis of right elbow 11/11/2021   Controlled substance agreement signed 09/10/2019   Long term prescription benzodiazepine use 09/10/2019   Depression 06/27/2016   Cannot sleep 12/26/2014   Anxiety 11/08/2011   Allergic rhinitis 11/01/2011   HLD (hyperlipidemia) 11/01/2011   Degeneration macular 11/01/2011   Arthritis, degenerative 11/01/2011   Osteoporosis 11/01/2011   Mixed incontinence 11/01/2011   Avitaminosis D 11/01/2011   Osteopenia 11/01/2011   Acid reflux 12/01/2009    ONSET DATE: approximately 1 year ago  REFERRING DIAG: Impairment of balance   THERAPY DIAG:  Unsteadiness on feet  Rationale for Evaluation and Treatment: Rehabilitation  SUBJECTIVE:                                                                                                                                                                                              SUBJECTIVE STATEMENT: She has been doing her HEP at home which feels good. Pt accompanied by: self  PERTINENT HISTORY: hypertension, arthritis, osteoporosis, anxiety, memory deficits, and depression  PAIN:  Are you having pain? Yes: NPRS scale: "no pain"  Pain location: low back Pain description: unable to describe her pain  PRECAUTIONS: Fall  RED FLAGS: None   WEIGHT BEARING RESTRICTIONS: No  FALLS:  Has patient fallen in last 6 months? Yes. Number of falls at least 1, but patient is unable to recall any other falls  LIVING ENVIRONMENT: Lives with: lives alone Lives in: House/apartment Stairs: Yes: External: 2-3 steps; none Has following equipment at home: None  PLOF: Independent  PATIENT GOALS: improved balance and safety  OBJECTIVE:  Note: Objective measures were completed at Evaluation unless otherwise noted.  COGNITION: Overall cognitive status: Impaired   SENSATION: Patient reports no numbness or tingling  EDEMA:  No edema observed  MUSCLE TONE: WFL bilaterally  LOWER EXTREMITY ROM: WFL for activities assessed  LOWER EXTREMITY MMT:    MMT Right Eval Left Eval  Hip flexion 4-/5 4/5  Hip extension    Hip abduction    Hip adduction    Hip internal rotation    Hip external rotation    Knee flexion 4/5 4+/5  Knee extension 4+/5 4+/5  Ankle dorsiflexion 4-/5 4-/5  Ankle plantarflexion    Ankle inversion    Ankle eversion    (Blank rows = not tested)  TRANSFERS: Assistive device utilized: None  Sit to stand: Complete Independence Stand to sit:  SBA with uncontrolled descent  GAIT: Gait pattern: step through pattern and decreased stride length Assistive device utilized: None Level of assistance: SBA Comments: patient drifts to the left while walking  FUNCTIONAL TESTS:  5 times sit to stand: 27.65 seconds without UE support Timed up and go (TUG): 15.08 seconds with close  supervision with minA due to LOB when turning                                                                                                                      TREATMENT DATE:                                         07/17/23 EXERCISE LOG  Exercise Repetitions and Resistance Comments  Nustep  L4 x 15 minutes   Standing heel/toe raise    Standing HS curl     Side stepping on foam   BUE support  Tandem walking on foam   BUE support  Narrow BOS on foam   With therapist perturbations   Blank cell = exercise not performed today                                    07/11/23 EXERCISE LOG  Exercise Repetitions and Resistance Comments  Nustep  L4 x 8 minutes    Rocker board  5 minutes   Seated hip ADD isometric  3 minutes w/ 5 second hold    Seated clams  Green t-band x 3 minutes    LAQ Green t-band @ ankles x 3 minutes    Standing hip ABD 1.5 minutes each     Blank cell = exercise not performed  today                                    06/23/23 EXERCISE LOG  Exercise Repetitions and Resistance Comments  Nustep  L3 x 15 minutes   LAQ 3# x 3 minutes    Rocker board  4 minutes BUE support  Sit to stand  20 reps  Without UE support  Marching on foam  2 minutes  Without UE support  Static stance on foam 2 minutes Intermittent minA for safety due to LOB       Blank cell = exercise not performed today   PATIENT EDUCATION: Education details: HEP, benefits of exercise, and plan of care Person educated: Patient Education method: Explanation Education comprehension: verbalized understanding  HOME EXERCISE PROGRAM: [phone]  GOALS: Goals reviewed with patient? Yes  LONG TERM GOALS: Target date: 07/14/23  Patient will be able to complete her HEP with minimal cueing. Baseline:  Goal status: MET  2.  Patient will improve her timed up and go time to 12 seconds or less to reduce her fall risk. Baseline: 2/10: 6.46 seconds Goal status: MET  3.  Patient will improve her 5 times sit to stand  time to 13 seconds or less to reduce her fall risk. Baseline: 2/10: 11.4 seconds Goal status: MET  4.  Patient will be able to safely navigate at least 3 steps without a handrail for improved household mobility. Baseline:  Goal status: MET  ASSESSMENT:  CLINICAL IMPRESSION:  Pt arrives for today's treatment session denying any pain, but reports feeling "blah" today due to the rain.  Pt able to perform TUG in 6.46 seconds and 5 STS test in 11.4 seconds meeting both of her LTGs by a large margin.  Pt also able to navigate four stairs with reciprocal pattern without use of handrail, meeting her stair LTG.  Pt states that her children are having a stair rail installed at her house for her safety.  Pt educated on the importance of staying as active as possible while not attending therapy.  Pt plans to go on hold at this time and will call the facility in a few weeks with her plans.  Pt denied any pain at completion of today's treatment session.  OBJECTIVE IMPAIRMENTS: Abnormal gait, decreased balance, decreased cognition, decreased endurance, decreased mobility, difficulty walking, and decreased strength.   ACTIVITY LIMITATIONS: stairs, transfers, and locomotion level  PARTICIPATION LIMITATIONS: shopping, community activity, and yard work  PERSONAL FACTORS: Age, Time since onset of injury/illness/exacerbation, and 3+ comorbidities: hypertension, arthritis, osteoporosis, anxiety, memory deficits, and depression  are also affecting patient's functional outcome.   REHAB POTENTIAL: Fair    CLINICAL DECISION MAKING: Evolving/moderate complexity  EVALUATION COMPLEXITY: Moderate  PLAN:  PT FREQUENCY: 2x/week  PT DURATION: 4 weeks  PLANNED INTERVENTIONS: 97164- PT Re-evaluation, 97110-Therapeutic exercises, 97530- Therapeutic activity, 97112- Neuromuscular re-education, 97535- Self Care, 54098- Manual therapy, [phone]- Gait training, Patient/Family education, Balance training, and Stair  training  PLAN FOR NEXT SESSION: Nustep, balance interventions, and interventions for lower extremity strengthening and power    Deryl Flora, PTA 07/17/2023, 11:38 AM

## 2023-07-18 ENCOUNTER — Other Ambulatory Visit: Payer: Self-pay | Admitting: Family Medicine

## 2023-07-18 ENCOUNTER — Encounter: Payer: Self-pay | Admitting: Family Medicine

## 2023-07-18 DIAGNOSIS — K5900 Constipation, unspecified: Secondary | ICD-10-CM

## 2023-08-01 DIAGNOSIS — M6289 Other specified disorders of muscle: Secondary | ICD-10-CM | POA: Diagnosis not present

## 2023-08-01 DIAGNOSIS — K5901 Slow transit constipation: Secondary | ICD-10-CM | POA: Diagnosis not present

## 2023-08-01 DIAGNOSIS — K5909 Other constipation: Secondary | ICD-10-CM | POA: Diagnosis not present

## 2023-08-01 DIAGNOSIS — Z1211 Encounter for screening for malignant neoplasm of colon: Secondary | ICD-10-CM | POA: Diagnosis not present

## 2023-08-09 ENCOUNTER — Encounter: Payer: Self-pay | Admitting: Family Medicine

## 2023-08-09 NOTE — Telephone Encounter (Signed)
 Please find out if [facility] PT does Pelvic floor PT. Pt. Would need to see me to work on each of her concerns.

## 2023-08-17 ENCOUNTER — Encounter: Payer: Self-pay | Admitting: Family Medicine

## 2023-08-17 ENCOUNTER — Telehealth: Payer: Self-pay

## 2023-08-17 NOTE — Telephone Encounter (Signed)
 Please help her arrange an appt. With me. Thanks, WS

## 2023-08-17 NOTE — Telephone Encounter (Signed)
 Copied from CRM [id number]. Topic: Clinical - Medical Advice >> Aug 17, 2023 10:23 AM Gery Pray wrote: Reason for CRM: Patient calling to inform provider that she is going to stop taking her alendronate (FOSAMAX) 70 MG tablet due to having a tooth removed that requires her to be off medication for 9 months.

## 2023-08-17 NOTE — Telephone Encounter (Signed)
 That will be fine.

## 2023-08-18 DIAGNOSIS — Z1211 Encounter for screening for malignant neoplasm of colon: Secondary | ICD-10-CM | POA: Diagnosis not present

## 2023-08-18 LAB — HM COLONOSCOPY

## 2023-08-23 ENCOUNTER — Telehealth: Payer: Self-pay

## 2023-08-23 NOTE — Telephone Encounter (Signed)
 She would have to have an office visit for that.

## 2023-08-23 NOTE — Telephone Encounter (Signed)
 Copied from CRM [id number]. Topic: General - Other >> Aug 23, 2023  8:17 AM Geroge Baseman wrote: Reason for CRM: Dr. Benard Rink ordered an xray for this patient and they were wanting to know if she can sign something in office to see her order from [facility] to get it completed. She doesn't want to have to drive all the way to Dr. Criselda Peaches office. Would like to know if dr stacks can see the scan in her mychart from [facility] or if she can sign something to have that order available to this office. [phone]

## 2023-08-23 NOTE — Telephone Encounter (Signed)
 I cannot see the order, but a call to Dr. N.'s office should suffice. If it is an x-ray it can be done here, but I would have to order it and be responsible for it, so I would have to see her. If it is not an x-ray I can discuss with her if she wants to return to his officeor have it done in a facility closer than [facility].

## 2023-08-24 ENCOUNTER — Telehealth: Payer: Self-pay

## 2023-08-24 ENCOUNTER — Ambulatory Visit (INDEPENDENT_AMBULATORY_CARE_PROVIDER_SITE_OTHER)

## 2023-08-24 ENCOUNTER — Ambulatory Visit (INDEPENDENT_AMBULATORY_CARE_PROVIDER_SITE_OTHER): Admitting: Family Medicine

## 2023-08-24 VITALS — BP 141/71 | HR 74 | Temp 97.7°F | Ht 59.0 in | Wt 97.2 lb

## 2023-08-24 DIAGNOSIS — K5900 Constipation, unspecified: Secondary | ICD-10-CM

## 2023-08-24 DIAGNOSIS — I878 Other specified disorders of veins: Secondary | ICD-10-CM | POA: Diagnosis not present

## 2023-08-24 DIAGNOSIS — R109 Unspecified abdominal pain: Secondary | ICD-10-CM | POA: Diagnosis not present

## 2023-08-24 DIAGNOSIS — E041 Nontoxic single thyroid nodule: Secondary | ICD-10-CM | POA: Diagnosis not present

## 2023-08-24 MED ORDER — PSYLLIUM 30.9 % PO POWD: ORAL | Status: AC

## 2023-08-24 MED ORDER — DOCUSATE SODIUM 100 MG PO CAPS
100.0000 mg | ORAL_CAPSULE | Freq: Two times a day (BID) | ORAL | 5 refills | Status: DC
Start: 2023-08-24 — End: 2024-04-03

## 2023-08-24 MED ORDER — POLYETHYLENE GLYCOL 3350 17 GM/SCOOP PO POWD: 17.0000 g | Freq: Two times a day (BID) | ORAL | 1 refills | Status: AC

## 2023-08-24 NOTE — Telephone Encounter (Signed)
 Imaging done today - will send final report once radiology has read.

## 2023-08-24 NOTE — Telephone Encounter (Signed)
 Copied from CRM [id number]. Topic: Clinical - Lab/Test Results >> Aug 24, 2023  4:18 PM Albin Felling L wrote: Reason for CRM: Patient requesting for xray results to be forwarded to Dr. Benard Rink as that provider had requested it originally.   Dr. Gaetana Michaelis [facility] [address] [place] [address] Ph: [phone] Fax: [phone]

## 2023-08-24 NOTE — Telephone Encounter (Signed)
 Addressed today during appointment. LS

## 2023-08-24 NOTE — Progress Notes (Signed)
 Subjective:  Patient ID: Karina Clark, female    DOB: 1945-11-23  Age: 78 y.o. MRN: [id number]  CC: Thyroid Problem (Thyroid nodule looked at. Around 2010 had biopsy and everything was good.  Wonders if it needs to be checked out again. Hoarse voice recently. Occasional neck pain. Constipation.) and GI Problem (Pt had colonoscopy a week or so ago. Since then she has not had a bowel movement. They recommended her to have imaging. 6 days day total. )   HPI Karina Clark presents for 1 year of constipation.  She recently had a colonoscopy and was well cleaned out for the test.  However there had been no bowel movement for 15 days prior to that colonoscopy prep.  She had a colonoscopy 6 days ago and has not had a bowel movement since then.  This is in spite of taking MiraLAX every day.  She is very concerned that her thyroid may be underactive or overactive.  She had a nodule 15 years ago and read that some hoarseness she developed recently could be from her thyroid in addition to the constipation being from the thyroid.     08/24/2023    2:46 PM 06/30/2023   10:25 AM 06/05/2023   11:30 AM  Depression screen PHQ 2/9  Decreased Interest 2 1 1   Down, Depressed, Hopeless 1 1 1   PHQ - 2 Score 3 2 2   Altered sleeping 1 1 2   Tired, decreased energy 1 1 2   Change in appetite 1 1 1   Feeling bad or failure about yourself  1 0 1  Trouble concentrating 0 1 1  Moving slowly or fidgety/restless 0 0 0  Suicidal thoughts 0 0 0  PHQ-9 Score 7 6 9   Difficult doing work/chores Not difficult at all Somewhat difficult Somewhat difficult    History Karina Clark has a past medical history of Anxiety, Cataract, Depression, GERD (gastroesophageal reflux disease), Hyperlipidemia, Hypertension, Osteopenia, and Substance abuse (HCC).   She has a past surgical history that includes Tubal ligation; Eye surgery; and Nasal septum surgery.   Her family history includes Cancer in her brother; Dementia in her father; Hip  fracture in her mother; Hyperlipidemia in her mother; Hypertension in her daughter and father; Neuropathy in her brother; Thyroid disease in her mother.She reports that she quit smoking about 28 years ago. Her smoking use included cigarettes. She started smoking about 54 years ago. She has a 12.9 pack-year smoking history. She has never used smokeless tobacco. She reports that she does not drink alcohol and does not use drugs.    ROS Review of Systems  Constitutional: Negative.   HENT: Negative.    Eyes:  Negative for visual disturbance.  Respiratory:  Negative for shortness of breath.   Cardiovascular:  Negative for chest pain.  Gastrointestinal:  Positive for abdominal distention and abdominal pain.  Musculoskeletal:  Negative for arthralgias.    Objective:  BP (!) 141/71   Pulse 74   Temp 97.7 F (36.5 C)   Ht 4\' 11"  (1.499 m)   Wt 97 lb 3.2 oz (44.1 kg)   SpO2 100%   BMI 19.63 kg/m   BP Readings from Last 3 Encounters:  08/24/23 (!) 141/71  06/30/23 134/65  06/05/23 (!) 158/69    Wt Readings from Last 3 Encounters:  08/24/23 97 lb 3.2 oz (44.1 kg)  06/30/23 95 lb (43.1 kg)  06/05/23 94 lb 12.8 oz (43 kg)     Physical Exam Constitutional:  General: She is not in acute distress.    Appearance: She is well-developed.  Cardiovascular:     Rate and Rhythm: Normal rate and regular rhythm.  Pulmonary:     Breath sounds: Normal breath sounds.  Abdominal:     General: There is distension.     Palpations: Abdomen is soft. There is no mass.     Tenderness: There is no abdominal tenderness. There is no guarding or rebound.  Musculoskeletal:        General: Normal range of motion.  Skin:    General: Skin is warm and dry.  Neurological:     Mental Status: She is alert and oriented to person, place, and time.    X-ray shows significant increase in stool and gas throughout the colon  Assessment & Plan:  Constipation, unspecified constipation type -     DG Abd 2  Views; Future -     Docusate Sodium; Take 1 capsule (100 mg total) by mouth 2 (two) times daily. To soften stool  Dispense: 60 capsule; Refill: 5  Thyroid nodule -     TSH -     CMP14+EGFR -     US THYROID; Future  Other orders -     Polyethylene Glycol 3350; Take 17 g by mouth 2 (two) times daily. May increase to three times a day if no good result obtained. May go as high as 4 scoops a day.  Dispense: 3350 g; Refill: 1 -     Psyllium; Drink 1 tablespoon dissolved in water, twice daily     Follow-up: No follow-ups on file.  Mechele Claude, M.D.

## 2023-08-25 LAB — CMP14+EGFR
ALT: 12 IU/L (ref 0–32)
AST: 22 IU/L (ref 0–40)
Albumin: 4.3 g/dL (ref 3.8–4.8)
Alkaline Phosphatase: 61 IU/L (ref 44–121)
BUN/Creatinine Ratio: 21 (ref 12–28)
BUN: 18 mg/dL (ref 8–27)
Bilirubin Total: 0.3 mg/dL (ref 0.0–1.2)
CO2: 27 mmol/L (ref 20–29)
Calcium: 10.1 mg/dL (ref 8.7–10.3)
Chloride: 102 mmol/L (ref 96–106)
Creatinine, Ser: 0.84 mg/dL (ref 0.57–1.00)
Globulin, Total: 2.1 g/dL (ref 1.5–4.5)
Glucose: 85 mg/dL (ref 70–99)
Potassium: 4.4 mmol/L (ref 3.5–5.2)
Sodium: 141 mmol/L (ref 134–144)
Total Protein: 6.4 g/dL (ref 6.0–8.5)
eGFR: 72 mL/min/{1.73_m2} (ref >59)

## 2023-08-25 LAB — TSH: TSH: 1.29 u[IU]/mL (ref 0.450–4.500)

## 2023-08-25 NOTE — Telephone Encounter (Signed)
 Noted. Thank you. LS

## 2023-08-28 ENCOUNTER — Encounter: Payer: Self-pay | Admitting: Family Medicine

## 2023-08-28 NOTE — Progress Notes (Signed)
Hello  Taffie,    Your lab result is normal and/or stable.Some minor variations that are not significant are commonly marked abnormal, but do not represent any medical problem for you.   Best regards,  Nachelle Negrette, M.D.

## 2023-09-06 ENCOUNTER — Encounter: Payer: Self-pay | Admitting: Family Medicine

## 2023-09-06 ENCOUNTER — Ambulatory Visit (HOSPITAL_COMMUNITY)
Admission: RE | Admit: 2023-09-06 | Discharge: 2023-09-06 | Disposition: A | Discharge status: Home / Self Care | Source: Ambulatory Visit | Attending: Family Medicine | Admitting: Family Medicine

## 2023-09-06 DIAGNOSIS — Z0389 Encounter for observation for other suspected diseases and conditions ruled out: Secondary | ICD-10-CM | POA: Diagnosis not present

## 2023-09-06 DIAGNOSIS — E041 Nontoxic single thyroid nodule: Secondary | ICD-10-CM | POA: Diagnosis not present

## 2023-10-17 DIAGNOSIS — K5909 Other constipation: Secondary | ICD-10-CM | POA: Diagnosis not present

## 2023-10-17 DIAGNOSIS — K5901 Slow transit constipation: Secondary | ICD-10-CM | POA: Diagnosis not present

## 2023-10-17 DIAGNOSIS — M6289 Other specified disorders of muscle: Secondary | ICD-10-CM | POA: Diagnosis not present

## 2023-12-04 ENCOUNTER — Telehealth: Payer: Self-pay

## 2023-12-04 ENCOUNTER — Ambulatory Visit (INDEPENDENT_AMBULATORY_CARE_PROVIDER_SITE_OTHER): Admitting: Family Medicine

## 2023-12-04 VITALS — BP 118/73 | Temp 98.3°F | Ht 59.0 in | Wt 95.8 lb

## 2023-12-04 DIAGNOSIS — R55 Syncope and collapse: Secondary | ICD-10-CM

## 2023-12-04 NOTE — Telephone Encounter (Signed)
 Yes, it could be. It can't be proven though.

## 2023-12-04 NOTE — Telephone Encounter (Signed)
 Would pts symptoms be related to vagus nerve?

## 2023-12-04 NOTE — Telephone Encounter (Signed)
 Left message making [name] aware and to call back if needed.

## 2023-12-04 NOTE — Telephone Encounter (Signed)
 Cara returned called. She is concerned about a possible mini stroke. Pt did not have any facial drooping or paralysis over the weekend and BP was normal. [name] is working on pts diet and exercise plan. Pt does have memory concerns and can become upset because she realizes it. Would like to discuss symptoms with Dr. Zollie in an office visit.

## 2023-12-04 NOTE — Progress Notes (Signed)
 `  Subjective:  Patient ID: Karina Clark, female    DOB: Feb 28, 1946  Age: 78 y.o. MRN: [id number]  CC: synscope (Patient reports she was siting down reading and drinking coffee and she felt weird and dropped her coffee and fell like she blank out. Did not pass out or fall. Granddaughter wanted her to be seen. Pt has been feeling fatigued as well. )   HPI KYMORAH KORF presents for Two mornings ago she was sitting in her chair reading a book. Drinking coffee. Next thing the coffee is in her lap. No recollection of spilling it. Felt jittery after. Nervous.   Energy poor. Memory terrible. Makes her feel depressed. Has to ask people how to do things. Forgets how to access her movie subscription.      12/04/2023    3:14 PM 08/24/2023    2:46 PM 06/30/2023   10:25 AM  Depression screen PHQ 2/9  Decreased Interest 1 2 1   Down, Depressed, Hopeless 1 1 1   PHQ - 2 Score 2 3 2   Altered sleeping 1 1 1   Tired, decreased energy 2 1 1   Change in appetite 1 1 1   Feeling bad or failure about yourself  0 1 0  Trouble concentrating 0 0 1  Moving slowly or fidgety/restless 1 0 0  Suicidal thoughts 0 0 0  PHQ-9 Score 7 7 6   Difficult doing work/chores Somewhat difficult Not difficult at all Somewhat difficult    History Lilac has a past medical history of Anxiety, Cataract, Depression, GERD (gastroesophageal reflux disease), Hyperlipidemia, Hypertension, Osteopenia, and Substance abuse (HCC).   She has a past surgical history that includes Tubal ligation; Eye surgery; and Nasal septum surgery.   Her family history includes Cancer in her brother; Dementia in her father; Hip fracture in her mother; Hyperlipidemia in her mother; Hypertension in her daughter and father; Neuropathy in her brother; Thyroid  disease in her mother.She reports that she quit smoking about 28 years ago. Her smoking use included cigarettes. She started smoking about 54 years ago. She has a 12.9 pack-year smoking history. She has  never used smokeless tobacco. She reports that she does not drink alcohol and does not use drugs.    ROS Review of Systems  Constitutional: Negative.   HENT:  Negative for congestion.   Eyes:  Negative for visual disturbance.  Respiratory:  Negative for shortness of breath.   Cardiovascular:  Negative for chest pain.  Gastrointestinal:  Negative for abdominal pain, constipation, diarrhea, nausea and vomiting.  Genitourinary:  Negative for difficulty urinating.  Musculoskeletal:  Negative for arthralgias and myalgias.  Neurological:  Negative for headaches.  Psychiatric/Behavioral:  Negative for sleep disturbance.     Objective:  BP 118/73   Temp 98.3 F (36.8 C)   Ht 4' 11 (1.499 m)   Wt 95 lb 12.8 oz (43.5 kg)   SpO2 100%   BMI 19.35 kg/m   BP Readings from Last 3 Encounters:  12/04/23 118/73  08/24/23 (!) 141/71  06/30/23 134/65    Wt Readings from Last 3 Encounters:  12/04/23 95 lb 12.8 oz (43.5 kg)  08/24/23 97 lb 3.2 oz (44.1 kg)  06/30/23 95 lb (43.1 kg)     Physical Exam Constitutional:      General: She is not in acute distress.    Appearance: She is well-developed.   Cardiovascular:     Rate and Rhythm: Normal rate and regular rhythm.  Pulmonary:     Breath sounds: Normal breath sounds.  Musculoskeletal:        General: Normal range of motion.   Skin:    General: Skin is warm and dry.   Neurological:     Mental Status: She is alert and oriented to person, place, and time.      Assessment & Plan:  Vasovagal syncope   Patient reassured that this appears to be a totally benign occurrence.  Should it recur she will report back in and we will consider further testing at that time.  Follow-up: Return in about 3 months (around 03/05/2024).  Butler Der, M.D.

## 2023-12-04 NOTE — Telephone Encounter (Signed)
 Copied from CRM [id number]. Topic: Clinical - Medical Advice >> Dec 04, 2023  8:30 AM Farrel B wrote: Reason for CRM: patient grand daughter (cara woods) called from [phone], please call after lunch so that she can speak more in depth about what is going on with with the patient. She states that on Saturday the patient passed out in the midst of reading a book and drinking  coffee, but advised the granddaughter that right after she had a bowel movement she felt a lot better. Granddaughter states that the patient does have issues with constipation, and wanted to know if it was related to the vagus nerve that may have caused her to pass out .

## 2023-12-05 ENCOUNTER — Encounter: Payer: Self-pay | Admitting: Family Medicine

## 2023-12-05 ENCOUNTER — Telehealth: Payer: Self-pay | Admitting: Family Medicine

## 2023-12-05 ENCOUNTER — Ambulatory Visit: Payer: Self-pay | Admitting: *Deleted

## 2023-12-05 NOTE — Telephone Encounter (Signed)
 FYI Only or Action Required?: FYI only for provider.  Patient was last seen in primary care on 12/04/2023 by Zollie Lowers, MD. Called Nurse Triage reporting No chief complaint on file.. Symptoms began several years ago. Interventions attempted: Rest, hydration, or home remedies. Symptoms are: gradually worsening.  Triage Disposition: See Physician Within 24 Hours  Patient/caregiver understands and will follow disposition?: Yes Reason for Disposition  [1] Worsening confusion AND [2] gradual onset (days to weeks)  Answer Assessment - Initial Assessment Questions 1. MAIN CONCERN OR SYMPTOM:  What is your main concern right now? What questions do you have? What's the main symptom you're worried about? (e.g., confusion, memory loss)     Frustration, Difficulty performing tasks  2. ONSET:  When did the symptom start (or worsen)? (minutes, hours, days, weeks)     Acute  3. BETTER-SAME-WORSE: Are you (the patient) getting better, staying the same, or getting worse compared to the day you (they) were diagnosed or most recent hospital discharge ?     Worsening  4. DIAGNOSIS: Was the dementia diagnosed by a doctor? If Yes, ask: When? (e.g., days, months, years ago)     Never been diagnosed  5. MEDICINES: Has there been any change in medicines recently? (e.g., narcotics, antihistamines, benzodiazepines, etc.)      Antihistamines, new, last fall  6. OTHER SYMPTOMS: Are there any other symptoms? (e.g., fever, cough, pain, falling)     Woozy, Dizziness  7. SUPPORT: Document living circumstances and support (e.g., family, nursing home)     She lives alone, Her daughter lives 10 minutes from her, Granddaughter lives 35 minutes from her.  Protocols used: Dementia Symptoms and Questions-A-AH

## 2023-12-05 NOTE — Telephone Encounter (Signed)
 Copied from CRM [id number]. Topic: Clinical - Red Word Triage >> Dec 05, 2023  1:14 PM Alfonso ORN wrote: Red Word that prompted transfer to Nurse Triage: cara woods(granddaughter ) called on patient behalf stated  over a year notice behavior and cognitive issues with patient .  losing balance and falling , patient told Granddaugher on last saturday told granddaugter stated that she had black out hours later after happen , Granddaughter stated not sure what really happened    [name] call back # [phone]  Agent lost call due to system issue ,

## 2023-12-05 NOTE — Telephone Encounter (Signed)
 Attempted to contact patient's granddaughter [name], on [place], to review sx of cognitive/ behavioral changes. No answer, LVMTCB [phone].     Copied from CRM [id number]. Topic: Clinical - Red Word Triage >> Dec 05, 2023  1:14 PM Alfonso ORN wrote: Red Word that prompted transfer to Nurse Triage: cara woods(granddaughter ) called on patient behalf stated  over a year notice behavior and cognitive issues with patient .  losing balance and falling , patient told Granddaugher on last saturday told granddaugter stated that she had black out hours later after happen , Granddaughter stated not sure what really happened    [name] call back # [phone]  Agent lost call due to system issue ,

## 2023-12-05 NOTE — Telephone Encounter (Signed)
 Second Attmpt, LVM to retrun call to [phone]   Copied from CRM [id number]. Topic: Clinical - Red Word Triage >> Dec 05, 2023  1:14 PM Karina Clark ORN wrote: Red Word that prompted transfer to Nurse Triage: cara woods(granddaughter ) called on patient behalf stated  over a year notice behavior and cognitive issues with patient .  losing balance and falling , patient told Granddaugher on last saturday told granddaugter stated that she had black out hours later after happen , Granddaughter stated not sure what really happened     [name] call back # [phone]   Agent lost call due to system issue ,

## 2023-12-06 ENCOUNTER — Ambulatory Visit: Admitting: Family Medicine

## 2023-12-06 ENCOUNTER — Ambulatory Visit: Payer: Medicare Other

## 2023-12-06 VITALS — BP 118/73 | HR 74 | Ht 59.0 in | Wt 95.0 lb

## 2023-12-06 DIAGNOSIS — Z0000 Encounter for general adult medical examination without abnormal findings: Secondary | ICD-10-CM

## 2023-12-06 NOTE — Patient Instructions (Signed)
 Ms. Vanderpool , Thank you for taking time out of your busy schedule to complete your Annual Wellness Visit with me. I enjoyed our conversation and look forward to speaking with you again next year. I, as well as your care team,  appreciate your ongoing commitment to your health goals. Please review the following plan we discussed and let me know if I can assist you in the future. Your Game plan/ To Do List    Follow up Visits: Next Medicare AWV with our clinical staff: 12/09/24 at 1:50p.m.   Next Office Visit with your provider: 12/13/23 at 2:25p.m.  Clinician Recommendations:  Aim for 30 minutes of exercise or brisk walking, 6-8 glasses of water, and 5 servings of fruits and vegetables each day.       This is a list of the screening recommended for you and due dates:  Health Maintenance  Topic Date Due   COVID-19 Vaccine (5 - 2024-25 season) 04/27/2023   Medicare Annual Wellness Visit  12/05/2023   Flu Shot  01/05/2024   DEXA scan (bone density measurement)  11/30/2024   DTaP/Tdap/Td vaccine (2 - Td or Tdap) 10/23/2031   Pneumococcal Vaccine for age over 38  Completed   Hepatitis C Screening  Completed   Zoster (Shingles) Vaccine  Completed   Hepatitis B Vaccine  Aged Out   HPV Vaccine  Aged Out   Meningitis B Vaccine  Aged Out   Colon Cancer Screening  Discontinued    Advanced directives: (In Chart) A copy of your advanced directives are scanned into your chart should your provider ever need it. Advance Care Planning is important because it:  [x]  Makes sure you receive the medical care that is consistent with your values, goals, and preferences  [x]  It provides guidance to your family and loved ones and reduces their decisional burden about whether or not they are making the right decisions based on your wishes.  Follow the link provided in your after visit summary or read over the paperwork we have mailed to you to help you started getting your Advance Directives in place. If you need  assistance in completing these, please reach out to us  so that we can help you!  See attachments for Preventive Care and Fall Prevention Tips.

## 2023-12-06 NOTE — Telephone Encounter (Signed)
 Has she passed out again? She should be seen for dementia test (MMSE)

## 2023-12-06 NOTE — Telephone Encounter (Signed)
 No episodes of passing out. This was referring to what happened  before we saw her. Pt has now been scheduled for MMSE. LS

## 2023-12-06 NOTE — Progress Notes (Signed)
 Subjective:   Karina Clark is a 78 y.o. who presents for a Medicare Wellness preventive visit.  As a reminder, Annual Wellness Visits don't include a physical exam, and some assessments may be limited, especially if this visit is performed virtually. We may recommend an in-person follow-up visit with your provider if needed.  Visit Complete: Virtual I connected with  Karina Clark on 12/06/23 by a audio enabled telemedicine application and verified that I am speaking with the correct person using two identifiers.  Patient Location: Home  Provider Location: Home Office  I discussed the limitations of evaluation and management by telemedicine. The patient expressed understanding and agreed to proceed.  Vital Signs: Because this visit was a virtual/telehealth visit, some criteria may be missing or patient reported. Any vitals not documented were not able to be obtained and vitals that have been documented are patient reported.  VideoDeclined- This patient declined Librarian, academic. Therefore the visit was completed with audio only.  Persons Participating in Visit: Patient.  AWV Questionnaire: No: Patient Medicare AWV questionnaire was not completed prior to this visit.  Cardiac Risk Factors include: advanced age (>51men, >44 women);dyslipidemia     Objective:    Today's Vitals   12/06/23 1343  BP: 118/73  Pulse: 74  Weight: 95 lb (43.1 kg)  Height: 4' 11 (1.499 m)   Body mass index is 19.19 kg/m.     12/06/2023    1:47 PM 12/05/2022    3:36 PM 12/03/2021    9:51 AM 12/02/2019   10:26 AM 11/29/2018   10:12 AM 08/07/2017    2:26 PM 11/17/2015   11:25 AM  Advanced Directives  Does Patient Have a Medical Advance Directive? Yes Yes Yes Yes Yes Yes  No   Type of Estate agent of [place];Living will Healthcare Power of [place];Living will Healthcare Power of [place];Living will Healthcare Power of Attorney Living will;Healthcare  Power of [employer] Power of [place];Living will   Does patient want to make changes to medical advance directive?  No - Patient declined  No - Patient declined No - Patient declined  No - Patient declined    Copy of Healthcare Power of Attorney in Chart? Yes - validated most recent copy scanned in chart (See row information) Yes - validated most recent copy scanned in chart (See row information) Yes - validated most recent copy scanned in chart (See row information) No - copy requested No - copy requested  No - copy requested    Would patient like information on creating a medical advance directive?     No - Patient declined   No - patient declined information;Yes - Educational materials given      Data saved with a previous flowsheet row definition    Current Medications (verified) Outpatient Encounter Medications as of 12/06/2023  Medication Sig   aspirin EC 81 MG tablet Take 81 mg by mouth daily.   atorvastatin  (LIPITOR) 20 MG tablet Take 1 tablet (20 mg total) by mouth daily. for cholesterol.   diclofenac  Sodium (VOLTAREN ) 1 % GEL Apply 2 g topically 4 (four) times daily as needed (right shoulder and elbow pain).   docusate sodium  (COLACE) 100 MG capsule Take 1 capsule (100 mg total) by mouth 2 (two) times daily. To soften stool   fluticasone  (FLONASE ) 50 MCG/ACT nasal spray Place 2 sprays into both nostrils daily.   ketoconazole  (NIZORAL ) 2 % cream Apply 1 Application topically daily.   levocetirizine (XYZAL ) 5  MG tablet Take 1 tablet (5 mg total) by mouth every evening.   meloxicam  (MOBIC ) 7.5 MG tablet TAKE 1 TABLET BY MOUTH ONCE DAILY FOR MUSCLE AND JOINT PAIN   meloxicam  (MOBIC ) 7.5 MG tablet Take 1 tablet (7.5 mg total) by mouth daily.   olmesartan  (BENICAR ) 20 MG tablet Take 1 tablet (20 mg total) by mouth daily. for blood pressure   Omega-3 1000 MG CAPS Take 3 g by mouth daily.    pantoprazole  (PROTONIX ) 40 MG tablet TAKE 1 TABLET BY MOUTH ONCE DAILY FOR STOMACH    polyethylene glycol powder (GLYCOLAX /MIRALAX ) 17 GM/SCOOP powder Take 17 g by mouth 2 (two) times daily. May increase to three times a day if no good result obtained. May go as high as 4 scoops a day.   Psyllium 30.9 % POWD Drink 1 tablespoon dissolved in water, twice daily   traZODone  (DESYREL ) 150 MG tablet Use from 1/3 to 1 tablet nightly as needed for sleep.   venlafaxine  XR (EFFEXOR -XR) 150 MG 24 hr capsule Take 1 capsule (150 mg total) by mouth at bedtime.   Vitamin D , Cholecalciferol, 1000 UNITS CAPS Take 2,000 Units by mouth daily.    zinc  gluconate 50 MG tablet Take 1 tablet (50 mg total) by mouth daily.   No facility-administered encounter medications on file as of 12/06/2023.    Allergies (verified) Seroquel  [place]ier ], Mycelex  [clotrimazole ], Naproxen, and Sulfamethoxazole-trimethoprim   History: Past Medical History:  Diagnosis Date   Anxiety    Cataract    Depression    GERD (gastroesophageal reflux disease)    Hyperlipidemia    Hypertension    Osteopenia    Substance abuse (HCC)    DCed cocaine in 1998   Past Surgical History:  Procedure Laterality Date   EYE SURGERY     NASAL SEPTUM SURGERY     TUBAL LIGATION     Family History  Problem Relation Age of Onset   Hyperlipidemia Mother    Thyroid  disease Mother    Hip fracture Mother    Hypertension Father    Dementia Father    Cancer Brother        spine   Neuropathy Brother    Hypertension Daughter    Breast cancer Neg Hx    Colon cancer Neg Hx    Colon polyps Neg Hx    Social History   Socioeconomic History   Marital status: Widowed    Spouse name: Not on file   Number of children: 1   Years of education: 31   Highest education level: 12th grade  Occupational History   Occupation: Caregiver    Comment: retired  Tobacco Use   Smoking status: Former    Current packs/day: 0.00    Average packs/day: 0.5 packs/day for 25.9 years (12.9 ttl pk-yrs)    Types: Cigarettes    Start date: 03/24/1969     Quit date: 02/05/1995    Years since quitting: 28.8   Smokeless tobacco: Never  Vaping Use   Vaping status: Never Used  Substance and Sexual Activity   Alcohol use: No    Alcohol/week: 0.0 standard drinks of alcohol   Drug use: No   Sexual activity: Not Currently  Other Topics Concern   Not on file  Social History Narrative   Widowed since 1998. She has one adult daughter and 2 grandchildren. She enjoys reading. She lives in a one story home with steps up onto her deck with a handrail.    Her daughter has  mental health issues - she stays worried about her and she is helping care for her, going to her house daily   Social Drivers of Health   Financial Resource Strain: Low Risk  (12/06/2023)   Overall Financial Resource Strain (CARDIA)    Difficulty of Paying Living Expenses: Not hard at all  Food Insecurity: No Food Insecurity (12/06/2023)   Hunger Vital Sign    Worried About Running Out of Food in the Last Year: Never true    Ran Out of Food in the Last Year: Never true  Transportation Needs: No Transportation Needs (12/06/2023)   PRAPARE - Administrator, Civil Service (Medical): No    Lack of Transportation (Non-Medical): No  Physical Activity: Insufficiently Active (12/06/2023)   Exercise Vital Sign    Days of Exercise per Week: 3 days    Minutes of Exercise per Session: 10 min  Stress: Stress Concern Present (12/06/2023)   [employer] of Occupational Health - Occupational Stress Questionnaire    Feeling of Stress: To some extent  Social Connections: Socially Isolated (12/06/2023)   Social Connection and Isolation Panel    Frequency of Communication with Friends and Family: More than three times a week    Frequency of Social Gatherings with Friends and Family: Once a week    Attends Religious Services: Never    Database administrator or Organizations: No    Attends Banker Meetings: Never    Marital Status: Widowed    Tobacco Counseling Counseling  given: Yes    Clinical Intake:  Pre-visit preparation completed: Yes  Pain : No/denies pain     BMI - recorded: 19.19 Nutritional Status: BMI <19  Underweight Nutritional Risks: None Diabetes: No  No results found for: HGBA1C   How often do you need to have someone help you when you read instructions, pamphlets, or other written materials from your doctor or pharmacy?: 1 - Never  Interpreter Needed?: No  Information entered by :: alia t/cma   Activities of Daily Living     12/06/2023    1:46 PM  In your present state of health, do you have any difficulty performing the following activities:  Hearing? 0  Vision? 0  Difficulty concentrating or making decisions? 0  Walking or climbing stairs? 0  Dressing or bathing? 0  Doing errands, shopping? 0  Preparing Food and eating ? N  Using the Toilet? N  In the past six months, have you accidently leaked urine? Y  Do you have problems with loss of bowel control? Y  Managing your Medications? N  Managing your Finances? N  Housekeeping or managing your Housekeeping? N    Patient Care Team: Zollie Lowers, MD as PCP - General (Family Medicine) Eda Baxter, MD as Referring Physician (Dermatology) Ladora Ross Lacy Phebe, MD as Referring Physician (Optometry)  I have updated your Care Teams any recent Medical Services you may have received from other providers in the past year.     Assessment:   This is a routine wellness examination for [facility].  Hearing/Vision screen Hearing Screening - Comments:: Pt denies hearing dif Vision Screening - Comments:: Pt wear glasses denies vision/pt goes to [facility] -Dr. Lucien, [place]/last 10/25 have an upcoming apt in 10/26   Goals Addressed             This Visit's Progress    Patient Stated       Pt would like to try to go to the gym  Depression Screen     12/06/2023    1:58 PM 12/04/2023    3:14 PM 08/24/2023    2:46 PM 06/30/2023   10:25 AM 06/05/2023   11:30 AM  06/05/2023   11:14 AM 05/29/2023    2:06 PM  PHQ 2/9 Scores  PHQ - 2 Score 2 2 3 2 2  0 2  PHQ- 9 Score 7 7 7 6 9  12     Fall Risk     12/06/2023    1:45 PM 06/05/2023   11:30 AM 06/05/2023   11:14 AM 05/29/2023    2:06 PM 01/09/2023    4:19 PM  Fall Risk   Falls in the past year? 0 1 0 1 1  Number falls in past yr: 0 1  1 0  Injury with Fall? 0 0  0 0  Risk for fall due to : Impaired balance/gait;Impaired mobility History of fall(s)  History of fall(s) History of fall(s)  Follow up Falls evaluation completed Falls evaluation completed  Falls evaluation completed Falls evaluation completed    MEDICARE RISK AT HOME:  Medicare Risk at Home Any stairs in or around the home?: Yes If so, are there any without handrails?: Yes Home free of loose throw rugs in walkways, pet beds, electrical cords, etc?: Yes Adequate lighting in your home to reduce risk of falls?: Yes Life alert?: No Use of a cane, walker or w/c?: No Grab bars in the bathroom?: No Shower chair or bench in shower?: No Elevated toilet seat or a handicapped toilet?: No  TIMED UP AND GO:  Was the test performed?  no  Cognitive Function: 6CIT completed    06/30/2023   10:11 AM 08/07/2017    2:40 PM 11/17/2015   11:38 AM  MMSE - Mini Mental State Exam  Orientation to time 5 5 5    Orientation to Place 5 5 5    Registration 2 3 3    Attention/ Calculation 4 5 5    Recall 2 3 3    Language- name 2 objects 2 2 2    Language- repeat 1 1 1   Language- follow 3 step command 3 3 3    Language- read & follow direction 1 1 1    Write a sentence 1 1 1    Copy design 1 1 1    Total score 27 30 30       Data saved with a previous flowsheet row definition        12/06/2023    1:48 PM 12/05/2022    3:37 PM 12/03/2021    9:53 AM 12/02/2019   10:35 AM 11/29/2018   10:17 AM  6CIT Screen  What Year? 0 points 0 points 0 points 0 points 0 points  What month? 0 points 0 points 0 points  0 points  What time? 0 points 0 points 0 points 0  points 0 points  Count back from 20 0 points 0 points 0 points 0 points 0 points  Months in reverse 0 points 0 points 0 points 0 points 2 points  Repeat phrase 0 points 0 points 0 points 0 points 0 points  Total Score 0 points 0 points 0 points  2 points    Immunizations Immunization History  Administered Date(s) Administered   Fluad Quad(high Dose 65+) 02/19/2019, 03/13/2020, 03/08/2021, 03/02/2022   Hepatitis A, Adult 11/09/2005   Influenza, High Dose Seasonal PF 02/23/2016, 02/27/2017, 03/14/2018   Influenza, Seasonal, Injecte, Preservative Fre 03/04/2014   Influenza,inj,Quad PF,6+ Mos 03/05/2015   Influenza,trivalent, recombinat, inj, PF  04/03/2013   Influenza-Unspecified 05/09/2012   Moderna SARS-COV2 Booster Vaccination 09/10/2020, 03/09/2021   Moderna Sars-Covid-2 Vaccination 09/25/2019, 10/23/2019, 04/24/2020   Pfizer Covid-19 Vaccine Bivalent Booster 81yrs & up 03/02/2023   Pneumococcal Conjugate-13 11/28/2013   Pneumococcal Polysaccharide-23 05/09/2012   Tdap 10/22/2021   Zoster Recombinant(Shingrix ) 06/25/2018, 06/25/2018, 08/20/2018   Zoster, Live 01/07/2008    Screening Tests Health Maintenance  Topic Date Due   COVID-19 Vaccine (5 - 2024-25 season) 12/21/2024 (Originally 04/27/2023)   INFLUENZA VACCINE  01/05/2024   DEXA SCAN  11/30/2024   Medicare Annual Wellness (AWV)  12/05/2024   DTaP/Tdap/Td (2 - Td or Tdap) 10/23/2031   Pneumococcal Vaccine: 50+ Years  Completed   Hepatitis C Screening  Completed   Zoster Vaccines- Shingrix   Completed   Hepatitis B Vaccines  Aged Out   HPV VACCINES  Aged Out   Meningococcal B Vaccine  Aged Out   Colonoscopy  Discontinued    Health Maintenance  There are no preventive care reminders to display for this patient.  Health Maintenance Items Addressed: See Nurse Notes at the end of this note  Additional Screening:  Vision Screening: Recommended annual ophthalmology exams for early detection of glaucoma and other  disorders of the eye. Would you like a referral to an eye doctor? No    Dental Screening: Recommended annual dental exams for proper oral hygiene  Community Resource Referral / Chronic Care Management: CRR required this visit?  No   CCM required this visit?  No   Plan:    I have personally reviewed and noted the following in the patient's chart:   Medical and social history Use of alcohol, tobacco or illicit drugs  Current medications and supplements including opioid prescriptions. Patient is not currently taking opioid prescriptions. Functional ability and status Nutritional status Physical activity Advanced directives List of other physicians Hospitalizations, surgeries, and ER visits in previous 12 months Vitals Screenings to include cognitive, depression, and falls Referrals and appointments  In addition, I have reviewed and discussed with patient certain preventive protocols, quality metrics, and best practice recommendations. A written personalized care plan for preventive services as well as general preventive health recommendations were provided to patient.   Ozie Ned, CMA   12/06/2023   After Visit Summary: (MyChart) Due to this being a telephonic visit, the after visit summary with patients personalized plan was offered to patient via MyChart   Notes: Nothing significant to report at this time.

## 2023-12-07 ENCOUNTER — Other Ambulatory Visit: Payer: Self-pay | Admitting: Family Medicine

## 2023-12-07 DIAGNOSIS — Z1231 Encounter for screening mammogram for malignant neoplasm of breast: Secondary | ICD-10-CM

## 2023-12-13 ENCOUNTER — Encounter: Payer: Self-pay | Admitting: Family Medicine

## 2023-12-13 ENCOUNTER — Ambulatory Visit: Admitting: Family Medicine

## 2023-12-13 VITALS — BP 115/71 | HR 76 | Ht 59.0 in | Wt 96.0 lb

## 2023-12-13 DIAGNOSIS — R413 Other amnesia: Secondary | ICD-10-CM | POA: Diagnosis not present

## 2023-12-13 NOTE — Progress Notes (Signed)
 SABRA

## 2023-12-13 NOTE — Progress Notes (Signed)
 Subjective:  Patient ID: Karina Clark, female    DOB: 05-11-1946  Age: 78 y.o. MRN: [id number]  CC: Memory Loss (Pt and family members have noticed that she is forgetful and more agitated.)   HPI Karina Clark presents for concern for dementia. Grand daughter notices she is forgetful and wants her tested.     12/13/2023    2:34 PM 06/30/2023   10:11 AM 08/07/2017    2:40 PM  MMSE - Mini Mental State Exam  Orientation to time 5 5 5   Orientation to Place 5 5 5   Registration 2 2 3   Attention/ Calculation 5 4 5   Recall 2 2 3   Language- name 2 objects 2 2 2   Language- repeat 1 1 1   Language- follow 3 step command 3 3 3   Language- read & follow direction 1 1 1   Write a sentence 1 1 1   Copy design 1 1 1   Total score 28 27 30         12/13/2023    2:35 PM 12/06/2023    1:58 PM 12/04/2023    3:14 PM  Depression screen PHQ 2/9  Decreased Interest 2 1 1   Down, Depressed, Hopeless 1 1 1   PHQ - 2 Score 3 2 2   Altered sleeping 1 1 1   Tired, decreased energy 2 2 2   Change in appetite 1 1 1   Feeling bad or failure about yourself  1 0 0  Trouble concentrating 0 0 0  Moving slowly or fidgety/restless 1 1 1   Suicidal thoughts 0 0 0  PHQ-9 Score 9 7 7   Difficult doing work/chores Somewhat difficult Somewhat difficult Somewhat difficult    History Karina Clark has a past medical history of Anxiety, Cataract, Depression, GERD (gastroesophageal reflux disease), Hyperlipidemia, Hypertension, Osteopenia, and Substance abuse (HCC).   She has a past surgical history that includes Tubal ligation; Eye surgery; and Nasal septum surgery.   Her family history includes Cancer in her brother; Dementia in her father; Hip fracture in her mother; Hyperlipidemia in her mother; Hypertension in her daughter and father; Neuropathy in her brother; Thyroid  disease in her mother.She reports that she quit smoking about 28 years ago. Her smoking use included cigarettes. She started smoking about 54 years ago. She has a  12.9 pack-year smoking history. She has never used smokeless tobacco. She reports that she does not drink alcohol and does not use drugs.     Objective:  BP 115/71   Pulse 76   Ht 4' 11 (1.499 m)   Wt 96 lb (43.5 kg)   SpO2 97%   BMI 19.39 kg/m   BP Readings from Last 3 Encounters:  12/13/23 115/71  12/06/23 118/73  12/04/23 118/73    Wt Readings from Last 3 Encounters:  12/13/23 96 lb (43.5 kg)  12/06/23 95 lb (43.1 kg)  12/04/23 95 lb 12.8 oz (43.5 kg)     Physical Exam Constitutional:      General: She is not in acute distress.    Appearance: She is well-developed.  Cardiovascular:     Rate and Rhythm: Normal rate and regular rhythm.  Pulmonary:     Breath sounds: Normal breath sounds.  Musculoskeletal:        General: Normal range of motion.  Skin:    General: Skin is warm and dry.  Neurological:     Mental Status: She is alert and oriented to person, place, and time.      Assessment & Plan:  LOM (loss  of memory)   Near normal MMSE. Mild loss oe registration and recall only  Follow-up: Return in about 6 months (around 06/14/2024).  Butler Der, M.D.

## 2023-12-19 NOTE — Telephone Encounter (Signed)
 Nurse, please tell patient:   Of course we can do that.  The ideal is that you come into the office 2 to 5 days in advance to have your blood drawn.  That way we can have the results to discuss while you are there.  Just call the office to make a lab appointment for the day you wish to have the blood drawn.  We will be sure the order for the test you requested is available at that time.  If you wish to come in sooner than October in order to investigate these tests I would be happy to order them sooner.  Nurse, please put in future orders for the labs the patient listed in her request.

## 2023-12-20 ENCOUNTER — Other Ambulatory Visit: Payer: Self-pay

## 2023-12-20 DIAGNOSIS — I10 Essential (primary) hypertension: Secondary | ICD-10-CM

## 2023-12-20 DIAGNOSIS — R413 Other amnesia: Secondary | ICD-10-CM

## 2023-12-20 DIAGNOSIS — E559 Vitamin D deficiency, unspecified: Secondary | ICD-10-CM

## 2023-12-20 DIAGNOSIS — R55 Syncope and collapse: Secondary | ICD-10-CM

## 2023-12-20 DIAGNOSIS — E782 Mixed hyperlipidemia: Secondary | ICD-10-CM

## 2023-12-20 DIAGNOSIS — G3184 Mild cognitive impairment, so stated: Secondary | ICD-10-CM

## 2023-12-20 DIAGNOSIS — E041 Nontoxic single thyroid nodule: Secondary | ICD-10-CM

## 2023-12-29 DIAGNOSIS — R051 Acute cough: Secondary | ICD-10-CM | POA: Diagnosis not present

## 2023-12-29 DIAGNOSIS — B9689 Other specified bacterial agents as the cause of diseases classified elsewhere: Secondary | ICD-10-CM | POA: Diagnosis not present

## 2023-12-29 DIAGNOSIS — Z20822 Contact with and (suspected) exposure to covid-19: Secondary | ICD-10-CM | POA: Diagnosis not present

## 2023-12-29 DIAGNOSIS — J988 Other specified respiratory disorders: Secondary | ICD-10-CM | POA: Diagnosis not present

## 2024-01-03 ENCOUNTER — Encounter

## 2024-01-03 ENCOUNTER — Ambulatory Visit

## 2024-01-03 ENCOUNTER — Ambulatory Visit: Payer: Self-pay | Admitting: *Deleted

## 2024-01-03 NOTE — Telephone Encounter (Signed)
 Apt scheduled.

## 2024-01-03 NOTE — Telephone Encounter (Signed)
 FYI Only or Action Required?: FYI only for provider.  Patient was last seen in primary care on 12/13/2023 by Zollie Lowers, MD.  Called Nurse Triage reporting Nasal Congestion (Chest congestion, nasal congestion).  Symptoms began a week ago.  Interventions attempted: Prescription medications: doxycycline.  Symptoms are: unchanged.  Triage Disposition: See HCP Within 4 Hours (Or PCP Triage)  Patient/caregiver understands and will follow disposition?:  yes  Patient was seen at [facility] for possible COVID- she was treated for bacterial infection- sinus- but she reports fever, not improving. Patient does report her congestion has cleared today. Patient reports no SOB. Patient wants to be seen at office tomorrow- she does not want to go to UC. Will treat fever with tylenol, hydrate and make sure she is eating- if she gets worse over the evening - patient instructed to go to ED. Patient voices understanding.  Reason for Disposition  [1] Fever > 101 F (38.3 C) AND [2] age > 60 years  Answer Assessment - Initial Assessment Questions 1. LOCATION: Where does it hurt?      Feels awful patient was seen at [facility] on 7/25 2. ONSET: When did the sinus pain start?  (e.g., hours, days)      Patient is having trouble remember when symptoms started- 2 weeks 3. SEVERITY: How bad is the pain?   (Scale 0-10; or none, mild, moderate or severe)     No pain 4. RECURRENT SYMPTOM: Have you ever had sinus problems before? If Yes, ask: When was the last time? and What happened that time?      Patient is taking- benzonate 100mg  and  doxycycline feels it is not helping 5. NASAL CONGESTION: Is the nose blocked? If Yes, ask: Can you open it or must you breathe through your mouth?     No congestion 6. NASAL DISCHARGE: Do you have discharge from your nose? If so ask, What color?     Yellow discharge- yesterday- seems to be clearer today 7. FEVER: Do you have a fever? If Yes, ask: What is it, how was it  measured, and when did it start?      Off/on- patient checked it 3 hours ago- fever 101- now 8. OTHER SYMPTOMS: Do you have any other symptoms? (e.g., sore throat, cough, earache, difficulty breathing)     headache  Protocols used: Sinus Pain or Congestion-A-AH   Copied from CRM #[id number]. Topic: Clinical - Red Word Triage >> Jan 03, 2024  4:22 PM Antwanette L wrote: Red Word that prompted transfer to Nurse Triage: Pt is experiencing a possible chest cold. Pt is having chest discomfort and whenever she blows her nose yellow mucus comes out.

## 2024-01-04 ENCOUNTER — Encounter: Payer: Self-pay | Admitting: Nurse Practitioner

## 2024-01-04 ENCOUNTER — Telehealth: Payer: Self-pay

## 2024-01-04 ENCOUNTER — Ambulatory Visit (INDEPENDENT_AMBULATORY_CARE_PROVIDER_SITE_OTHER): Admitting: Nurse Practitioner

## 2024-01-04 VITALS — BP 106/64 | HR 72 | Temp 97.7°F | Ht 59.0 in | Wt 94.0 lb

## 2024-01-04 DIAGNOSIS — R051 Acute cough: Secondary | ICD-10-CM

## 2024-01-04 MED ORDER — PREDNISONE 20 MG PO TABS: 40.0000 mg | ORAL_TABLET | Freq: Every day | ORAL | 0 refills | Status: AC

## 2024-01-04 NOTE — Telephone Encounter (Signed)
 Copied from CRM [id number]. Topic: Clinical - Medical Advice >> Jan 04, 2024 11:57 AM Graeme ORN wrote: Reason for CRM: Patient granddaughter called. States patient was seen today. Wanted to go by herself. [name] is trying to make sure that everything that has been going on was discussed at appt. After visit summary says she was seen for cough.[name] does not know if patient remembered why she was there. Has been having some confusion. Wants to make sure the ongoing fever was discussed. Varies between 99.5 and 101. Started antibiotics and completed and is still having the fever and overall not feeling well. Also been taking tylenol. [name] is trying to see if anything can be done to help patient feel any better. Would like a call back. Thank You

## 2024-01-04 NOTE — Patient Instructions (Signed)

## 2024-01-04 NOTE — Progress Notes (Signed)
 Subjective:    Patient ID: Karina Clark, female    DOB: 1945-07-09, 78 y.o.   MRN: [id number]   Chief Complaint: cough and congestion  HPI  Patient here by herself and was not sure why she is here. Her granddaughter sent a note stating that she has had cough and congestion for greater than a week. She went to urgent care on 12/29/23 and was dx with cough. Was given doxycycline and tessalon perles. Granddaughter states in note that she still has a cough.  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  HENT:  Positive for congestion.   Eyes:  Negative for pain.  Respiratory:  Positive for cough. Negative for shortness of breath.   Cardiovascular:  Negative for chest pain, palpitations and leg swelling.  Gastrointestinal:  Negative for abdominal pain.  Endocrine: Negative for polydipsia.  Skin:  Negative for rash.  Neurological:  Negative for dizziness, weakness and headaches.  Hematological:  Does not bruise/bleed easily.  All other systems reviewed and are negative.      Objective:   Physical Exam Constitutional:      Appearance: Normal appearance.  HENT:     Right Ear: Tympanic membrane normal.     Left Ear: Tympanic membrane  normal.     Nose: No congestion or rhinorrhea.     Mouth/Throat:     Mouth: Mucous membranes are moist.  Cardiovascular:     Rate and Rhythm: Normal rate and regular rhythm.     Heart sounds: Normal heart sounds.  Pulmonary:     Effort: Pulmonary effort is normal.     Breath sounds: Normal breath sounds. No wheezing or rales.     Comments: Dry cough Skin:    General: Skin is warm.  Neurological:     General: No focal deficit present.     Mental Status: She is alert.  Psychiatric:        Mood and Affect: Mood normal.        Behavior: Behavior normal.    BP 106/64   Pulse 72   Temp 97.7 F (36.5 C) (Temporal)   Ht 4' 11 (1.499 m)   Wt 94 lb (42.6 kg)   SpO2 100%   BMI 18.99 kg/m         Assessment & Plan:   Karina Clark in today with chief complaint of Fever (Off and on/Nausea/Not feeling good)   1. Acute cough (Primary) 1. Take meds as prescribed 2. Use a cool mist humidifier especially during the winter months and when heat has been humid. 3. Use saline nose sprays frequently 4. Saline irrigations of the nose can be very helpful if done frequently.  * 4X daily for 1 week*  * Use of a nettie pot can  be helpful with this. Follow directions with this* 5. Drink plenty of fluids 6. Keep thermostat turn down low 7.For any cough or congestion- mucinx as needed 8. For fever or aces or pains- take tylenol or ibuprofen appropriate for age and weight.  * for fevers greater than 101 orally you may alternate ibuprofen and tylenol every  3 hours.    - predniSONE  (DELTASONE ) 20 MG tablet; Take 2 tablets (40 mg total) by mouth daily with breakfast for 5 days. 2 po daily for 5 days  Dispense: 10 tablet; Refill: 0 - CBC with Differential/Platelet - CMP14+EGFR    The above assessment and management plan was discussed with the patient. The patient verbalized understanding of and has agreed to the management plan. Patient is aware to call the clinic if symptoms persist or  worsen. Patient is aware when to return to the clinic for a follow-up visit. Patient educated on when it is appropriate to go to the emergency department.   Elvera-Margaret Gladis, FNP

## 2024-01-05 ENCOUNTER — Ambulatory Visit: Payer: Self-pay | Admitting: Nurse Practitioner

## 2024-01-05 ENCOUNTER — Ambulatory Visit: Payer: Self-pay

## 2024-01-05 LAB — CMP14+EGFR
ALT: 11 IU/L (ref 0–32)
AST: 25 IU/L (ref 0–40)
Albumin: 3.9 g/dL (ref 3.8–4.8)
Alkaline Phosphatase: 77 IU/L (ref 44–121)
BUN/Creatinine Ratio: 28 (ref 12–28)
BUN: 25 mg/dL (ref 8–27)
Bilirubin Total: 0.3 mg/dL (ref 0.0–1.2)
CO2: 18 mmol/L — AB (ref 20–29)
Calcium: 10 mg/dL (ref 8.7–10.3)
Chloride: 106 mmol/L (ref 96–106)
Creatinine, Ser: 0.89 mg/dL (ref 0.57–1.00)
Globulin, Total: 2.8 g/dL (ref 1.5–4.5)
Glucose: 90 mg/dL (ref 70–99)
Potassium: 5.6 mmol/L — AB (ref 3.5–5.2)
Sodium: 145 mmol/L — AB (ref 134–144)
Total Protein: 6.7 g/dL (ref 6.0–8.5)
eGFR: 67 mL/min/1.73 (ref >59)

## 2024-01-05 LAB — CBC WITH DIFFERENTIAL/PLATELET
Basophils Absolute: 0 x10E3/uL (ref 0.0–0.2)
Basos: 1 %
EOS (ABSOLUTE): 0.1 x10E3/uL (ref 0.0–0.4)
Eos: 1 %
Hematocrit: 28.7 % — ABNORMAL LOW (ref 34.0–46.6)
Hemoglobin: 9.5 g/dL — ABNORMAL LOW (ref 11.1–15.9)
Immature Grans (Abs): 0 x10E3/uL (ref 0.0–0.1)
Immature Granulocytes: 0 %
Lymphocytes Absolute: 1.7 x10E3/uL (ref 0.7–3.1)
Lymphs: 34 %
MCH: 32 pg (ref 26.6–33.0)
MCHC: 33.1 g/dL (ref 31.5–35.7)
MCV: 97 fL (ref 79–97)
Monocytes Absolute: 0.8 x10E3/uL (ref 0.1–0.9)
Monocytes: 16 %
Neutrophils Absolute: 2.4 x10E3/uL (ref 1.4–7.0)
Neutrophils: 48 %
Platelets: 302 x10E3/uL (ref 150–450)
RBC: 2.97 x10E6/uL — ABNORMAL LOW (ref 3.77–5.28)
RDW: 12.8 % (ref 11.7–15.4)
WBC: 4.9 x10E3/uL (ref 3.4–10.8)

## 2024-01-05 NOTE — Telephone Encounter (Signed)
 FYI Only or Action Required?: FYI only for provider.  Patient was last seen in primary care on 01/04/2024 by Gladis Mustard, FNP.  Called Nurse Triage reporting Results.  Symptoms began today.  Interventions attempted: Nothing.  Symptoms are: stable.  Triage Disposition: No disposition on file.  Patient/caregiver understands and will follow disposition?:     Copied from CRM [id number]. Topic: Clinical - Lab/Test Results >> Jan 05, 2024  7:44 AM Emylou G wrote: [name] had questions on labs. Reason for Disposition  Nursing judgment or information in reference  Answer Assessment - Initial Assessment Questions 1. REASON FOR CALL: What is your main concern right now?     Called to go over NP note regarding hemocult card and iron supplement.  This RN was able to answer questions.  Protocols used: No Guideline Available-[facility]

## 2024-01-05 NOTE — Telephone Encounter (Signed)
 noted

## 2024-01-15 ENCOUNTER — Ambulatory Visit
Admission: RE | Admit: 2024-01-15 | Discharge: 2024-01-15 | Disposition: A | Discharge status: Home / Self Care | Source: Ambulatory Visit | Attending: Family Medicine | Admitting: Family Medicine

## 2024-01-15 DIAGNOSIS — Z1231 Encounter for screening mammogram for malignant neoplasm of breast: Secondary | ICD-10-CM

## 2024-01-17 ENCOUNTER — Ambulatory Visit: Payer: Self-pay

## 2024-01-17 NOTE — Telephone Encounter (Signed)
 Grand daughter not with pt, and not able to answer all questions, attempted to call pt to clarify, no answer and unable to leave a message.      Copied from CRM [id number]. Topic: Clinical - Red Word Triage >> Jan 17, 2024  4:04 PM Harlene ORN wrote: Red Word that prompted transfer to Nurse Triage:  Granddaughter called. Patient is having a low grade fever with lightheaded and weakness even with taking the iron supplements

## 2024-01-17 NOTE — Telephone Encounter (Signed)
 Attempted to call patient: no answer: left voicemail.  Nurse will be routing encounter to PCP office.

## 2024-01-18 NOTE — Telephone Encounter (Signed)
CALLED PATIENT, NO ANSWER, LEFT MESSAGE TO RETURN CALL 

## 2024-01-23 ENCOUNTER — Encounter: Payer: Self-pay | Admitting: Sports Medicine

## 2024-01-23 ENCOUNTER — Ambulatory Visit (INDEPENDENT_AMBULATORY_CARE_PROVIDER_SITE_OTHER): Admitting: Sports Medicine

## 2024-01-23 VITALS — BP 100/82 | HR 68 | Temp 98.1°F | Ht 59.0 in | Wt 96.2 lb

## 2024-01-23 DIAGNOSIS — E875 Hyperkalemia: Secondary | ICD-10-CM | POA: Diagnosis not present

## 2024-01-23 DIAGNOSIS — E559 Vitamin D deficiency, unspecified: Secondary | ICD-10-CM | POA: Diagnosis not present

## 2024-01-23 DIAGNOSIS — D649 Anemia, unspecified: Secondary | ICD-10-CM

## 2024-01-23 DIAGNOSIS — R413 Other amnesia: Secondary | ICD-10-CM | POA: Diagnosis not present

## 2024-01-23 DIAGNOSIS — R5383 Other fatigue: Secondary | ICD-10-CM | POA: Diagnosis not present

## 2024-01-23 DIAGNOSIS — R0982 Postnasal drip: Secondary | ICD-10-CM

## 2024-01-23 DIAGNOSIS — R490 Dysphonia: Secondary | ICD-10-CM

## 2024-01-23 DIAGNOSIS — M159 Polyosteoarthritis, unspecified: Secondary | ICD-10-CM | POA: Diagnosis not present

## 2024-01-23 DIAGNOSIS — K5900 Constipation, unspecified: Secondary | ICD-10-CM

## 2024-01-23 LAB — TSH: TSH: 2.12 m[IU]/L (ref 0.40–4.50)

## 2024-01-23 LAB — VITAMIN B12: Vitamin B-12: 408 pg/mL (ref 200–1100)

## 2024-01-23 LAB — VITAMIN D 25 HYDROXY (VIT D DEFICIENCY, FRACTURES): Vit D, 25-Hydroxy: 78 ng/mL (ref 30–100)

## 2024-01-23 NOTE — Progress Notes (Signed)
 Careteam: Patient Care Team: Sherlynn Madden, MD as PCP - General (Internal Medicine) Eda Baxter, MD as Referring Physician (Dermatology) Ladora Ross Lacy Phebe, MD as Referring Physician (Optometry)  PLACE OF SERVICE:  [facility]  Advanced Directive information    Allergies  Allergen Reactions   Seroquel  [Quetiapine ]     Drowsiness the next morning   Mycelex  [Clotrimazole ] Other (See Comments)    Worsened GERD / nausea   Naproxen Other (See Comments)    Makes my stomach burn   Sulfamethoxazole-Trimethoprim Nausea And Vomiting    Chief Complaint  Patient presents with   Establish Care    Transfer of Care     Discussed the use of AI scribe software for clinical note transcription with the patient, who gave verbal consent to proceed.  History of Present Illness  Karina Clark is a 78 year old female who presents to establish care c/o   low energy and declining memory.  She has experienced low energy for a couple of years, with a recent hemoglobin level of 9.5, down from 11.3 a year ago. She completed a course of antibiotics in July after an illness. She has a good appetite but is concerned about hoarseness in her voice for about a year. She uses nasal spray occasionally for nasal congestion and takes allergy medication daily. No cough, acid reflux symptoms, or nausea.  She reports dizziness and balance issues for a couple of years, with a fall in the spring but no injuries. She lives alone, with her daughter nearby, and still drives confidently. She has not used a cane or walker but discontinued physical therapy due to insurance issues.  She experiences joint pain in her arm and back, attributed to a fall in November. She has taken meloxicam  7.5 mg for pain and has been using iron supplements since August 1st. She also takes Metamucil for constipation, reports straining during bowel movements, and occasional blood in her stool, which she attributes to  hemorrhoids. She drinks about 30 ounces of water daily.  She has memory issues, including difficulty recalling names and words, noticeable since last year. She manages her finances with some assistance from her granddaughter. Her father had a history of dementia. She takes Effexor  for depression and anxiety.  She reports urinary leakage and performs pelvic health exercises at home. No burning sensation during urination. Her weight has been stable, with a slight increase from 94 to 96 pounds since December. She has a history of constipation, with bowel movements every few days, and uses stool softeners and Metamucil to manage this.  She had a normal colonoscopy in February with no polyps or diverticulosis. She sometimes has yellow phlegm but denies any current cough or fever. Her potassium levels were high two weeks ago    Review of Systems:  Review of Systems  Constitutional:  Positive for malaise/fatigue and weight loss. Negative for chills and fever.  HENT:  Negative for congestion and sore throat.   Eyes:  Negative for double vision.  Respiratory:  Negative for cough, sputum production and shortness of breath.   Cardiovascular:  Negative for chest pain, palpitations and leg swelling.  Gastrointestinal:  Positive for constipation and heartburn. Negative for abdominal pain and nausea.  Genitourinary:  Negative for dysuria, frequency and hematuria.  Musculoskeletal:  Negative for falls and myalgias.  Neurological:  Negative for dizziness.   Negative unless indicated in HPI.   Past Medical History:  Diagnosis Date   Anxiety    Cataract  Depression    GERD (gastroesophageal reflux disease)    Hyperlipidemia    Hypertension    Osteopenia    Substance abuse (HCC)    DCed cocaine in 1998   Past Surgical History:  Procedure Laterality Date   EYE SURGERY     NASAL SEPTUM SURGERY     TUBAL LIGATION     Social History:   reports that she quit smoking about 28 years ago. Her smoking  use included cigarettes. She started smoking about 54 years ago. She has a 12.9 pack-year smoking history. She has never used smokeless tobacco. She reports that she does not drink alcohol and does not use drugs.  Family History  Problem Relation Age of Onset   Hyperlipidemia Mother    Thyroid  disease Mother    Hip fracture Mother    Hypertension Father    Dementia Father    Cancer Brother        spine   Neuropathy Brother    Hypertension Daughter    Breast cancer Neg Hx    Colon cancer Neg Hx    Colon polyps Neg Hx     Medications: Patient's Medications  New Prescriptions   No medications on file  Previous Medications   ACETAMINOPHEN (TYLENOL) 500 MG TABLET    Take 500 mg by mouth as needed for fever or mild pain (pain score 1-3).   ASPIRIN EC 81 MG TABLET    Take 81 mg by mouth daily.   ATORVASTATIN  (LIPITOR) 20 MG TABLET    Take 1 tablet (20 mg total) by mouth daily. for cholesterol.   DICLOFENAC  SODIUM (VOLTAREN ) 1 % GEL    Apply 2 g topically 4 (four) times daily as needed (right shoulder and elbow pain).   DOCUSATE SODIUM  (COLACE) 100 MG CAPSULE    Take 1 capsule (100 mg total) by mouth 2 (two) times daily. To soften stool   FLUTICASONE  (FLONASE ) 50 MCG/ACT NASAL SPRAY    Place 2 sprays into both nostrils daily.   IRON-VITAMIN C (VITRON-C) 65-125 MG TABS       KETOCONAZOLE  (NIZORAL ) 2 % CREAM    Apply 1 Application topically daily.   LEVOCETIRIZINE (XYZAL ) 5 MG TABLET    Take 1 tablet (5 mg total) by mouth every evening.   MELOXICAM  (MOBIC ) 7.5 MG TABLET    Take 1 tablet (7.5 mg total) by mouth daily.   OLMESARTAN  (BENICAR ) 20 MG TABLET    Take 1 tablet (20 mg total) by mouth daily. for blood pressure   OMEGA-3 1000 MG CAPS    Take 3 g by mouth daily.    PANTOPRAZOLE  (PROTONIX ) 40 MG TABLET    TAKE 1 TABLET BY MOUTH ONCE DAILY FOR STOMACH   POLYETHYLENE GLYCOL POWDER (GLYCOLAX /MIRALAX ) 17 GM/SCOOP POWDER    Take 17 g by mouth 2 (two) times daily. May increase to three times  a day if no good result obtained. May go as high as 4 scoops a day.   PSYLLIUM 30.9 % POWD    Drink 1 tablespoon dissolved in water, twice daily   TRAZODONE  (DESYREL ) 150 MG TABLET    Use from 1/3 to 1 tablet nightly as needed for sleep.   VENLAFAXINE  XR (EFFEXOR -XR) 150 MG 24 HR CAPSULE    Take 1 capsule (150 mg total) by mouth at bedtime.   VITAMIN D , CHOLECALCIFEROL, 1000 UNITS CAPS    Take 2,000 Units by mouth daily.    ZINC  GLUCONATE 50 MG TABLET    Take  1 tablet (50 mg total) by mouth daily.  Modified Medications   No medications on file  Discontinued Medications   No medications on file    Physical Exam: Vitals:   01/23/24 1103  Height: 4' 11 (1.499 m)   Body mass index is 18.99 kg/m. BP Readings from Last 3 Encounters:  01/04/24 106/64  12/13/23 115/71  12/06/23 118/73   Wt Readings from Last 3 Encounters:  01/04/24 94 lb (42.6 kg)  12/13/23 96 lb (43.5 kg)  12/06/23 95 lb (43.1 kg)    Physical Exam Constitutional:      Appearance: Normal appearance.  HENT:     Head: Normocephalic and atraumatic.  Cardiovascular:     Rate and Rhythm: Normal rate and regular rhythm.  Pulmonary:     Effort: Pulmonary effort is normal. No respiratory distress.     Breath sounds: Normal breath sounds. No wheezing.  Abdominal:     General: Bowel sounds are normal. There is no distension.     Tenderness: There is no abdominal tenderness. There is no guarding or rebound.     Comments:    Musculoskeletal:        General: No swelling or tenderness.  Skin:    General: Skin is dry.  Neurological:     Mental Status: She is alert. Mental status is at baseline.     Motor: No weakness.     Labs reviewed: Basic Metabolic Panel: Recent Labs    08/24/23 1547 01/04/24 1140  NA 141 145*  K 4.4 5.6*  CL 102 106  CO2 27 18*  GLUCOSE 85 90  BUN 18 25  CREATININE 0.84 0.89  CALCIUM  10.1 10.0  TSH 1.290  --    Liver Function Tests: Recent Labs    08/24/23 1547 01/04/24 1140   AST 22 25  ALT 12 11  ALKPHOS 61 77  BILITOT 0.3 0.3  PROT 6.4 6.7  ALBUMIN 4.3 3.9   No results for input(s): LIPASE, AMYLASE in the last 8760 hours. No results for input(s): AMMONIA in the last 8760 hours. CBC: Recent Labs    01/04/24 1140  WBC 4.9  NEUTROABS 2.4  HGB 9.5*  HCT 28.7*  MCV 97  PLT 302   Lipid Panel: No results for input(s): CHOL, HDL, LDLCALC, TRIG, CHOLHDL, LDLDIRECT in the last 8760 hours. TSH: Recent Labs    08/24/23 1547  TSH 1.290   A1C: No results found for: HGBA1C  Assessment and Plan Assessment & Plan   1. Hoarseness of voice (Primary)  Pt c/o hoarseness since 1 yr Weight stable  Instructed to use flonase  and antihistamine daily If no improvement will refer to ENT at next visit  2. Post-nasal drip  Use flonase , antihistamine  3. Anemia, unspecified type  Recent colonoscopy - no polyps Will check iron studies  - Iron, TIBC and Ferritin Panel - Vitamin B12 - Vitamin B12 - TSH  4. Generalized OA  Pt c/o intermittent multiple joint pains Instructed to stop meloxicam   Take tylenol 1gm tid prn  Will refer to PT for muscle strengthening and balance training - Ambulatory referral to Home Health  5. Constipation, unspecified constipation type  Increase water intake Take colace   6. Hyperkalemia   - Potassium  7. Memory deficits  Will do moca testing next visit  Increase physical activity and cognitively engaging activities  8. Other fatigue  Will check labs   9. Vitamin D  deficiency   - Vitamin D , 25-hydroxy  Other orders -  acetaminophen (TYLENOL) 500 MG tablet; Take 500 mg by mouth as needed for fever or mild pain (pain score 1-3). - Iron-Vitamin C (VITRON-C) 65-125 MG TABS

## 2024-01-23 NOTE — Patient Instructions (Signed)
 Top meloxicam   Please try home physical therapy  Increase water intake Take miralax  daily   Take tylenol 1000 mg three times daily as needed

## 2024-01-24 ENCOUNTER — Telehealth: Payer: Self-pay

## 2024-01-24 ENCOUNTER — Ambulatory Visit: Payer: Self-pay | Admitting: Sports Medicine

## 2024-01-24 LAB — IRON,TIBC AND FERRITIN PANEL
%SAT: 16 % (ref 16–45)
Ferritin: 38 ng/mL (ref 16–288)
Iron: 46 ug/dL (ref 45–160)
TIBC: 293 ug/dL (ref 250–450)

## 2024-01-24 LAB — POTASSIUM: Potassium: 4.3 mmol/L (ref 3.5–5.3)

## 2024-01-24 NOTE — Telephone Encounter (Signed)
 Spoke with patients granddaughter Ulla who says that patient is still having a lot of dry throat and hoarseness that's been discussed with Dr Zollie and a NP here. Says she was last prescribed Xyzal  Rx and has tried that and a nasal spray but says nothing is working. Would prefer to be referred to [facility] in [facility] if possible.  Also, regarding Dr Stana Madden being listed as her PCP, granddaughter confirmed that Dr Zollie is still patients PCP but patient does see Dr Madden for Geriatric care because she specializes in it.

## 2024-01-24 NOTE — Telephone Encounter (Signed)
 Spoke with patients granddaughter Ulla who says that patient is still having a lot of dry throat and hoarseness that's been discussed with Dr Zollie and a NP here

## 2024-01-24 NOTE — Telephone Encounter (Signed)
 Spoke with patients granddaughter and documented in another telephone call. Will close this call out.

## 2024-01-24 NOTE — Telephone Encounter (Signed)
 Copied from CRM #[id number]. Topic: Referral - Request for Referral >> Jan 24, 2024 10:08 AM Antwanette L wrote: Did the patient discuss referral with their provider in the last year? Yes. Pt saw Keilin-Margaret on 01/04/24 and Dr. Zollie on 12/13/23. Pt pcp is Dr. Jackalyn Char. Pt only sees Dr. Sherlynn for cognitive health reasons.   Appointment offered? No  Type of order/referral and detailed reason for visit: ENT Specialist   Preference of office, provider, location: [facility] ([address], [place], [place] [address] phone number [phone])  If referral order, have you been seen by this specialty before? No   Can we respond through MyChart? No. Please call the pt granddaughter Harvest Ill) at [phone]

## 2024-01-24 NOTE — Telephone Encounter (Signed)
 For what issue does she want to see ENT?

## 2024-01-25 ENCOUNTER — Other Ambulatory Visit: Payer: Self-pay | Admitting: Family Medicine

## 2024-01-25 DIAGNOSIS — R490 Dysphonia: Secondary | ICD-10-CM

## 2024-01-25 NOTE — Telephone Encounter (Signed)
Referral placed, as requested WS 

## 2024-01-25 NOTE — Telephone Encounter (Signed)
 Left detailed message on granddaughters Harvest Ill) phone per signed DPR.

## 2024-01-26 NOTE — Telephone Encounter (Signed)
 Spoke with granddaughter and made her aware that referral has been placed and once its approved, someone will reach out to her to get patient scheduled. She voiced understanding.

## 2024-01-29 ENCOUNTER — Telehealth: Payer: Self-pay | Admitting: Family Medicine

## 2024-01-29 ENCOUNTER — Other Ambulatory Visit: Payer: Self-pay | Admitting: Family Medicine

## 2024-01-29 ENCOUNTER — Encounter: Payer: Self-pay | Admitting: Family Medicine

## 2024-01-29 MED ORDER — ALENDRONATE SODIUM 70 MG PO TABS: 70.0000 mg | ORAL_TABLET | ORAL | 3 refills | Status: AC

## 2024-01-29 NOTE — Telephone Encounter (Signed)
 Dr Zollie, can you advise on this one please   Copied from CRM [id number]. Topic: Clinical - Medication Question >> Jan 29, 2024  1:41 PM Karina Clark wrote: Reason for CRM: Patient would like to get back on her Fosamax //Please call to advise.

## 2024-01-29 NOTE — Telephone Encounter (Signed)
 Spoke to granddaughter [name] per signed DPR. Patient to be made aware.

## 2024-01-29 NOTE — Telephone Encounter (Signed)
 Please let the patient know that I sent their prescription to their pharmacy. Thanks, WS

## 2024-01-30 ENCOUNTER — Ambulatory Visit (INDEPENDENT_AMBULATORY_CARE_PROVIDER_SITE_OTHER)

## 2024-01-30 ENCOUNTER — Encounter: Payer: Self-pay | Admitting: Family Medicine

## 2024-01-30 ENCOUNTER — Telehealth: Payer: Self-pay

## 2024-01-30 ENCOUNTER — Ambulatory Visit: Admitting: Family Medicine

## 2024-01-30 VITALS — BP 120/65 | HR 71 | Temp 98.1°F | Ht 59.0 in | Wt 95.2 lb

## 2024-01-30 DIAGNOSIS — M545 Low back pain, unspecified: Secondary | ICD-10-CM | POA: Diagnosis not present

## 2024-01-30 DIAGNOSIS — R490 Dysphonia: Secondary | ICD-10-CM | POA: Diagnosis not present

## 2024-01-30 MED ORDER — BETAMETHASONE SOD PHOS & ACET 6 (3-3) MG/ML IJ SUSP
6.0000 mg | Freq: Once | INTRAMUSCULAR | Status: AC
  Administered 2024-01-30: 6 mg via INTRAMUSCULAR

## 2024-01-30 NOTE — Progress Notes (Signed)
 Subjective:  Patient ID: Karina Clark, female    DOB: 10/07/1945  Age: 78 y.o. MRN: [id number]  CC: Back Pain (From fall that she had several months ago)   HPI  Discussed the use of AI scribe software for clinical note transcription with the patient, who gave verbal consent to proceed.  History of Present Illness Karina Clark is a 78 year old female who presents with lower back pain and hoarseness.  She has been experiencing lower back pain that spans across her lower back since she fell off her porch several months ago. The pain is described as a pressure or pushing sensation, rated as 2 to 3 out of 10 in severity, and does not radiate to her buttocks or legs. She finds relief using a heating pad, which she uses frequently while sitting at home.  She also reports persistent hoarseness and a yellow discharge from her throat, which has been ongoing since a previous urgent care visit where she was treated with antibiotics. She was tested for COVID-19 at that time. The discharge is described as thick and mucousy yellow. She also experiences nasal congestion and has a history of nasal problems. No pain radiating to her buttocks or legs.          01/30/2024    2:52 PM 01/30/2024    2:17 PM 01/23/2024   11:51 AM  Depression screen PHQ 2/9  Decreased Interest 2 0 0  Down, Depressed, Hopeless 2 0 0  PHQ - 2 Score 4 0 0  Altered sleeping 2    Tired, decreased energy 2    Change in appetite 2    Feeling bad or failure about yourself  2    Trouble concentrating 2    Moving slowly or fidgety/restless 0    Suicidal thoughts 0    PHQ-9 Score 14    Difficult doing work/chores Somewhat difficult      History Tahjanae has a past medical history of Anxiety, Cataract, Depression, GERD (gastroesophageal reflux disease), Hyperlipidemia, Hypertension, Osteopenia, and Substance abuse (HCC).   She has a past surgical history that includes Tubal ligation; Eye surgery; and Nasal septum  surgery.   Her family history includes Cancer in her brother; Dementia in her father; Hip fracture in her mother; Hyperlipidemia in her mother; Hypertension in her daughter and father; Neuropathy in her brother; Thyroid  disease in her mother.She reports that she quit smoking about 29 years ago. Her smoking use included cigarettes. She started smoking about 54 years ago. She has a 12.9 pack-year smoking history. She has never used smokeless tobacco. She reports that she does not drink alcohol and does not use drugs.    ROS Review of Systems  Constitutional: Negative.   HENT: Negative.    Eyes:  Negative for visual disturbance.  Respiratory:  Negative for shortness of breath.   Cardiovascular:  Negative for chest pain.  Gastrointestinal:  Negative for abdominal pain.  Musculoskeletal:  Positive for back pain. Negative for arthralgias.    Objective:  BP 120/65   Pulse 71   Temp 98.1 F (36.7 C)   Ht 4' 11 (1.499 m)   Wt 95 lb 3.2 oz (43.2 kg)   SpO2 100%   BMI 19.23 kg/m   BP Readings from Last 3 Encounters:  01/30/24 120/65  01/23/24 100/82  01/04/24 106/64    Wt Readings from Last 3 Encounters:  01/30/24 95 lb 3.2 oz (43.2 kg)  01/23/24 96 lb 3.2 oz (43.6 kg)  01/04/24 94 lb (42.6 kg)     Physical Exam Constitutional:      General: She is not in acute distress.    Appearance: She is well-developed.  HENT:     Head: Normocephalic and atraumatic.  Eyes:     Conjunctiva/sclera: Conjunctivae normal.     Pupils: Pupils are equal, round, and reactive to light.  Cardiovascular:     Rate and Rhythm: Normal rate and regular rhythm.     Heart sounds: Normal heart sounds. No murmur heard. Pulmonary:     Effort: Pulmonary effort is normal. No respiratory distress.     Breath sounds: Normal breath sounds. No wheezing or rales.  Abdominal:     General: Bowel sounds are normal. There is no distension.     Palpations: Abdomen is soft.     Tenderness: There is no abdominal  tenderness.  Musculoskeletal:        General: Tenderness present. Normal range of motion.     Cervical back: Normal range of motion and neck supple.     Lumbar back: Spasms and tenderness present. No deformity.  Skin:    General: Skin is warm and dry.  Neurological:     Mental Status: She is alert and oriented to person, place, and time.     Deep Tendon Reflexes: Reflexes are normal and symmetric.  Psychiatric:        Behavior: Behavior normal.        Thought Content: Thought content normal.      Assessment & Plan:  Acute midline low back pain without sciatica -     DG Lumbar Spine 2-3 Views; Future -     Betamethasone  Sod Phos & Acet  Hoarseness    Assessment and Plan Assessment & Plan Hoarseness with yellow throat discharge   Hoarseness with yellow mucous discharge persists since a previous urgent care visit where she received antibiotics. Differential diagnosis includes sinus drainage due to allergy or infection. Review previous blood work results, including hemoglobin levels. Follow up with an ENT appointment for further evaluation.  Low back pain   Chronic low back pain follows a fall several months ago. Pain is described as a pressure sensation in the lower back, rated 2-3 out of 10 in severity. It is not exacerbated by walking and does not radiate to the buttocks or legs. A heating pad provides relief. Order an x-ray of the lower back to assess for any underlying issues.  Celestone  given, 1 ml IM, should be beneficial for both DX.Keep ENT appt.        Follow-up: No follow-ups on file.  Butler Der, M.D.

## 2024-01-30 NOTE — Telephone Encounter (Signed)
 Copied from CRM #[id number]. Topic: Referral - Status >> Jan 30, 2024 12:35 PM Carmell R wrote: Reason for CRM: Pt checking the status of the referral for physical therapy. Please follow up at [phone]

## 2024-01-30 NOTE — Telephone Encounter (Signed)
 Patient seen Dr.Veludanhi once (and transferred her care back to Dr.Stacks according to Care Team), we will send to [facility] to address referral status since we placed.

## 2024-01-31 ENCOUNTER — Other Ambulatory Visit: Payer: Self-pay | Admitting: Family Medicine

## 2024-01-31 ENCOUNTER — Telehealth: Payer: Self-pay

## 2024-01-31 DIAGNOSIS — L821 Other seborrheic keratosis: Secondary | ICD-10-CM | POA: Diagnosis not present

## 2024-01-31 DIAGNOSIS — L570 Actinic keratosis: Secondary | ICD-10-CM | POA: Diagnosis not present

## 2024-01-31 MED ORDER — ACETAMINOPHEN 500 MG PO TABS: 1000.0000 mg | ORAL_TABLET | Freq: Three times a day (TID) | ORAL | Status: AC

## 2024-01-31 NOTE — Telephone Encounter (Signed)
 Copied from CRM [id number]. Topic: Clinical - Medication Question >> Jan 31, 2024  3:14 PM Karina Clark wrote: Reason for CRM: Pt called for medication clarification. She needs to know what her PCP advised her about taking tylenol . Please advise  Best contact: [phone]

## 2024-01-31 NOTE — Telephone Encounter (Signed)
 Left detailed message making patient aware of Dr Zollie recommendations on taking the Extra strength tylenol .

## 2024-01-31 NOTE — Telephone Encounter (Signed)
 Tylenol  extra strength., 500 gm. Take 2 tabs, three times every day

## 2024-02-01 ENCOUNTER — Encounter: Payer: Self-pay | Admitting: Sports Medicine

## 2024-02-02 NOTE — Telephone Encounter (Signed)
Message routed to covering provider

## 2024-02-02 NOTE — Telephone Encounter (Signed)
 Regarding the patient message that was sent and forwarded to Dr. Caro this morning, Karina Clark is asking for a call back directly at [phone] instead of a response via Mychart. She is also asking regarding the testing she mentioned, where can that testing be done? Can it be done over at [facility]? Also can a sooner appt be set up at all with Dr. Sherlynn? Please follow up asap on this as its becoming more scary and dangerous for the pt she expressed, especially involving medications.

## 2024-02-02 NOTE — Telephone Encounter (Signed)
 Our process in office is to forward all messages with patient concerns to providers. Once provider has responded we will call patient or sent MyChart response. Message was sent to Harlene An, NP who is also currently seeing patients in office. Patient was seen by Dr.Veludandi 01/23/2024 who is currently not in office today. Dr.Veludandi also isn't patient primary care provider anymore, see telephone encounter 01/30/2024. Patient primary care is Zollie Lowers, MD. We have sent patient referral to our referral coordinator Carla.B 01/30/2024. Message routed back to [facility].

## 2024-02-06 DIAGNOSIS — R490 Dysphonia: Secondary | ICD-10-CM | POA: Diagnosis not present

## 2024-02-06 DIAGNOSIS — J329 Chronic sinusitis, unspecified: Secondary | ICD-10-CM | POA: Diagnosis not present

## 2024-02-06 DIAGNOSIS — K117 Disturbances of salivary secretion: Secondary | ICD-10-CM | POA: Diagnosis not present

## 2024-02-07 ENCOUNTER — Encounter: Payer: Self-pay | Admitting: Family Medicine

## 2024-02-09 ENCOUNTER — Other Ambulatory Visit: Payer: Self-pay | Admitting: Family Medicine

## 2024-02-09 DIAGNOSIS — K219 Gastro-esophageal reflux disease without esophagitis: Secondary | ICD-10-CM

## 2024-02-12 ENCOUNTER — Telehealth: Payer: Self-pay | Admitting: Family Medicine

## 2024-02-12 ENCOUNTER — Telehealth: Payer: Self-pay | Admitting: *Deleted

## 2024-02-12 ENCOUNTER — Ambulatory Visit: Payer: Self-pay | Admitting: Family Medicine

## 2024-02-12 NOTE — Telephone Encounter (Signed)
Pt. Needs to be seen for this. Thanks, WS 

## 2024-02-12 NOTE — Telephone Encounter (Signed)
-----   Message from Downing B sent at 02/12/2024 12:57 PM EDT ----- Regarding: FW: Home Health  ----- Message ----- From: Dillard, Jasmine E, CMA Sent: 02/12/2024  12:49 PM EDT To: Tobias JONETTA Ally Subject: RE: Home Health                                Im not in Clinical Intake for today, please send message to [facility] Laelani Vasko/CMA. ----- Message ----- From: Ally Tobias JONETTA Sent: 02/12/2024  12:19 PM EDT To: Jasmine E Dillard, CMA Subject: FW: Home Health                                Veludandi is requesting clinical team provide verbal order, as I am not clinical- see below.  Angela Coley [phone] [facility]) Thanks, Tobias ----- Message ----- From: Sherlynn Madden, MD Sent: 02/12/2024   9:55 AM EDT To: Tobias JONETTA Ally Subject: RE: Home Health                                For the verbal order OA cause for back pain. ----- Message ----- From: Ally Tobias D Sent: 02/09/2024   3:22 PM EDT To: Madden Sherlynn, MD Subject: Home Health                                    I received this message from Home Health. They need you to add a diagnosis. See Below: From: Lorretta Jon Erskin Tobias -- we can see her- but we do have a little issue with the order- the diagnosis on the MD note compared to the diagnosis on the order-- the note is for acute midline back pain without sciatica  and the order is generalized OA-- can you talk with the provider and get me a verbal for the OA being the cause for her back pain? This way her ins will pay for her home health care. Thanks Angie/ [facility]  -[place]

## 2024-02-12 NOTE — Telephone Encounter (Signed)
 Patient switched her PCP back to Dr. Butler Der on 01/24/2024. Called Granddaughter to confirm.    No Verbal order given due to orders will be sent and will have to be signed by PCP.

## 2024-02-12 NOTE — Telephone Encounter (Signed)
 Last discussed July of 2025  Copied from CRM #[id number]. Topic: Referral - Question >> Feb 12, 2024  1:51 PM Delon DASEN wrote: Reason for CRM: Granddaughter Cora calling about referral, wants to know if they can give one without having another office visit -  this is for the memory concerns  [phone]

## 2024-02-13 NOTE — Telephone Encounter (Signed)
 LMTCB to schedule appt

## 2024-02-14 DIAGNOSIS — M6289 Other specified disorders of muscle: Secondary | ICD-10-CM | POA: Diagnosis not present

## 2024-02-15 DIAGNOSIS — D508 Other iron deficiency anemias: Secondary | ICD-10-CM | POA: Diagnosis not present

## 2024-02-15 DIAGNOSIS — Z0189 Encounter for other specified special examinations: Secondary | ICD-10-CM | POA: Diagnosis not present

## 2024-02-15 DIAGNOSIS — R413 Other amnesia: Secondary | ICD-10-CM | POA: Diagnosis not present

## 2024-02-15 DIAGNOSIS — M6289 Other specified disorders of muscle: Secondary | ICD-10-CM | POA: Diagnosis not present

## 2024-02-19 ENCOUNTER — Ambulatory Visit: Admitting: Family Medicine

## 2024-02-21 ENCOUNTER — Telehealth: Payer: Self-pay

## 2024-02-21 NOTE — Telephone Encounter (Signed)
 Copied from CRM [id number]. Topic: Clinical - Medication Question >> Feb 21, 2024  2:29 PM [name] wrote: Reason for CRM: Wants to know why she takes an aspirin daily? Has questions for the clinic. Pt is anemic   Pt's granddaughter called, [name]. Please follow up with her.   Best contact: [phone]

## 2024-02-21 NOTE — Telephone Encounter (Signed)
 Left message to have Karina Clark(granddaughter) to call us  back per signed DPR.

## 2024-02-26 ENCOUNTER — Telehealth: Payer: Self-pay | Admitting: Family Medicine

## 2024-02-26 NOTE — Telephone Encounter (Unsigned)
 Copied from CRM [id number]. Topic: General - Other >> Feb 26, 2024  3:34 PM Willma R wrote: Reason for CRM: Patient returning call to [facility]. Advised to specifically ask for him when called back. Per CAL he is in with a patient, and requested a CRM be sent.   Patient can be reached at [phone] (home)

## 2024-02-27 DIAGNOSIS — D509 Iron deficiency anemia, unspecified: Secondary | ICD-10-CM | POA: Diagnosis not present

## 2024-02-27 NOTE — Telephone Encounter (Signed)
 Left detailed message with Karina Clark per signed DPR. Encouraged call back or send us  a Mychart message if there are any questions.

## 2024-02-27 NOTE — Telephone Encounter (Signed)
 DC the aspirin.

## 2024-02-27 NOTE — Telephone Encounter (Signed)
 Spoke to [employer] per signed [employer]. I told her I would send Dr. Zollie concerning taking aspirin and being diagnosed with anemia.

## 2024-02-27 NOTE — Telephone Encounter (Signed)
 Spoke to [employer] per signed [employer]. She is wanting to know if their is a contraindication to taking aspirin and being anemic. She asks should patient stop or continue taking aspirin? She states patient is receiving iron infusions as we spoke.

## 2024-02-29 DIAGNOSIS — Z9889 Other specified postprocedural states: Secondary | ICD-10-CM | POA: Diagnosis not present

## 2024-02-29 DIAGNOSIS — J329 Chronic sinusitis, unspecified: Secondary | ICD-10-CM | POA: Diagnosis not present

## 2024-03-06 ENCOUNTER — Ambulatory Visit: Admitting: Family Medicine

## 2024-03-18 ENCOUNTER — Telehealth: Payer: Self-pay

## 2024-03-18 DIAGNOSIS — J329 Chronic sinusitis, unspecified: Secondary | ICD-10-CM | POA: Diagnosis not present

## 2024-03-18 DIAGNOSIS — J320 Chronic maxillary sinusitis: Secondary | ICD-10-CM | POA: Diagnosis not present

## 2024-03-18 DIAGNOSIS — J322 Chronic ethmoidal sinusitis: Secondary | ICD-10-CM | POA: Diagnosis not present

## 2024-03-18 DIAGNOSIS — J321 Chronic frontal sinusitis: Secondary | ICD-10-CM | POA: Diagnosis not present

## 2024-03-18 DIAGNOSIS — Z9889 Other specified postprocedural states: Secondary | ICD-10-CM | POA: Diagnosis not present

## 2024-03-18 NOTE — Telephone Encounter (Signed)
 Copied from CRM [id number]. Topic: Clinical - Medication Question >> Mar 18, 2024  2:03 PM Mia F wrote: Reason for CRM: Pt granddaughter is looking for information on medications pt had taken in the past. She is not sure which medications it is or what they were for but would like to know what she has taken from 2010 and on. She is specifically asking about a reaction that occurred to a specific medication. Dr Zollie may not had been the one who prescribed them all. Please call back at [phone] [employer])

## 2024-03-18 NOTE — Telephone Encounter (Signed)
 Called and spoke to granddaughter she is aware for patient to have to come in and sign and medical release form to release records to them.

## 2024-03-20 ENCOUNTER — Ambulatory Visit: Admitting: Sports Medicine

## 2024-03-25 ENCOUNTER — Telehealth: Payer: Self-pay | Admitting: Family Medicine

## 2024-03-25 DIAGNOSIS — R7989 Other specified abnormal findings of blood chemistry: Secondary | ICD-10-CM

## 2024-03-25 DIAGNOSIS — J329 Chronic sinusitis, unspecified: Secondary | ICD-10-CM | POA: Diagnosis not present

## 2024-03-25 NOTE — Telephone Encounter (Unsigned)
 Copied from CRM #[id number]. Topic: Clinical - Medical Advice >> Mar 25, 2024  3:42 PM Corin V wrote: Reason for CRM: Patient's granddaughter [name] is calling to see if repeat iron labs are needed since an iron infusion was completed recently. Please advise if labs are needed and can just be ordered or if an appointment to see provider is necessary. Please call [name] at [phone] >> Mar 25, 2024  3:46 PM Corin V wrote: Patient is also wondering if repeat infusions will be needed. IT was ordered previously by Noujaim, Michael G, MD . She wants to make sure if labs are ordered, hemoglobin, RBC, and all other components are checked as well.

## 2024-03-26 ENCOUNTER — Other Ambulatory Visit: Payer: Self-pay | Admitting: Family Medicine

## 2024-03-26 NOTE — Telephone Encounter (Signed)
 Can get labs done, then follow up two days later with me to review results.

## 2024-03-26 NOTE — Telephone Encounter (Signed)
 Contacted Cara per signed DPR. Appointments made for lab and office visit.

## 2024-03-27 ENCOUNTER — Other Ambulatory Visit

## 2024-03-27 DIAGNOSIS — G3184 Mild cognitive impairment, so stated: Secondary | ICD-10-CM

## 2024-03-27 DIAGNOSIS — R7989 Other specified abnormal findings of blood chemistry: Secondary | ICD-10-CM

## 2024-03-27 DIAGNOSIS — R55 Syncope and collapse: Secondary | ICD-10-CM

## 2024-03-27 DIAGNOSIS — R413 Other amnesia: Secondary | ICD-10-CM | POA: Diagnosis not present

## 2024-03-27 DIAGNOSIS — I10 Essential (primary) hypertension: Secondary | ICD-10-CM

## 2024-03-27 DIAGNOSIS — E041 Nontoxic single thyroid nodule: Secondary | ICD-10-CM

## 2024-03-27 DIAGNOSIS — E782 Mixed hyperlipidemia: Secondary | ICD-10-CM

## 2024-03-27 DIAGNOSIS — E559 Vitamin D deficiency, unspecified: Secondary | ICD-10-CM

## 2024-03-28 ENCOUNTER — Telehealth: Payer: Self-pay

## 2024-03-28 DIAGNOSIS — M6289 Other specified disorders of muscle: Secondary | ICD-10-CM | POA: Diagnosis not present

## 2024-03-28 DIAGNOSIS — R195 Other fecal abnormalities: Secondary | ICD-10-CM | POA: Diagnosis not present

## 2024-03-28 DIAGNOSIS — D649 Anemia, unspecified: Secondary | ICD-10-CM | POA: Diagnosis not present

## 2024-03-28 DIAGNOSIS — Z8619 Personal history of other infectious and parasitic diseases: Secondary | ICD-10-CM | POA: Diagnosis not present

## 2024-03-28 LAB — CBC WITH DIFFERENTIAL/PLATELET
Basophils Absolute: 0 x10E3/uL (ref 0.0–0.2)
Basos: 0 %
EOS (ABSOLUTE): 0.1 x10E3/uL (ref 0.0–0.4)
Eos: 1 %
Hematocrit: 30.7 % — ABNORMAL LOW (ref 34.0–46.6)
Hemoglobin: 10.7 g/dL — ABNORMAL LOW (ref 11.1–15.9)
Immature Grans (Abs): 0 x10E3/uL (ref 0.0–0.1)
Immature Granulocytes: 0 %
Lymphocytes Absolute: 1.9 x10E3/uL (ref 0.7–3.1)
Lymphs: 22 %
MCH: 33.5 pg — ABNORMAL HIGH (ref 26.6–33.0)
MCHC: 34.9 g/dL (ref 31.5–35.7)
MCV: 96 fL (ref 79–97)
Monocytes Absolute: 1.5 x10E3/uL — ABNORMAL HIGH (ref 0.1–0.9)
Monocytes: 17 %
Neutrophils Absolute: 5.4 x10E3/uL (ref 1.4–7.0)
Neutrophils: 60 %
Platelets: 281 x10E3/uL (ref 150–450)
RBC: 3.19 x10E6/uL — ABNORMAL LOW (ref 3.77–5.28)
RDW: 14.8 % (ref 11.7–15.4)
WBC: 9 x10E3/uL (ref 3.4–10.8)

## 2024-03-28 LAB — LIPID PANEL
Chol/HDL Ratio: 2.5 ratio (ref 0.0–4.4)
Cholesterol, Total: 158 mg/dL (ref 100–199)
HDL: 63 mg/dL (ref >39)
LDL Chol Calc (NIH): 76 mg/dL (ref 0–99)
Triglycerides: 106 mg/dL (ref 0–149)
VLDL Cholesterol Cal: 19 mg/dL (ref 5–40)

## 2024-03-28 LAB — BASIC METABOLIC PANEL WITH GFR
BUN/Creatinine Ratio: 22 (ref 12–28)
BUN: 21 mg/dL (ref 8–27)
CO2: 23 mmol/L (ref 20–29)
Calcium: 9.6 mg/dL (ref 8.7–10.3)
Chloride: 100 mmol/L (ref 96–106)
Creatinine, Ser: 0.95 mg/dL (ref 0.57–1.00)
Glucose: 85 mg/dL (ref 70–99)
Potassium: 4.1 mmol/L (ref 3.5–5.2)
Sodium: 138 mmol/L (ref 134–144)
eGFR: 62 mL/min/1.73 (ref >59)

## 2024-03-28 LAB — TSH+FREE T4
Free T4: 1.07 ng/dL (ref 0.82–1.77)
TSH: 3.48 u[IU]/mL (ref 0.450–4.500)

## 2024-03-28 LAB — IRON,TIBC AND FERRITIN PANEL
Ferritin: 714 ng/mL — ABNORMAL HIGH (ref 15–150)
Iron Saturation: 5 % — CL (ref 15–55)
Iron: 10 ug/dL — ABNORMAL LOW (ref 27–139)
Total Iron Binding Capacity: 202 ug/dL — ABNORMAL LOW (ref 250–450)
UIBC: 192 ug/dL (ref 118–369)

## 2024-03-28 LAB — VITAMIN D 25 HYDROXY (VIT D DEFICIENCY, FRACTURES): Vit D, 25-Hydroxy: 60.4 ng/mL (ref 30.0–100.0)

## 2024-03-28 LAB — BRAIN NATRIURETIC PEPTIDE: BNP: 60.3 pg/mL (ref 0.0–100.0)

## 2024-03-28 LAB — VITAMIN B12: Vitamin B-12: 460 pg/mL (ref 232–1245)

## 2024-03-28 NOTE — Telephone Encounter (Signed)
 Copied from CRM #[id number]. Topic: Clinical - Medical Advice >> Mar 28, 2024  1:24 PM Avram MATSU wrote: Reason for CRM: granddaughter is calling for the patient. She stated her iron levels are still showing low. She asked if the patient would need an iron infusion or something else to bring up her iron levels.[name] wanted to follow up about what to do. Please advise [phone]

## 2024-03-28 NOTE — Telephone Encounter (Signed)
 Granddaughter going to call gastro since they ordered her previous iron infusion last month. Told her to call back if ordering is a issue. Granddaughter wants us  to be aware that she had a procedure on her sinuses and C Diff recently and that might be effecting lab results as well. FYI for provider. No response needed at this time. LS

## 2024-03-29 ENCOUNTER — Telehealth: Payer: Self-pay | Admitting: Family Medicine

## 2024-03-29 ENCOUNTER — Other Ambulatory Visit: Payer: Self-pay | Admitting: Nurse Practitioner

## 2024-03-29 NOTE — Telephone Encounter (Signed)
 Can wait for dr. Zollie.

## 2024-03-29 NOTE — Telephone Encounter (Signed)
 Copied from CRM [id number]. Topic: Clinical - Request for Lab/Test Order >> Mar 29, 2024  7:58 AM Montie POUR wrote: Reason for CRM:  Please call Ulla, granddaughter, at [phone] to discuss adding lab order to check her cortisol level. Ulla wants to see if Margery Szostak can come in Monday morning to have lab completed. Ulla also has questions about Mikeya Tomasetti taking venlafaxine  XR (EFFEXOR -XR) 150 MG 24 hr capsule. Could this medication cause her to be tired more? Thanks

## 2024-03-29 NOTE — Telephone Encounter (Signed)
 Granddaughetr informed. Lab apt made for Monday. LS

## 2024-04-01 ENCOUNTER — Telehealth: Payer: Self-pay

## 2024-04-01 ENCOUNTER — Other Ambulatory Visit: Payer: Self-pay

## 2024-04-01 ENCOUNTER — Telehealth: Payer: Self-pay | Admitting: Family Medicine

## 2024-04-01 ENCOUNTER — Other Ambulatory Visit

## 2024-04-01 DIAGNOSIS — R7989 Other specified abnormal findings of blood chemistry: Secondary | ICD-10-CM | POA: Diagnosis not present

## 2024-04-01 DIAGNOSIS — G3184 Mild cognitive impairment, so stated: Secondary | ICD-10-CM

## 2024-04-01 DIAGNOSIS — F5101 Primary insomnia: Secondary | ICD-10-CM

## 2024-04-01 DIAGNOSIS — R42 Dizziness and giddiness: Secondary | ICD-10-CM

## 2024-04-01 LAB — CBC WITH DIFFERENTIAL/PLATELET
Basophils Absolute: 0 x10E3/uL (ref 0.0–0.2)
Basos: 1 %
EOS (ABSOLUTE): 0.1 x10E3/uL (ref 0.0–0.4)
Eos: 1 %
Hematocrit: 30.7 % — ABNORMAL LOW (ref 34.0–46.6)
Hemoglobin: 9.7 g/dL — ABNORMAL LOW (ref 11.1–15.9)
Immature Grans (Abs): 0 x10E3/uL (ref 0.0–0.1)
Immature Granulocytes: 0 %
Lymphocytes Absolute: 1.3 x10E3/uL (ref 0.7–3.1)
Lymphs: 22 %
MCH: 31 pg (ref 26.6–33.0)
MCHC: 31.6 g/dL (ref 31.5–35.7)
MCV: 98 fL — ABNORMAL HIGH (ref 79–97)
Monocytes Absolute: 0.7 x10E3/uL (ref 0.1–0.9)
Monocytes: 13 %
Neutrophils Absolute: 3.6 x10E3/uL (ref 1.4–7.0)
Neutrophils: 62 %
Platelets: 266 x10E3/uL (ref 150–450)
RBC: 3.13 x10E6/uL — ABNORMAL LOW (ref 3.77–5.28)
RDW: 14.5 % (ref 11.7–15.4)
WBC: 5.7 x10E3/uL (ref 3.4–10.8)

## 2024-04-01 NOTE — Telephone Encounter (Signed)
 Will call back once resulted. LS

## 2024-04-01 NOTE — Telephone Encounter (Signed)
 Addressed through the Mychart message that the patient sent. Duplicate encounter. LS

## 2024-04-01 NOTE — Telephone Encounter (Signed)
 Copied from CRM [id number]. Topic: Clinical - Lab/Test Results >> Apr 01, 2024 11:06 AM Gustabo D wrote: Pt granddaughter would like a call back about her labs that were done today [phone] [facility]

## 2024-04-01 NOTE — Telephone Encounter (Signed)
 Pt has been scheduled for lab and granddaughter was spoken to. LS

## 2024-04-01 NOTE — Telephone Encounter (Signed)
 Copied from CRM [id number]. Topic: General - Call Back - No Documentation >> Apr 01, 2024  2:44 PM Karina Clark wrote: Reason for CRM: pt is calling asking if she could have her liver panels added to her order and please call pt back [phone]

## 2024-04-02 ENCOUNTER — Other Ambulatory Visit

## 2024-04-02 DIAGNOSIS — R42 Dizziness and giddiness: Secondary | ICD-10-CM

## 2024-04-02 DIAGNOSIS — F5101 Primary insomnia: Secondary | ICD-10-CM

## 2024-04-02 DIAGNOSIS — G3184 Mild cognitive impairment, so stated: Secondary | ICD-10-CM

## 2024-04-03 ENCOUNTER — Ambulatory Visit: Payer: Self-pay | Admitting: Family Medicine

## 2024-04-03 ENCOUNTER — Other Ambulatory Visit

## 2024-04-03 ENCOUNTER — Encounter: Payer: Self-pay | Admitting: Family Medicine

## 2024-04-03 ENCOUNTER — Ambulatory Visit: Admitting: Family Medicine

## 2024-04-03 VITALS — BP 111/72 | HR 72 | Temp 97.8°F | Ht 59.0 in | Wt 92.0 lb

## 2024-04-03 DIAGNOSIS — M2550 Pain in unspecified joint: Secondary | ICD-10-CM | POA: Diagnosis not present

## 2024-04-03 DIAGNOSIS — F3342 Major depressive disorder, recurrent, in full remission: Secondary | ICD-10-CM | POA: Diagnosis not present

## 2024-04-03 DIAGNOSIS — I10 Essential (primary) hypertension: Secondary | ICD-10-CM | POA: Diagnosis not present

## 2024-04-03 DIAGNOSIS — E782 Mixed hyperlipidemia: Secondary | ICD-10-CM

## 2024-04-03 DIAGNOSIS — M19021 Primary osteoarthritis, right elbow: Secondary | ICD-10-CM | POA: Diagnosis not present

## 2024-04-03 LAB — CORTISOL: Cortisol: 10.2 ug/dL (ref 6.2–19.4)

## 2024-04-03 MED ORDER — BUPROPION HCL ER (XL) 150 MG PO TB24: 150.0000 mg | ORAL_TABLET | Freq: Every day | ORAL | 1 refills | Status: DC

## 2024-04-03 MED ORDER — OLMESARTAN MEDOXOMIL 20 MG PO TABS: 20.0000 mg | ORAL_TABLET | Freq: Every day | ORAL | 3 refills | Status: AC

## 2024-04-03 MED ORDER — VENLAFAXINE HCL ER 75 MG PO CP24: 75.0000 mg | ORAL_CAPSULE | Freq: Every day | ORAL | 1 refills | Status: DC

## 2024-04-03 NOTE — Progress Notes (Signed)
 Subjective:  Patient ID: Karina Clark, female    DOB: May 28, 1946  Age: 78 y.o. MRN: [id number]  CC: Labs Only (Would like anemia and inflammation checked), Fever (Off and on fever since July. 100.7 Random no pattern. ), cognitive (Still having cognitive memory issue. ), Sinus Problem (Surgery on 10/13 for  chronic sinus infection. ), and Night Sweats (Could this be due to venlafaxine . Vivid dreams. )   HPI  Discussed the use of AI scribe software for clinical note transcription with the patient, who gave verbal consent to proceed.  History of Present Illness Karina Clark is a 78 year old female who presents with confusion and fatigue.  She has been experiencing confusion, fatigue, and low energy levels. She describes an incident where she mistook evening for morning, leading to confusion about the time of day. This has been a recurrent issue, with her daughter noting similar episodes, including calls at inappropriate times. She also reports brain fog, memory issues, and a general lack of energy, which she describes as 'no spunk and spark'.  She has a history of anemia, persistent since 2010, with worsening symptoms this year. Despite recent iron infusions, her iron saturation remains low, although her ferritin level is high. She has been experiencing yellow-colored stools for a few weeks. She is also recovering from a C. diff infection.  She has been dealing with depression, for which she takes venlafaxine . Her dose was increased from 75 mg to 150 mg last December due to worsening depression, particularly around Christmas time. She previously took escitalopram  but switched due to either side effects or ineffectiveness. Venlafaxine  does not make her sleepy, but she has experienced night sweats and vivid dreams.  She has joint pains, particularly in her knees and back, but not in her hands. Her daughter is concerned about the possibility of rheumatoid arthritis, although previous  tests for rheumatoid factor and sed rate were done years ago. She has also been experiencing weight loss and a decreased appetite, losing four pounds in the last few months.  Her current medications include venlafaxine  150 mg and atorvastatin .        12/13/2023    2:34 PM 06/30/2023   10:11 AM 08/07/2017    2:40 PM  MMSE - Mini Mental State Exam  Orientation to time 5 5 5   Orientation to Place 5 5 5   Registration 2 2 3   Attention/ Calculation 5 4 5   Recall 2 2 3   Language- name 2 objects 2 2 2   Language- repeat 1 1 1   Language- follow 3 step command 3 3 3   Language- read & follow direction 1 1 1   Write a sentence 1 1 1   Copy design 1 1 1   Total score 28 27 30         04/03/2024    2:38 PM 01/30/2024    2:52 PM 01/30/2024    2:17 PM  Depression screen PHQ 2/9  Decreased Interest 1 2 0  Down, Depressed, Hopeless 2 2 0  PHQ - 2 Score 3 4 0  Altered sleeping 2 2   Tired, decreased energy 3 2   Change in appetite 3 2   Feeling bad or failure about yourself  1 2   Trouble concentrating 2 2   Moving slowly or fidgety/restless 0 0   Suicidal thoughts 0 0   PHQ-9 Score 14 14   Difficult doing work/chores Very difficult Somewhat difficult     History Karina Clark has a past  medical history of Anxiety, Cataract, Depression, GERD (gastroesophageal reflux disease), Hyperlipidemia, Hypertension, Osteopenia, and Substance abuse (HCC).   She has a past surgical history that includes Tubal ligation; Eye surgery; and Nasal septum surgery.   Her family history includes Cancer in her brother; Dementia in her father; Hip fracture in her mother; Hyperlipidemia in her mother; Hypertension in her daughter and father; Neuropathy in her brother; Thyroid  disease in her mother.She reports that she quit smoking about 29 years ago. Her smoking use included cigarettes. She started smoking about 55 years ago. She has a 12.9 pack-year smoking history. She has never used smokeless tobacco. She reports that she  does not drink alcohol and does not use drugs.    ROS Review of Systems  Constitutional: Negative.   HENT:  Negative for congestion.   Eyes:  Negative for visual disturbance.  Respiratory:  Negative for shortness of breath.   Cardiovascular:  Negative for chest pain.  Gastrointestinal:  Positive for anal bleeding. Negative for abdominal pain, constipation, diarrhea, nausea and vomiting.  Genitourinary:  Negative for difficulty urinating.  Musculoskeletal:  Negative for arthralgias and myalgias.  Neurological:  Negative for headaches.  Psychiatric/Behavioral:  Negative for sleep disturbance.     Objective:  BP 111/72   Pulse 72   Temp 97.8 F (36.6 C)   Ht 4' 11 (1.499 m)   Wt 92 lb (41.7 kg)   SpO2 100%   BMI 18.58 kg/m   BP Readings from Last 3 Encounters:  04/03/24 111/72  01/30/24 120/65  01/23/24 100/82    Wt Readings from Last 3 Encounters:  04/03/24 92 lb (41.7 kg)  01/30/24 95 lb 3.2 oz (43.2 kg)  01/23/24 96 lb 3.2 oz (43.6 kg)     Physical Exam Physical Exam MEASUREMENTS: Weight- 89. GENERAL: Alert, cooperative, well developed, no acute distress HEENT: Normocephalic, normal oropharynx, moist mucous membranes CHEST: Clear to auscultation bilaterally, No wheezes, rhonchi, or crackles CARDIOVASCULAR: Normal heart rate and rhythm, S1 and S2 normal without murmurs ABDOMEN: Soft, non-tender, non-distended, without organomegaly, Normal bowel sounds EXTREMITIES: No cyanosis or edema NEUROLOGICAL: Cranial nerves grossly intact, Moves all extremities without gross motor or sensory deficit   Assessment & Plan:  Recurrent major depressive disorder, in full remission -     CMP14+EGFR -     Sedimentation rate -     Rheumatoid factor -     C-reactive protein -     CYCLIC CITRUL PEPTIDE ANTIBODY, IGG/IGA  Primary hypertension -     CMP14+EGFR -     Sedimentation rate -     Rheumatoid factor -     C-reactive protein -     CYCLIC CITRUL PEPTIDE ANTIBODY,  IGG/IGA  Mixed hyperlipidemia -     CMP14+EGFR -     Sedimentation rate -     Rheumatoid factor -     C-reactive protein -     CYCLIC CITRUL PEPTIDE ANTIBODY, IGG/IGA  Primary osteoarthritis of right elbow -     CMP14+EGFR -     Sedimentation rate -     Rheumatoid factor -     C-reactive protein -     CYCLIC CITRUL PEPTIDE ANTIBODY, IGG/IGA  Arthralgia, unspecified joint -     CMP14+EGFR -     Sedimentation rate -     Rheumatoid factor -     C-reactive protein -     CYCLIC CITRUL PEPTIDE ANTIBODY, IGG/IGA  Other orders -     Venlafaxine  HCl ER; Take 1  capsule (75 mg total) by mouth at bedtime.  Dispense: 30 capsule; Refill: 1 -     Olmesartan  Medoxomil; Take 1 tablet (20 mg total) by mouth daily. for blood pressure  Dispense: 90 tablet; Refill: 3 -     buPROPion HCl ER (XL); Take 1 tablet (150 mg total) by mouth daily.  Dispense: 30 tablet; Refill: 1    Assessment and Plan Assessment & Plan Major depressive disorder with medication adjustment   Chronic major depressive disorder has recently worsened. She is on venlafaxine , which was increased from 75 mg to 150 mg last December. She reports night sweats and confusion, possibly due to the medication. Decrease venlafaxine  to 75 mg and initiate bupropion (Wellbutrin XL) to address depressive symptoms and reduce side effects. Monitor for improvement in depressive symptoms and side effects.  Night sweats   Intermittent night sweats are likely related to venlafaxine . Decrease venlafaxine  to 75 mg and initiate bupropion (Wellbutrin XL) to address depressive symptoms and reduce side effects.  Unintentional weight loss and poor appetite   She has experienced recent unintentional weight loss and poor appetite, possibly related to depression or other underlying conditions. Her weight has decreased by four pounds in the last few months. Appetite issues may be exacerbated by the current mood disorder. Monitor weight and appetite. Evaluate  response to bupropion for potential improvement in mood and appetite.  Confusion and memory impairment   She experiences intermittent confusion and memory impairment, with episodes of disorientation regarding time. No clear etiology is identified, but medication side effects are considered.  Normocytic anemia   Chronic normocytic anemia presents with normal MCV, MCH, and MCHC. Iron infusions administered a month ago increased ferritin levels, but low iron saturation persists. Differential includes inflammation or bone marrow issues. Recent tests show normal kidney function and no significant B12 deficiency. Inflammation is considered a potential cause. Order CMP to assess liver and kidney function. Order rheumatoid factor and ESR to evaluate for inflammatory causes. Order CRP to assess for inflammation. Order cyclic citrulline peptide to evaluate for autoimmune conditions.  Generalized joint pain and myalgia   Generalized joint pain, primarily in knees, is possibly related to osteoarthritis. Differential includes rheumatoid arthritis, but no significant hand involvement. Inflammatory causes are considered. Discontinue atorvastatin  to assess for potential contribution to muscle and joint pain. Order rheumatoid factor and ESR to evaluate for inflammatory causes. Order CRP to assess for inflammation.  Chronic low back pain   Chronic low back pain is likely related to osteoarthritis.       Follow-up: Return in about 1 month (around 05/04/2024).  Butler Der, M.D.

## 2024-04-05 NOTE — Progress Notes (Signed)
-   [employer] obtained and placed in the courier box for pick up.

## 2024-04-08 ENCOUNTER — Telehealth: Payer: Self-pay | Admitting: Family Medicine

## 2024-04-08 ENCOUNTER — Ambulatory Visit: Payer: Self-pay | Admitting: Family Medicine

## 2024-04-08 LAB — CMP14+EGFR
ALT: 11 IU/L (ref 0–32)
AST: 13 IU/L (ref 0–40)
Albumin: 3.6 g/dL — ABNORMAL LOW (ref 3.8–4.8)
Alkaline Phosphatase: 77 IU/L (ref 49–135)
BUN/Creatinine Ratio: 16 (ref 12–28)
BUN: 12 mg/dL (ref 8–27)
Bilirubin Total: <0.2 mg/dL (ref 0.0–1.2)
CO2: 22 mmol/L (ref 20–29)
Calcium: 9.2 mg/dL (ref 8.7–10.3)
Chloride: 99 mmol/L (ref 96–106)
Creatinine, Ser: 0.73 mg/dL (ref 0.57–1.00)
Globulin, Total: 2.3 g/dL (ref 1.5–4.5)
Glucose: 100 mg/dL — ABNORMAL HIGH (ref 70–99)
Potassium: 4.9 mmol/L (ref 3.5–5.2)
Sodium: 137 mmol/L (ref 134–144)
Total Protein: 5.9 g/dL — ABNORMAL LOW (ref 6.0–8.5)
eGFR: 85 mL/min/1.73 (ref >59)

## 2024-04-08 LAB — C-REACTIVE PROTEIN: CRP: 53 mg/L — ABNORMAL HIGH (ref 0–10)

## 2024-04-08 LAB — SEDIMENTATION RATE: Sed Rate: 9 mm/h (ref 0–40)

## 2024-04-08 LAB — RHEUMATOID FACTOR: Rheumatoid fact SerPl-aCnc: 14.9 [IU]/mL — ABNORMAL HIGH (ref <14.0)

## 2024-04-08 LAB — CYCLIC CITRUL PEPTIDE ANTIBODY, IGG/IGA: Cyclic Citrullin Peptide Ab: 8 U (ref 0–19)

## 2024-04-08 NOTE — Telephone Encounter (Signed)
 Pt. Labs are indicative of rheumatoid arthritis. Just as they suspected. I recommend referral to rheumatology. Is there a particular one they would like to see?

## 2024-04-08 NOTE — Telephone Encounter (Signed)
 Pt's grand daughter informed. LS

## 2024-04-09 ENCOUNTER — Other Ambulatory Visit: Payer: Self-pay

## 2024-04-09 ENCOUNTER — Other Ambulatory Visit: Payer: Self-pay | Admitting: Family Medicine

## 2024-04-09 ENCOUNTER — Telehealth: Payer: Self-pay | Admitting: Family Medicine

## 2024-04-09 DIAGNOSIS — M0579 Rheumatoid arthritis with rheumatoid factor of multiple sites without organ or systems involvement: Secondary | ICD-10-CM

## 2024-04-09 NOTE — Telephone Encounter (Signed)
Referral placed, as requested WS 

## 2024-04-09 NOTE — Telephone Encounter (Unsigned)
 Copied from CRM #[id number]. Topic: Referral - Question >> Apr 09, 2024  3:41 PM Everette C wrote: Reason for CRM: The patient's granddaughter has called to share that they're comfortable with the patient being seen anywhere in the [facility] area for Rheumatology. Please contact further if needed

## 2024-04-09 NOTE — Telephone Encounter (Signed)
 Granddaughter of patient called. reinfection if c-diff bacteria (gastro). Has loose, yellow colored bowels, was tested last week and came back positive. Will be prioritizing her infection for now, and will get her referred at a later date. Wanted to make PCP and staff aware.  Phone for [employer]: [phone]

## 2024-04-10 NOTE — Telephone Encounter (Signed)
Noted  -LS

## 2024-04-22 ENCOUNTER — Ambulatory Visit: Payer: Self-pay

## 2024-04-22 ENCOUNTER — Encounter: Payer: Self-pay | Admitting: Family Medicine

## 2024-04-22 ENCOUNTER — Ambulatory Visit (INDEPENDENT_AMBULATORY_CARE_PROVIDER_SITE_OTHER): Admitting: Family Medicine

## 2024-04-22 VITALS — BP 129/75 | HR 75 | Temp 98.0°F | Wt 90.8 lb

## 2024-04-22 DIAGNOSIS — R410 Disorientation, unspecified: Secondary | ICD-10-CM | POA: Diagnosis not present

## 2024-04-22 DIAGNOSIS — F03A0 Unspecified dementia, mild, without behavioral disturbance, psychotic disturbance, mood disturbance, and anxiety: Secondary | ICD-10-CM | POA: Diagnosis not present

## 2024-04-22 DIAGNOSIS — F3342 Major depressive disorder, recurrent, in full remission: Secondary | ICD-10-CM

## 2024-04-22 MED ORDER — DONEPEZIL HCL 5 MG PO TABS: 5.0000 mg | ORAL_TABLET | Freq: Every day | ORAL | 1 refills | Status: DC

## 2024-04-22 MED ORDER — BUPROPION HCL ER (XL) 300 MG PO TB24
300.0000 mg | ORAL_TABLET | Freq: Every day | ORAL | 0 refills | Status: AC
Start: 2024-04-22 — End: ?

## 2024-04-22 MED ORDER — VENLAFAXINE HCL ER 37.5 MG PO CP24: 37.5000 mg | ORAL_CAPSULE | Freq: Every day | ORAL | 0 refills | Status: AC

## 2024-04-22 NOTE — Telephone Encounter (Signed)
 Granddaughter states that they need an afternoon appointment to be able to bring patient in for assessment.    FYI Only or Action Required?: FYI only for provider: appointment scheduled on 04/22/2024 at 4:10pm with patient's PCP Dr Butler Der .  Patient was last seen in primary care on 04/03/2024 by Der Butler, MD.  Called Nurse Triage reporting Neurologic Problem.  Symptoms began granddaughter states about a year ago but getting worse lately.  Interventions attempted: Rest, hydration, or home remedies.  Symptoms are: gradually worsening.  Triage Disposition: See Physician Within 24 Hours  Patient/caregiver understands and will follow disposition?: Yes               Copied from CRM #[id number]. Topic: Clinical - Red Word Triage >> Apr 22, 2024  7:45 AM Montie POUR wrote: Red Word that prompted transfer to Nurse Triage:  She fell 2 times yesterday at [facility]; She did hit her head getting out of the car at [place]. She has gotten last while driving to [facility] and to her daughter's home in the last 2 days >> Apr 22, 2024  7:47 AM Montie POUR wrote: Balance has been off for about 1 years Reason for Disposition  [1] Confusion getting worse AND [2] slow onset (days to weeks)  Answer Assessment - Initial Assessment Questions Granddaughter states patient has had memory issues for over a year Patient has denied dizziness Patient had a fall last Thanksgiving & she had just gotten over treating C-diff Last night patient wanted to go to church, usually dont feel comfortable driving at night but she did--& then the patient felt lost last night and could not figure out how to get home per granddaughter. Patient also could not remember how to get to get to her patient's daughter's home.   Yesterday patient bumped her head yesterday getting out of the car. Patient also tripped twice yesterday---one time falling on her knees and the next time someone caught her before falling  back.  Patient is currently at home, just got up and is going to fix some breakfast so she is alert and oriented at this current time  At the end of August---patient has called this granddaughter and thought it was Friday morning, took her morning medications, and went driving and did not know why she   Granddaughter states she got a referral to [facility] in [place]. Granddaughter is waiting to hear back from them and appointments are booked out for months.  This summer patient started getting mixed up with time and then within the last week she started getting mixed up and lost with places.  Every incident has been when it has been dark outside.  Anti-depressant medications changed recently--started patient on Wellbutrin & lowered the dose of venlafaxine   Granddaughter states that they need an afternoon appointment     1. MAIN CONCERN OR SYMPTOM:  What is your main concern right now? What questions do you have? What's the main symptom you're worried about? (e.g., confusion, memory loss)     Memory confusion 2. ONSET:  When did the symptom start (or worsen)? (minutes, hours, days, weeks)     Year ago but gotten worse since  last summer 3. BETTER-SAME-WORSE: Are you (the patient) getting better, staying the same, or getting worse compared to the day you (they) were diagnosed or most recent hospital discharge?     Getting worse 4. DIAGNOSIS: Was the dementia diagnosed by a doctor? If Yes, ask: When? (  e.g., days, months, years ago)     Not diagnosed but granddaughter would like patient assessed and they are waiting for hear back 5. MEDICINES: Has there been any change in medicines recently? (e.g., narcotics, antihistamines, benzodiazepines, etc.)     Anti-depressant medications changed recently--started patient on Wellbutrin & lowered the dose of venlafaxine  6. OTHER SYMPTOMS: Are there any other symptoms? (e.g., cough,  falling, fever, pain)     Balance issues for a year 7. SUPPORT: What type of support do you (the patient) have? Note: Document living circumstances and support (e.g., family, nursing home).     family  Protocols used: Dementia Symptoms and Questions-A-AH

## 2024-04-22 NOTE — Progress Notes (Signed)
 Subjective:  Patient ID: Karina Clark, female    DOB: 11-Nov-1945  Age: 78 y.o. MRN: [id number]  CC: Fall (Yesterday pt had 2 falls. Most recent was yesterday. Feeling like her legs wont work and feels out of it. ), Memory Loss (Lost  her way home from church which is only 3 minutes from her house. This has happened twice. Referral placed but can't be seen til July. ), brain infection (Infection in sinuses and had surgery. Surgeon mentioned that it could reach brain. Wondering if this could be playing a part?), and Medication Problem (Would like to know if medication was contributing to memory cognitive changes? )   HPI  Discussed the use of AI scribe software for clinical note transcription with the patient, who gave verbal consent to proceed.  History of Present Illness Karina Clark is a 78 year old female with early dementia who presents with memory concerns and recent episodes of confusion.  She describes an incident where she forgot how to get home and had to stop in a parking lot. Additionally, there was an episode at church where she fell and does not recall the event, although witnesses confirmed it happened. She also had difficulty recalling the current season during a recent test. There is a history of a fall at church that she does not remember, although witnesses confirmed it happened.  She has a history of urinary tract infections and was recently treated with doxycycline. There was a concern raised by the emergency room about the safety of this antibiotic for her, but she has no known cirrhosis or other contraindications for its use.  She has been experiencing symptoms of depression, and her current medication includes bupropion. Her sleep pattern is generally good, although she sometimes stays up late reading. She reports that her cat helps her fall asleep. She is currently taking venlafaxine  for sleep and mood.  She enjoys walking for exercise but does not take her  phone or use a cane, which raises safety concerns from her family.          04/22/2024    4:51 PM 04/03/2024    2:38 PM 01/30/2024    2:52 PM  Depression screen PHQ 2/9  Decreased Interest 3 1 2   Down, Depressed, Hopeless 2 2 2   PHQ - 2 Score 5 3 4   Altered sleeping 2 2 2   Tired, decreased energy 3 3 2   Change in appetite 3 3 2   Feeling bad or failure about yourself  2 1 2   Trouble concentrating 2 2 2   Moving slowly or fidgety/restless 2 0 0  Suicidal thoughts 0 0 0  PHQ-9 Score 19 14  14    Difficult doing work/chores Somewhat difficult Very difficult Somewhat difficult     Data saved with a previous flowsheet row definition    History Karina Clark has a past medical history of Anxiety, Cataract, Depression, GERD (gastroesophageal reflux disease), Hyperlipidemia, Hypertension, Osteopenia, and Substance abuse (HCC).   She has a past surgical history that includes Tubal ligation; Eye surgery; and Nasal septum surgery.   Her family history includes Cancer in her brother; Dementia in her father; Hip fracture in her mother; Hyperlipidemia in her mother; Hypertension in her daughter and father; Neuropathy in her brother; Thyroid  disease in her mother.She reports that she quit smoking about 29 years ago. Her smoking use included cigarettes. She started smoking about 55 years ago. She has a 12.9 pack-year smoking history. She has never used smokeless tobacco.  She reports that she does not drink alcohol and does not use drugs.    ROS Review of Systems  Constitutional: Negative.   HENT:  Negative for congestion.   Eyes:  Negative for visual disturbance.  Respiratory:  Negative for shortness of breath.   Cardiovascular:  Negative for chest pain.  Gastrointestinal:  Negative for abdominal pain, constipation, diarrhea, nausea and vomiting.  Genitourinary:  Negative for difficulty urinating.  Musculoskeletal:  Negative for arthralgias and myalgias.  Neurological:  Negative for headaches.   Psychiatric/Behavioral:  Positive for confusion and decreased concentration. Negative for sleep disturbance.     Objective:  BP 129/75   Pulse 75   Temp 98 F (36.7 C)   Wt 90 lb 12.8 oz (41.2 kg)   SpO2 100%   BMI 18.34 kg/m   BP Readings from Last 3 Encounters:  04/22/24 129/75  04/03/24 111/72  01/30/24 120/65    Wt Readings from Last 3 Encounters:  04/22/24 90 lb 12.8 oz (41.2 kg)  04/03/24 92 lb (41.7 kg)  01/30/24 95 lb 3.2 oz (43.2 kg)     Physical Exam Physical Exam GENERAL: Alert, cooperative, well developed, no acute distress HEENT: Normocephalic, normal oropharynx, moist mucous membranes CHEST: Clear to auscultation bilaterally, No wheezes, rhonchi, or crackles CARDIOVASCULAR: Normal heart rate and rhythm, S1 and S2 normal without murmurs ABDOMEN: Soft, non-tender, non-distended, without organomegaly, Normal bowel sounds EXTREMITIES: No cyanosis or edema NEUROLOGICAL: Cranial nerves grossly intact, Moves all extremities without gross motor or sensory deficit   Assessment & Plan:  Mild dementia without behavioral disturbance, psychotic disturbance, mood disturbance, or anxiety, unspecified dementia type (HCC) -     Donepezil HCl; Take 1 tablet (5 mg total) by mouth at bedtime.  Dispense: 30 tablet; Refill: 1  Confusion -     MR BRAIN W WO CONTRAST; Future -     Ambulatory referral to Neurology  Recurrent major depressive disorder, in full remission -     buPROPion HCl ER (XL); Take 1 tablet (300 mg total) by mouth daily.  Dispense: 90 tablet; Refill: 0 -     Venlafaxine  HCl ER; Take 1 capsule (37.5 mg total) by mouth at bedtime.  Dispense: 30 capsule; Refill: 0    Assessment and Plan Assessment & Plan Dementia   She presents with a mild and early form of dementia, possibly vascular, Lewy body, or Alzheimer's, characterized by memory impairment and confusion, including forgetting significant events and difficulty with cognitive tasks. An MRI of the  brain is ordered to assess for lacunar infarcts or other structural changes. Donepezil may help sharpen cognitive function, but she is hesitant to start it. She is encouraged to use a cane for safety during walks and advised against driving at night due to increased risk of confusion and accidents.  Major depressive disorder   Her depression score indicates significant depressive symptoms. Current treatment includes tapering venlafaxine  and using bupropion for depression and as a sleep aid. Bupropion is increased to 300 mg due to the high depression score. Mirtazapine or trazodone  will be considered for sleep if needed. A follow-up is scheduled in three weeks to assess response to medication changes.       Follow-up: Return in about 3 weeks (around 05/13/2024).  Butler Der, M.D.

## 2024-04-22 NOTE — Telephone Encounter (Signed)
Noted  -LS

## 2024-04-26 ENCOUNTER — Telehealth: Payer: Self-pay

## 2024-04-26 NOTE — Telephone Encounter (Signed)
 Copied from CRM #[id number]. Topic: Clinical - Medical Advice >> Apr 26, 2024  2:05 PM Emylou G wrote: Reason for CRM: Ulla Ill granddaughter called.. checking status of referral for MRI Appt?  She thought they would be given a call - she was under the assumption suppose be down by the 24rth?

## 2024-04-30 NOTE — Telephone Encounter (Signed)
 Already spoke with Patient's Grand-Daughter concerning Appt.

## 2024-05-05 ENCOUNTER — Encounter: Payer: Self-pay | Admitting: Family Medicine

## 2024-05-06 ENCOUNTER — Telehealth: Payer: Self-pay

## 2024-05-06 ENCOUNTER — Other Ambulatory Visit

## 2024-05-06 ENCOUNTER — Ambulatory Visit: Payer: Self-pay

## 2024-05-06 ENCOUNTER — Encounter: Payer: Self-pay | Admitting: Family Medicine

## 2024-05-06 ENCOUNTER — Other Ambulatory Visit: Payer: Self-pay | Admitting: Family Medicine

## 2024-05-06 ENCOUNTER — Ambulatory Visit: Payer: Self-pay | Admitting: Family Medicine

## 2024-05-06 DIAGNOSIS — R410 Disorientation, unspecified: Secondary | ICD-10-CM

## 2024-05-06 LAB — URINALYSIS, COMPLETE
Glucose, UA: NEGATIVE
Leukocytes,UA: NEGATIVE
Nitrite, UA: NEGATIVE
Specific Gravity, UA: 1.025 (ref 1.005–1.030)
Urobilinogen, Ur: 0.2 mg/dL (ref 0.2–1.0)
pH, UA: 6 (ref 5.0–7.5)

## 2024-05-06 LAB — MICROSCOPIC EXAMINATION
Renal Epithel, UA: NONE SEEN /HPF
Yeast, UA: NONE SEEN

## 2024-05-06 NOTE — Telephone Encounter (Signed)
Scheduled lab appt for pt

## 2024-05-06 NOTE — Telephone Encounter (Signed)
 Copied from CRM [id number]. Topic: Clinical - Request for Lab/Test Order >> May 06, 2024 11:13 AM Alfonso ORN wrote: Reason for CRM: patient Granddaughter [name] received a mychart message about pt getting urine test  Need clarification about the message recieved >> May 06, 2024 11:31 AM Alfonso ORN wrote: Per CAL was told to schedule an appointment for the labs

## 2024-05-06 NOTE — Telephone Encounter (Signed)
 FYI Only or Action Required?: FYI only for provider: patient to leave urine sample today.  Patient was last seen in primary care on 04/22/2024 by Zollie Lowers, MD.  Called Nurse Triage reporting Altered Mental Status.  Symptoms began 1-2 years ago.  Interventions attempted: Prescription medications: Aricept .  Symptoms are: gradually worsening.  Triage Disposition: See Within 2 Weeks in Office (overriding See HCP Within 4 Hours (Or PCP Triage))  Patient/caregiver understands and will follow disposition?: Yes                               1. LEVEL OF CONSCIOUSNESS: How are they (the patient) acting right now? (e.g., alert-oriented, confused, lethargic, stuporous, comatose)     Worsening confusion and visual hallucinations  2. ONSET: When did the confusion start?  (e.g., minutes, hours, days)     July of 2024, rapid decline since summer of this year 3. PATTERN: Does this come and go, or has it been constant since it started?  Is it present now?     Gradually worsening 6. CAUSE: What do you think is causing the confusion?      Unsure, states patient was seen in office recently (04/22/24) for symptoms, states patient has not been formally diagnosed with dementia, but it is suspected, granddaughter would like to rule out UTI at this time 7. OTHER SYMPTOMS: Are there any other symptoms? (e.g., difficulty breathing, fever, headache, weakness)     Noticed hand tremors last week, cannot recall words at times, lightheaded on and off Denies one-sided weakness and numbness, denies slurred speech, denies vision changes, denies facial drooping, denies difficulty breathing, denies urinary symptoms, denies fever    This RN spoke to patient's granddaughter, [name] (on [place]). Patient has been evaluated in office recently for worsening longstanding confusion. Patient has a follow-up appointment next Monday, 05/13/24. Granddaughter requested to rule out UTI in patient  at this time. Per MyChart message from provider, patient has received approval to drop off or leave a urine sample, without an additional appointment. This RN advised granddaughter that patient should come in today to do so. This RN advised granddaughter to make front desk aware that patient has received approval from provider to leave a urine sample. Granddaughter stated patient will come by today with her daughter to leave a sample. Granddaughter has been advised to call back if symptoms worsen within patient.   Copied from CRM [id number]. Topic: Clinical - Red Word Triage >> May 06, 2024 11:21 AM Alfonso ORN wrote: Red Word that prompted transfer to Nurse Triage: worsening confusion (Pt granddaughter calling to schedule pt. Lab visit and due to questionnaire prompt nurse triage  ----------------------------------------------------------------------- From previous Reason for Contact - Scheduling: Patient/patient representative is calling to schedule an appointment. Refer to attachments for appointment information.  Reason for Disposition  [1] Longstanding confusion (e.g., dementia, stroke) AND [2] getting worse  Protocols used: Confusion - Delirium-A-AH

## 2024-05-06 NOTE — Telephone Encounter (Signed)
Fyi noted.

## 2024-05-07 NOTE — Telephone Encounter (Signed)
New MRI ordered.

## 2024-05-08 LAB — URINE CULTURE

## 2024-05-09 ENCOUNTER — Ambulatory Visit (HOSPITAL_COMMUNITY)

## 2024-05-09 ENCOUNTER — Encounter (HOSPITAL_COMMUNITY): Payer: Self-pay

## 2024-05-10 ENCOUNTER — Encounter: Payer: Self-pay | Admitting: Family Medicine

## 2024-05-13 ENCOUNTER — Ambulatory Visit: Admitting: Family Medicine

## 2024-05-14 ENCOUNTER — Encounter: Payer: Self-pay | Admitting: Family Medicine

## 2024-05-14 ENCOUNTER — Ambulatory Visit: Admitting: Family Medicine

## 2024-05-14 VITALS — BP 161/79 | HR 67 | Temp 98.0°F | Ht 59.0 in | Wt 86.0 lb

## 2024-05-14 DIAGNOSIS — R443 Hallucinations, unspecified: Secondary | ICD-10-CM

## 2024-05-14 DIAGNOSIS — F03B4 Unspecified dementia, moderate, with anxiety: Secondary | ICD-10-CM

## 2024-05-14 DIAGNOSIS — R29898 Other symptoms and signs involving the musculoskeletal system: Secondary | ICD-10-CM

## 2024-05-14 DIAGNOSIS — R2689 Other abnormalities of gait and mobility: Secondary | ICD-10-CM

## 2024-05-14 MED ORDER — CARBIDOPA-LEVODOPA 10-100 MG PO TABS: 1.0000 | ORAL_TABLET | Freq: Three times a day (TID) | ORAL | 5 refills | Status: DC

## 2024-05-14 MED ORDER — DONEPEZIL HCL 10 MG PO TABS: 10.0000 mg | ORAL_TABLET | Freq: Every day | ORAL | 1 refills | Status: DC

## 2024-05-14 NOTE — Progress Notes (Signed)
 Subjective:  Patient ID: Karina Clark, female    DOB: 10-17-1945  Age: 78 y.o. MRN: [id number]  CC: cognitive (There is a MyChart message with some cognitive issues and incidents. Memory not good at all. ), Fall (Pt has had a fall every day the past week. Shuffling feet and gait is off. Not tripping over anything. Causing her back to hurt and scrapes from some falls. No head injuries from any of the falls.  ), Eye Problem (Sees things moving that aren't. No blurred vision and vision overall good besides that. Happens on and off. ), and appetite (Only eating once a day. No appetite except for sweets.)   HPI  Discussed the use of AI scribe software for clinical note transcription with the patient, who gave verbal consent to proceed.  History of Present Illness Karina Clark is a 78 year old female who presents with heaviness in her legs and frequent falls.  She experiences heaviness in her legs, making it difficult to lift them while walking, resulting in a shuffling gait. This sensation affects both legs and has led to frequent falls, occurring almost daily. Recently, she fell out of bed and at a vet's office. No dizziness or passing out during these episodes. She uses a cane but has not transitioned to a walker.  She has red, dry patches on her knuckles, managed with cream. Despite a referral, she has not secured an appointment with rheumatology, and there was a delay in getting an MRI due to a recent iron infusion in September.  She experiences visual disturbances, seeing small things and thinking they are moving, which she wonders might be a side effect of her medication. She is also dealing with the recent loss of her pet cat, Ava Jama, which has been emotionally challenging.  She has been told by another clinician that she is showing early signs of dementia and possibly has poor circulation in her feet. She has not been to church recently but is supported by friends who help her  attend when possible.          05/14/2024   11:38 AM 04/22/2024    4:51 PM 04/03/2024    2:38 PM  Depression screen PHQ 2/9  Decreased Interest 2 3 1   Down, Depressed, Hopeless 3 2 2   PHQ - 2 Score 5 5 3   Altered sleeping 3 2 2   Tired, decreased energy 3 3 3   Change in appetite 3 3 3   Feeling bad or failure about yourself  2 2 1   Trouble concentrating 1 2 2   Moving slowly or fidgety/restless 2 2 0  Suicidal thoughts 0 0 0  PHQ-9 Score 19 19 14    Difficult doing work/chores Very difficult Somewhat difficult Very difficult     Data saved with a previous flowsheet row definition    History [name] has a past medical history of Anxiety, Cataract, Depression, GERD (gastroesophageal reflux disease), Hyperlipidemia, Hypertension, Osteopenia, and Substance abuse (HCC).   She has a past surgical history that includes Tubal ligation; Eye surgery; and Nasal septum surgery.   Her family history includes Cancer in her brother; Dementia in her father; Hip fracture in her mother; Hyperlipidemia in her mother; Hypertension in her daughter and father; Neuropathy in her brother; Thyroid  disease in her mother.She reports that she quit smoking about 29 years ago. Her smoking use included cigarettes. She started smoking about 55 years ago. She has a 12.9 pack-year smoking history. She has never used smokeless  tobacco. She reports that she does not drink alcohol and does not use drugs.    ROS Review of Systems  Constitutional: Negative.   HENT:  Negative for congestion.   Eyes:  Negative for visual disturbance.  Respiratory:  Negative for shortness of breath.   Cardiovascular:  Negative for chest pain.  Gastrointestinal:  Negative for abdominal pain, constipation, diarrhea, nausea and vomiting.  Genitourinary:  Negative for difficulty urinating.  Musculoskeletal:  Positive for arthralgias (knuckles). Negative for myalgias.  Neurological:  Negative for headaches.  Psychiatric/Behavioral:   Positive for confusion (per grand daughter, pt. has been increasingly confused) and hallucinations (ants). Negative for sleep disturbance. The patient is nervous/anxious.     Objective:  BP (!) 161/79   Pulse 67   Temp 98 F (36.7 C)   Ht 4' 11 (1.499 m)   Wt 86 lb (39 kg)   SpO2 100%   BMI 17.37 kg/m   BP Readings from Last 3 Encounters:  05/14/24 (!) 161/79  04/22/24 129/75  04/03/24 111/72    Wt Readings from Last 3 Encounters:  05/14/24 86 lb (39 kg)  04/22/24 90 lb 12.8 oz (41.2 kg)  04/03/24 92 lb (41.7 kg)     Physical Exam Constitutional:      General: She is not in acute distress.    Appearance: She is well-developed.  HENT:     Head: Normocephalic and atraumatic.  Eyes:     Conjunctiva/sclera: Conjunctivae normal.     Pupils: Pupils are equal, round, and reactive to light.  Neck:     Thyroid : No thyromegaly.  Cardiovascular:     Rate and Rhythm: Normal rate and regular rhythm.     Heart sounds: Normal heart sounds. No murmur heard. Pulmonary:     Effort: Pulmonary effort is normal. No respiratory distress.     Breath sounds: Normal breath sounds. No wheezing or rales.  Abdominal:     General: Bowel sounds are normal. There is no distension.     Palpations: Abdomen is soft.     Tenderness: There is no abdominal tenderness.  Musculoskeletal:        General: Normal range of motion.     Cervical back: Normal range of motion and neck supple.  Lymphadenopathy:     Cervical: No cervical adenopathy.  Skin:    General: Skin is warm and dry.  Neurological:     Mental Status: She is alert and oriented to person, place, and time.  Psychiatric:        Behavior: Behavior normal.        Thought Content: Thought content normal.        Judgment: Judgment normal.    Physical Exam GENERAL: Alert, cooperative, well developed, no acute distress HEENT: Normocephalic, normal oropharynx, moist mucous membranes CHEST: Clear to auscultation bilaterally, No wheezes,  rhonchi, or crackles CARDIOVASCULAR: Normal heart rate and rhythm, S1 and S2 normal without murmurs ABDOMEN: Soft, non-tender, non-distended, without organomegaly, Normal bowel sounds EXTREMITIES: No cyanosis or edema NEUROLOGICAL: Cranial nerves grossly intact, Moves all extremities without gross motor or sensory deficit   Assessment & Plan:  Impairment of balance -     For home use only DME Walker platform  Hallucinations  Moderate dementia with anxiety, unspecified dementia type (HCC) -     Donepezil  HCl; Take 1 tablet (10 mg total) by mouth at bedtime.  Dispense: 90 tablet; Refill: 1  Leg heaviness -     Carbidopa -Levodopa ; Take 1 tablet by mouth 3 (three)  times daily. For parkinsonism  Dispense: 90 tablet; Refill: 5    Assessment and Plan Assessment & Plan Gait disturbance and recurrent falls   She experiences heaviness in her legs, resulting in a shuffling gait and daily falls, sometimes due to tripping. There are no reports of dizziness or syncope. The only floor obstacle is a long rug under the couch. Safety concerns in the shower due to potential slipping are noted. Use a walker with platforms for support and rest. Exercise caution in the shower with a chair and mat.  Suspected Parkinson's disease   Leg heaviness and gait disturbance suggest possible Parkinson's disease, with a family history in her husband. A neurology referral is pending. A trial of Sinemet  (levodopa ) has been initiated to assess symptom improvement and rule out Parkinson's as a cause. Continue with the neurology referral for further evaluation.  Mild dementia   She has symptoms of confusion and visual disturbances. Donepezil  is prescribed for memory and confusion. The recent loss of a pet may affect symptom evaluation. Increase the dose of donepezil  and monitor for symptom improvement.  Major depressive disorder, in remission   Depression remains in remission, though the recent loss of a pet may impact  mood. No significant changes in depression symptoms are reported. Continue current management of depression.       Follow-up: Return in about 2 weeks (around 05/28/2024) for dementia, Depression.  Butler Der, M.D.

## 2024-05-18 ENCOUNTER — Other Ambulatory Visit: Payer: Self-pay | Admitting: Family Medicine

## 2024-05-18 DIAGNOSIS — F3342 Major depressive disorder, recurrent, in full remission: Secondary | ICD-10-CM

## 2024-05-21 ENCOUNTER — Other Ambulatory Visit: Payer: Self-pay | Admitting: Family Medicine

## 2024-05-21 DIAGNOSIS — F3342 Major depressive disorder, recurrent, in full remission: Secondary | ICD-10-CM

## 2024-05-21 NOTE — Telephone Encounter (Unsigned)
 Copied from CRM #[id number]. Topic: Clinical - Medication Refill >> May 21, 2024  3:50 PM Joesph B wrote: Medication: venlafaxine  XR (EFFEXOR -XR) 37.5 MG 24 hr capsule  Has the patient contacted their pharmacy? Yes (Agent: If no, request that the patient contact the pharmacy for the refill. If patient does not wish to contact the pharmacy document the reason why and proceed with request.) (Agent: If yes, when and what did the pharmacy advise?)  This is the patient's preferred pharmacy:  [id number] - [place], [place] - [address] [place] [address] Phone: [phone] Fax: [phone]  Is this the correct pharmacy for this prescription? Yes If no, delete pharmacy and type the correct one.   Has the prescription been filled recently? Yes  Is the patient out of the medication? No 2 left.   Has the patient been seen for an appointment in the last year OR does the patient have an upcoming appointment? Yes  Can we respond through MyChart? Yes  Agent: Please be advised that Rx refills may take up to 3 business days. We ask that you follow-up with your pharmacy.

## 2024-05-24 ENCOUNTER — Other Ambulatory Visit: Payer: Self-pay | Admitting: Family Medicine

## 2024-05-24 ENCOUNTER — Other Ambulatory Visit: Payer: Self-pay

## 2024-05-24 DIAGNOSIS — F3342 Major depressive disorder, recurrent, in full remission: Secondary | ICD-10-CM

## 2024-05-24 MED ORDER — VENLAFAXINE HCL ER 37.5 MG PO CP24: 37.5000 mg | ORAL_CAPSULE | Freq: Every day | ORAL | 0 refills | Status: DC

## 2024-05-24 MED ORDER — VENLAFAXINE HCL ER 37.5 MG PO CP24: 37.5000 mg | ORAL_CAPSULE | Freq: Every day | ORAL | 0 refills | Status: AC

## 2024-05-24 NOTE — Telephone Encounter (Signed)
 Pt called and asked if yall could please refill this today, she absolutely needs it. Please call pt and advise.

## 2024-05-27 ENCOUNTER — Ambulatory Visit: Admitting: Family Medicine

## 2024-05-31 ENCOUNTER — Ambulatory Visit (HOSPITAL_COMMUNITY)
Admission: RE | Admit: 2024-05-31 | Discharge: 2024-05-31 | Disposition: A | Discharge status: Home / Self Care | Source: Ambulatory Visit | Attending: Family Medicine | Admitting: Family Medicine

## 2024-05-31 DIAGNOSIS — R410 Disorientation, unspecified: Secondary | ICD-10-CM | POA: Insufficient documentation

## 2024-05-31 DIAGNOSIS — G319 Degenerative disease of nervous system, unspecified: Secondary | ICD-10-CM | POA: Diagnosis not present

## 2024-05-31 MED ORDER — GADOBUTROL 1 MMOL/ML IV SOLN
4.0000 mL | Freq: Once | INTRAVENOUS | Status: AC | PRN
  Administered 2024-05-31: 4 mL via INTRAVENOUS

## 2024-06-18 ENCOUNTER — Ambulatory Visit: Payer: Self-pay

## 2024-06-18 NOTE — Telephone Encounter (Signed)
 Call CAL reported decline ER

## 2024-06-18 NOTE — Telephone Encounter (Signed)
 FYI Only or Action Required?: Action required by provider: decline ER.  Patient was last seen in primary care on 05/14/2024 by Zollie Lowers, MD.  Called Nurse Triage reporting Fall and Head Injury.  Symptoms began few days ago .  Interventions attempted: Nothing.  Symptoms are: gradually worsening.  Triage Disposition: Go to ED Now (Notify PCP)  Patient/caregiver understands and will follow disposition?: No, wishes to speak with PCP             Reason for Disposition  Can't remember what happened (amnesia)  Answer Assessment - Initial Assessment Questions Patient grand daughter Ulla Ill  [facility]) who is not with patient  reports noticed steady decline in patient since July 2024 since took too much benydral for bee stings. With 24 hour period overdosed unknowingly. Since then balance off . Wobbely PCP discussed aging at that time. All last year memory worse. Balance worse. On Aricept  for memory [place] disorientation to time/day ,  am and pm , PCP aware of this.  Sometime in November  drove to church at night 4 mins away, has lived there 40 years got lost. Made PCP aware.    Concerns patient  fdaily since thanksgiving and not reporting all falls not finding out about them until after the fact. Last 2 weeks has mentioned his head, 1 time in bedroom mentioned a knot on head and pain related to that , and fell again a couple of nights ago hit back of head, last night hallunacating a friend from past . This RN advised patient to go to ER, grand daughter did try and encourage the emergency room, and family reporting thinking it dementia related and noting can do. Has neurology appointment end of month. Grand daughter is requesting call back from office.  Grand daughter will try encourage the ER and if willing to go will take her but will call CAL now    1. MECHANISM: How did the injury happen? For falls, ask: What height did you fall from? and What surface did you fall  against?      Multiple falls , most recent a couple nights ago  2. ONSET: When did the injury happen? (e.g., minutes, hours ago)      A couple nights ago  3. NEUROLOGIC SYMPTOMS: Was there any loss of consciousness? Are there any other neurological symptoms?      Has been hallucinating seeing an old  friend     4. MENTAL STATUS: Does the person know who they are, who you are, and where they are?      Has been seeing things that aren't there  5. LOCATION: What part of the head was hit?      Back of head  10. BLOOD THINNERS: Do you take any blood thinners? (e.g., aspirin, clopidogrel / Plavix, coumadin, heparin). Notes: Other strong blood thinners include: Arixtra (fondaparinux), Eliquis (apixaban), Pradaxa (dabigatran), and Xarelto (rivaroxaban).       Reviewed chart no blood thinner 11. OTHER SYMPTOMS: Do you have any other symptoms? (e.g., neck pain, vomiting)       Reported pain with the knot on head , has picture of vien  that swells up and gets  bigger on forehead that is painful  Protocols used: Head Injury-A-AH Copied from CRM #[id number]. Topic: Clinical - Red Word Triage >> Jun 18, 2024  8:28 AM Farrel B wrote: [facility] that prompted transfer to Nurse Triage: pt is hallucinating, speaking to people who are not actually there, grand daughter  Ms. Milissa states pt has been constantly falling and she's not sure if its related to another UTI

## 2024-06-18 NOTE — Telephone Encounter (Signed)
 Had first fall after MRI on 12/26 which showed normal aging. Fall did produce a knot on the back of the head, swelling has gone down. She had a second fall on 06/13/24 where she hit the back of her head again on a cabinet but there was no injury/swelling that resulted. Karina Clark has also noted a large vein on the front of her head that comes and goes that does hurt her when it is present. In the last 2 days pt has mentioned not living at her house for a long time although she has been there for 40 yrs and that a friend that she has not seen in yrs has been helping her out.  Granddaughter Karina Clark is wanting to know if they can get an order to have a CT done w/o having to go to the ED or have UTI ruled out Please advise

## 2024-06-21 NOTE — Telephone Encounter (Signed)
 In reviewing chart before placing call to family, pt was taken to [facility] ED on 06/19/24 were CT of head & neck were done as well as referrals to [facility] & Palliative care were placed.  [place] Hummer, Nena, NP to Me  (Selected Message) 06/20/24  9:40 PM Need to be seen

## 2024-06-24 ENCOUNTER — Encounter: Payer: Self-pay | Admitting: Family

## 2024-06-24 ENCOUNTER — Ambulatory Visit: Admitting: Family

## 2024-06-24 ENCOUNTER — Telehealth: Payer: Self-pay | Admitting: Family Medicine

## 2024-06-24 VITALS — BP 172/76 | HR 68 | Temp 97.0°F

## 2024-06-24 DIAGNOSIS — R413 Other amnesia: Secondary | ICD-10-CM

## 2024-06-24 DIAGNOSIS — W19XXXA Unspecified fall, initial encounter: Secondary | ICD-10-CM

## 2024-06-24 DIAGNOSIS — F03B4 Unspecified dementia, moderate, with anxiety: Secondary | ICD-10-CM | POA: Diagnosis not present

## 2024-06-24 DIAGNOSIS — R531 Weakness: Secondary | ICD-10-CM

## 2024-06-24 DIAGNOSIS — Z09 Encounter for follow-up examination after completed treatment for conditions other than malignant neoplasm: Secondary | ICD-10-CM | POA: Diagnosis not present

## 2024-06-24 DIAGNOSIS — R296 Repeated falls: Secondary | ICD-10-CM

## 2024-06-24 NOTE — Progress Notes (Signed)
 "  Subjective:    Patient ID: Karina Clark, female    DOB: 03/26/1946, 79 y.o.   MRN: [id number]  Chief Complaint  Patient presents with   Hospitalization Follow-up   Fall    Hit head over the weekend, legs feeling heaving and weak.   Hallucinations    Fall   Pt presents to the office today with frequent falls. She fell and went to the ED on 06/20/23.   CT cervical showed, 1.    No acute fracture or malalignment. 2.    Multilevel degenerative changes of the cervical spine as described above.   CT Head, 1. No CT evidence of acute intracranial abnormality. 2. Chronic microvascular ischemic change. 3. Right paranasal sinus disease.   She had a case management referral. She has started OT and PT on 06/20/24.   She fell yesterday 06/23/24 early morning. Daughter has been staying with patient and sleeping and cough. Heard the noise and found her laying in the floor. Unsure what part of her body hit the wall. Pt is confused for several months that has worsen. Daughter states at times doesn't know who she is or will ask the same questions over and over. She has an appointment to establish with Neurologists 07/02/24. She is taking Aricpet 10 mg daily.   Her urine was negative for infection.  WBC, CMP, and TSH was stable.    Review of Systems  All other systems reviewed and are negative.   Social History   Socioeconomic History   Marital status: Widowed    Spouse name: Not on file   Number of children: 1   Years of education: 92   Highest education level: 12th grade  Occupational History   Occupation: Caregiver    Comment: retired  Tobacco Use   Smoking status: Former    Current packs/day: 0.00    Average packs/day: 0.5 packs/day for 25.9 years (12.9 ttl pk-yrs)    Types: Cigarettes    Start date: 03/24/1969    Quit date: 02/05/1995    Years since quitting: 29.4   Smokeless tobacco: Never  Vaping Use   Vaping status: Never Used  Substance and Sexual Activity    Alcohol use: No    Alcohol/week: 0.0 standard drinks of alcohol   Drug use: No   Sexual activity: Not Currently  Other Topics Concern   Not on file  Social History Narrative   Widowed since 1998. She has one adult daughter and 2 grandchildren. She enjoys reading. She lives in a one story home with steps up onto her deck with a handrail.    Her daughter has mental health issues - she stays worried about her and she is helping care for her, going to her house daily   Social Drivers of Health   Tobacco Use: Medium Risk (06/24/2024)   Patient History    Smoking Tobacco Use: Former    Smokeless Tobacco Use: Never    Passive Exposure: Not on file  Financial Resource Strain: Low Risk (03/31/2024)   Overall Financial Resource Strain (CARDIA)    Difficulty of Paying Living Expenses: Not hard at all  Food Insecurity: No Food Insecurity (03/31/2024)   Epic    Worried About Radiation Protection Practitioner of Food in the Last Year: Never true    Ran Out of Food in the Last Year: Never true  Transportation Needs: No Transportation Needs (03/31/2024)   Epic    Lack of Transportation (Medical): No    Lack of Transportation (Non-Medical):  No  Physical Activity: Unknown (03/31/2024)   Exercise Vital Sign    Days of Exercise per Week: Patient declined    Minutes of Exercise per Session: Not on file  Stress: Stress Concern Present (03/31/2024)   [employer] of Occupational Health - Occupational Stress Questionnaire    Feeling of Stress: To some extent  Social Connections: Moderately Isolated (03/31/2024)   Social Connection and Isolation Panel    Frequency of Communication with Friends and Family: More than three times a week    Frequency of Social Gatherings with Friends and Family: Twice a week    Attends Religious Services: More than 4 times per year    Active Member of [employer] or Organizations: No    Attends Banker Meetings: Not on file    Marital Status: Widowed  Depression (PHQ2-9): High  Risk (05/14/2024)   Depression (PHQ2-9)    PHQ-2 Score: 19  Alcohol Screen: Low Risk (12/06/2023)   Alcohol Screen    Last Alcohol Screening Score (AUDIT): 0  Housing: Unknown (03/31/2024)   Epic    Unable to Pay for Housing in the Last Year: No    Number of Times Moved in the Last Year: Not on file    Homeless in the Last Year: No  Utilities: Not At Risk (12/06/2023)   Epic    Threatened with loss of utilities: No  Health Literacy: Adequate Health Literacy (12/06/2023)   B1300 Health Literacy    Frequency of need for help with medical instructions: Never   Family History  Problem Relation Age of Onset   Hyperlipidemia Mother    Thyroid  disease Mother    Hip fracture Mother    Hypertension Father    Dementia Father    Cancer Brother        spine   Neuropathy Brother    Hypertension Daughter    Breast cancer Neg Hx    Colon cancer Neg Hx    Colon polyps Neg Hx         Objective:   Physical Exam Vitals reviewed.  Constitutional:      General: She is not in acute distress.    Appearance: She is well-developed.  HENT:     Head: Normocephalic and atraumatic.  Eyes:     Pupils: Pupils are equal, round, and reactive to light.  Neck:     Thyroid : No thyromegaly.  Cardiovascular:     Rate and Rhythm: Normal rate and regular rhythm.     Heart sounds: Normal heart sounds. No murmur heard. Pulmonary:     Effort: Pulmonary effort is normal. No respiratory distress.     Breath sounds: Normal breath sounds. No wheezing.  Abdominal:     General: Bowel sounds are normal. There is no distension.     Palpations: Abdomen is soft.     Tenderness: There is no abdominal tenderness.  Musculoskeletal:        General: No tenderness. Normal range of motion.     Cervical back: Normal range of motion and neck supple.  Skin:    General: Skin is warm and dry.  Neurological:     Mental Status: She is alert and oriented to person, place, and time.     Cranial Nerves: No cranial nerve  deficit.     Motor: Weakness present.     Gait: Gait abnormal.     Deep Tendon Reflexes: Reflexes are normal and symmetric.     Comments: Generalized weakness   Psychiatric:  Behavior: Behavior normal.        Thought Content: Thought content normal.        Judgment: Judgment normal.       BP (!) 172/76   Pulse 68   Temp (!) 97 F (36.1 C) (Temporal)   SpO2 100%      Assessment & Plan:  ARMENIA SILVERIA comes in today with chief complaint of Hospitalization Follow-up, Fall (Hit head over the weekend, legs feeling heaving and weak.), and Hallucinations   Diagnosis and orders addressed:  1. Frequent falls (Primary) - CMP14+EGFR - CBC with Differential/Platelet  2. Moderate dementia with anxiety, unspecified dementia type (HCC) - CMP14+EGFR - CBC with Differential/Platelet  3. Hospital discharge follow-up - CMP14+EGFR - CBC with Differential/Platelet  4. Generalized weakness    Labs pending Continue PT and OT  Keep follow up with Neurologists  Continue current medications  Follow up with PCP     Bari Learn, FNP   "

## 2024-06-24 NOTE — Patient Instructions (Signed)
 Dementia Caregiver Guide Dementia is a condition that affects the way the brain works. It often affects thinking and memory. A person with dementia may: Forget things. Have trouble talking or responding to your questions. Have trouble paying attention. Have trouble thinking clearly and making good decisions. Get lost or wander away from home or other places. Have big changes in their mood or emotions. They may: Feel very worried, nervous, or depressed. Have angry outbursts. Be suspicious or accuse you of things. Have childlike behavior and language. Taking care of someone with dementia can be a challenge. The tips below can help you care for the person. How to help manage lifestyle changes Dementia usually gets worse slowly over time. In the early stages, people with dementia can stay safe and take care of themselves with some help. In later stages, they need help with daily tasks like getting dressed, grooming, and going to the bathroom. Communicating When the person is talking and seems frustrated, make eye contact and hold the person's hand. Ask questions that can be answered with a yes or no. Use simple words and a calm voice. Only give one direction at a time. Limit choices for the person. Too many choices can be stressful. Avoid correcting the person in a negative way. If the person can't find the right words, gently try to help. Preventing injury  Keep floors clear. Remove rugs, magazine racks, and floor lamps. Keep hallways well lit, especially at night. Put a handrail and nonslip mat in the bathtub or shower. Put childproof locks on cabinets that have dangerous items in them. These items include medicine, alcohol, guns, cleaning products, and sharp tools. For doors to the outside, put locks where the person can't see or reach them. This helps keep the person from going out of the house and getting lost. Be ready for emergencies. Keep a list of emergency phone numbers and  addresses close by. Remove car keys and lock garage doors so the person doesn't try to drive. Have the person wear a bracelet that tracks where they are and shows that they're a person with memory loss. This should be worn at all times for safety. Helping with daily life  Keep the person on track with their daily routine. Try to identify areas where the person may need help. Be supportive, patient, calm, and encouraging. Gently remind the person that adjusting to changes takes time. Help with the tasks that the person has asked for help with. Keep the person involved in daily tasks and decisions as much as you can. Encourage conversation, but try not to get frustrated if the person struggles to find words or doesn't seem to appreciate your help. Other tips Think about any safety risks and take steps to avoid them. Keep things organized: Organize medicines in a pill box for each day of the week. Keep a calendar in a central place. Use it to remind the person of health care visits or other activities. Create a plan to handle any legal or financial matters. Get help from a professional if needed. Help make sure the person: Takes medicines only as told by their health care providers. Eats regular, healthy meals. They should also drink plenty of fluids. Goes to all scheduled health care appointments. Gets regular sleep. Taking care of yourself Being a caregiver for someone with dementia can be hard. You may feel stressed and have many other emotions. It's important to also take care of yourself. Here are some tips: Find out about services that  can provide short-term care for the person. This is called respite care. It can allow you to take a break when you need one. Find healthy ways to deal with stress. Some ways include: Spending time with other people. Exercising. Meditating or doing deep breathing exercises. Take care of your own health by: Getting enough sleep. Eating healthy  foods. Getting regular exercise. Join a support group with others who are caregivers. These groups can help you: Learn other ways to deal with stress. Share experiences with others. Get emotional comfort and support. Learn about caregiving as the disease gets worse. Find resources in your community. Where to find support: Many people and organizations offer support. These include: Support groups for people with dementia. Support groups for caregivers. Counselors or therapists. Home health care services. Adult day care centers. Where to find more information Alzheimer's Association: [employer] Family Caregiver Alliance: caregiver.org Alzheimer's Foundation of [place]: alzfdn.org Contact a health care provider if: The person's health is quickly getting worse. You're no longer able to care for the person. Caring for the person is affecting your physical and emotional health. You're feeling worried, nervous, or depressed about caring for the person. Get help right away if: You feel like the person may hurt themselves or others. The person has talked about taking their own life. These symptoms may be an emergency. Take one of these steps right away: Go to your nearest emergency room. Call 911. Call the National Suicide Prevention Lifeline at (629)578-6266 or 988. Text the Crisis Text Line at 973-762-3480. This information is not intended to replace advice given to you by your health care provider. Make sure you discuss any questions you have with your health care provider. Document Revised: 09/02/2022 Document Reviewed: 09/02/2022 Elsevier Patient Education  2024 [employer].

## 2024-06-24 NOTE — Telephone Encounter (Unsigned)
 Copied from CRM #[id number]. Topic: Clinical - Request for Lab/Test Order >> Jun 24, 2024  4:10 PM Myrick T wrote: Reason for CRM: patients granddaughter called to remind provider to order MRI as patient has had several falls. Please f/u with Cara.

## 2024-06-25 ENCOUNTER — Ambulatory Visit: Payer: Self-pay | Admitting: Family Medicine

## 2024-06-25 ENCOUNTER — Ambulatory Visit: Payer: Self-pay | Admitting: Family

## 2024-06-25 LAB — CBC WITH DIFFERENTIAL/PLATELET
Basophils Absolute: 0 x10E3/uL (ref 0.0–0.2)
Basos: 1 %
EOS (ABSOLUTE): 0 x10E3/uL (ref 0.0–0.4)
Eos: 1 %
Hematocrit: 36.6 % (ref 34.0–46.6)
Hemoglobin: 11.8 g/dL (ref 11.1–15.9)
Immature Grans (Abs): 0 x10E3/uL (ref 0.0–0.1)
Immature Granulocytes: 0 %
Lymphocytes Absolute: 1.2 x10E3/uL (ref 0.7–3.1)
Lymphs: 28 %
MCH: 31.4 pg (ref 26.6–33.0)
MCHC: 32.2 g/dL (ref 31.5–35.7)
MCV: 97 fL (ref 79–97)
Monocytes Absolute: 0.3 x10E3/uL (ref 0.1–0.9)
Monocytes: 8 %
Neutrophils Absolute: 2.6 x10E3/uL (ref 1.4–7.0)
Neutrophils: 62 %
Platelets: 238 x10E3/uL (ref 150–450)
RBC: 3.76 x10E6/uL — ABNORMAL LOW (ref 3.77–5.28)
RDW: 12.4 % (ref 11.7–15.4)
WBC: 4.1 x10E3/uL (ref 3.4–10.8)

## 2024-06-25 LAB — CMP14+EGFR
ALT: 10 IU/L (ref 0–32)
AST: 17 IU/L (ref 0–40)
Albumin: 4.1 g/dL (ref 3.8–4.8)
Alkaline Phosphatase: 61 IU/L (ref 49–135)
BUN/Creatinine Ratio: 25 (ref 12–28)
BUN: 19 mg/dL (ref 8–27)
Bilirubin Total: 0.2 mg/dL (ref 0.0–1.2)
CO2: 23 mmol/L (ref 20–29)
Calcium: 9.7 mg/dL (ref 8.7–10.3)
Chloride: 102 mmol/L (ref 96–106)
Creatinine, Ser: 0.76 mg/dL (ref 0.57–1.00)
Globulin, Total: 2.1 g/dL (ref 1.5–4.5)
Glucose: 84 mg/dL (ref 70–99)
Potassium: 4 mmol/L (ref 3.5–5.2)
Sodium: 141 mmol/L (ref 134–144)
Total Protein: 6.2 g/dL (ref 6.0–8.5)
eGFR: 81 mL/min/1.73

## 2024-06-25 NOTE — Telephone Encounter (Signed)
 Pt had MRI brain on 05/31/24 and a CT head 06/19/24. She has a follow up with  Neurologists. Recommend she keep that appointment. If she is having any new changes in speech, gait, speech she should go to ED.

## 2024-06-25 NOTE — Telephone Encounter (Signed)
 They wanted another scan because of the fall after the hospital that she hit her head.

## 2024-06-27 NOTE — Telephone Encounter (Signed)
 Notified via MyChart. LS

## 2024-06-27 NOTE — Telephone Encounter (Signed)
 CT head ordered

## 2024-06-27 NOTE — Addendum Note (Signed)
 Addended by: LAVELL LYE A on: 06/27/2024 02:00 PM   Modules accepted: Orders

## 2024-06-28 ENCOUNTER — Ambulatory Visit (HOSPITAL_COMMUNITY)

## 2024-07-01 ENCOUNTER — Ambulatory Visit: Payer: Self-pay | Admitting: Family Medicine

## 2024-07-02 ENCOUNTER — Other Ambulatory Visit: Payer: Self-pay | Admitting: Family Medicine

## 2024-07-02 ENCOUNTER — Ambulatory Visit: Payer: Self-pay

## 2024-07-02 DIAGNOSIS — F3342 Major depressive disorder, recurrent, in full remission: Secondary | ICD-10-CM

## 2024-07-02 DIAGNOSIS — F03B4 Unspecified dementia, moderate, with anxiety: Secondary | ICD-10-CM

## 2024-07-02 MED ORDER — DONEPEZIL HCL 5 MG PO TABS: 5.0000 mg | ORAL_TABLET | Freq: Every day | ORAL | 1 refills | Status: AC

## 2024-07-02 NOTE — Telephone Encounter (Signed)
 Decrease the donepezil  to 5 mg (1/2 of a 10 mg tab or a whole 5 mg). Decrease the venlafaxine  to  qod

## 2024-07-02 NOTE — Telephone Encounter (Signed)
 Not yet

## 2024-07-02 NOTE — Telephone Encounter (Signed)
 FYI Only or Action Required?: FYI only for provider: ED advised.  Patient was last seen in primary care on 06/24/2024 by Clark Karina LABOR, FNP.  Called Nurse Triage reporting Hallucinations and Memory Loss.  Symptoms began several weeks ago.  Interventions attempted: Rest, hydration, or home remedies.  Symptoms are: gradually worsening.  Triage Disposition: Go to ED Now (Notify PCP)  Patient/caregiver understands and will follow disposition?: Unsure   Reason for Disposition  [1] Confusion getting worse AND [2] new-onset (hours to 3 days)  Answer Assessment - Initial Assessment Questions Spoke with patient's daughter, Karina Clark, who states that patient has been seen in office this month on 1/8 and dementia has been discussed as possible diagnosis. Since this visit she states patient has declined more, she is experiencing more confusion, hallucinations, anxiety, and falling everyday. She has hit her head multiple times with these falls. She was taken to the ED on 1/14 and CT scan was performed, patient's granddaughter states that ED provider recommended talking to PCP about getting another MRI since patient has fallen again since previous MRI. ED advised based on frequent falls with quick progression of symptoms. States they will be unable to take her today due to hazardous weather, but will call EMS if symptoms worsen or she falls again. Wanting to update PCP on patient's condition, request MRI, and inform him that they are working on applying for Medicaid, getting home health PT/OT in place, and having hospice available if/when needed.   1. MAIN CONCERN OR SYMPTOM:  What is your main concern right now? What questions do you have? What's the main symptom you're worried about? (e.g., confusion, memory loss)     Patient has been having increased confusion and hallucinations  2. ONSET:  When did the symptom start (or worsen)? (minutes, hours, days, weeks)     Since the 1st of the month, but  especially since 06/13/24  3. BETTER-SAME-WORSE: Are you (the patient) getting better, staying the same, or getting worse compared to the day you (they) were diagnosed or most recent hospital discharge?     Worse  4. DIAGNOSIS: Was the dementia diagnosed by a doctor? If Yes, ask: When? (e.g., days, months, years ago)     Possible dementia-not yet confirmed  5. MEDICINES: Has there been any change in medicines recently? (e.g., narcotics, antihistamines, benzodiazepines, etc.)     No  6. OTHER SYMPTOMS: Are there any other symptoms? (e.g., cough, falling, fever, pain)     Can barely walk and falling-has hit head multiple times  7. SUPPORT: What type of support do you (the patient) have? Note: Document living circumstances and support (e.g., family, nursing home).     Daughter and granddaughter  Protocols used: Dementia Symptoms and Questions-A-AH  Pt has had a lot of falls , says pt is forgetful and doesn't remember her daughter, Her granddaughter Karina Clark says the pt needs a MRI not another CT scan. Says it's looking like dementia. Says she is in a panic and crying and not understanding why she can't picture her daughter but knows other people and doesn't know if it due to the falls or possible dementia or her medications- patient is having hallucinations -spoke with Karina Clark 06/23/24 when she had a fall that day.

## 2024-07-03 ENCOUNTER — Ambulatory Visit

## 2024-07-03 DIAGNOSIS — Z556 Problems related to health literacy: Secondary | ICD-10-CM | POA: Diagnosis not present

## 2024-07-03 DIAGNOSIS — I10 Essential (primary) hypertension: Secondary | ICD-10-CM

## 2024-07-03 DIAGNOSIS — Z9181 History of falling: Secondary | ICD-10-CM

## 2024-07-03 DIAGNOSIS — F32A Depression, unspecified: Secondary | ICD-10-CM

## 2024-07-03 DIAGNOSIS — F419 Anxiety disorder, unspecified: Secondary | ICD-10-CM | POA: Diagnosis not present

## 2024-07-03 DIAGNOSIS — Z87891 Personal history of nicotine dependence: Secondary | ICD-10-CM

## 2024-07-03 DIAGNOSIS — G3184 Mild cognitive impairment, so stated: Secondary | ICD-10-CM | POA: Diagnosis not present

## 2024-07-03 DIAGNOSIS — S0990XA Unspecified injury of head, initial encounter: Secondary | ICD-10-CM

## 2024-07-03 DIAGNOSIS — Z7983 Long term (current) use of bisphosphonates: Secondary | ICD-10-CM

## 2024-07-03 NOTE — Telephone Encounter (Signed)
 Patients daughter is requesting that PCP order another MRI for patient. Last one was done on 05/31/2024 but daughter says patient has hit her head with several other falls since having the last MRI done.  Okay to order another MRI?

## 2024-07-04 ENCOUNTER — Ambulatory Visit: Payer: Self-pay

## 2024-07-04 ENCOUNTER — Ambulatory Visit (HOSPITAL_COMMUNITY)
Admission: RE | Admit: 2024-07-04 | Discharge: 2024-07-04 | Disposition: A | Discharge status: Home / Self Care | Source: Ambulatory Visit | Attending: Family Medicine | Admitting: Family Medicine

## 2024-07-04 ENCOUNTER — Ambulatory Visit: Admitting: Family Medicine

## 2024-07-04 ENCOUNTER — Other Ambulatory Visit: Payer: Self-pay | Admitting: Family Medicine

## 2024-07-04 DIAGNOSIS — S098XXA Other specified injuries of head, initial encounter: Secondary | ICD-10-CM | POA: Insufficient documentation

## 2024-07-04 DIAGNOSIS — R2689 Other abnormalities of gait and mobility: Secondary | ICD-10-CM | POA: Insufficient documentation

## 2024-07-04 MED ORDER — GADOBUTROL 1 MMOL/ML IV SOLN
4.0000 mL | Freq: Once | INTRAVENOUS | Status: AC | PRN
  Administered 2024-07-04: 4 mL via INTRAVENOUS

## 2024-07-04 NOTE — Telephone Encounter (Signed)
 I ordered this as a stat

## 2024-07-04 NOTE — Telephone Encounter (Signed)
 FYI Only or Action Required?: FYI only for provider: appointment scheduled on 1/29.  Patient was last seen in primary care on 06/24/2024 by Lavell Bari LABOR, FNP.  Called Nurse Triage reporting Head Injury and Altered Mental Status.  Symptoms began several months ago.  Interventions attempted: Rest, hydration, or home remedies.  Symptoms are: gradually worsening.  Triage Disposition: See HCP Within 4 Hours (Or PCP Triage)  Patient/caregiver understands and will follow disposition?: Yes    Spoke with pts granddaughter Karina Clark (on [place]). Increasing falls since November. Daily falls since December. Not on blood thinners.  Gradually worsening confusion since 2023, worse over the past month. No longer recognizes family members since 1/10. No weakness numbness or slurred speech. Oriented to self only since earlier this month. Pt has not been dx with azheimers or dementia. Has neurologist appt in February.   Last fall where she hit head was 1/18. Seen in office on 1/19 and reviewed CT. Still falling daily but hasn't hit head since 1/18. Advised by PCP via mychart message on 1/27 to decrease the donepezil  to 5 mg (1/2 of a 10 mg tab or a whole 5 mg) and to decrease the venlafaxine  to every other day.  Has not made the med changes yet. Karina Clark would like to order MRI. Advised to be seen today, schedule with PCP today. Advised ED or 911 for worsening symptoms.    Copied from CRM #[id number]. Topic: Clinical - Red Word Triage >> Jul 02, 2024 12:57 PM Karina Clark wrote: Pt has had a lot of falls , says pt is forgetful and doesn't remember her daughter, Her granddaughter Karina Clark says the pt needs a MRI not another CT scan. Says it's looking like dementia. Says she is in a panic and crying and not understanding why she can't picture her daughter but knows other people and doesn't know if it due to the falls or possible dementia or her medications- patient is having hallucinations -spoke with Bari Lavell 06/23/24 when  she had a fall that day. >> Jul 04, 2024  9:30 AM Karina Clark wrote: Patients granddaughter is calling because Pt has had a lot of falls , says pt is forgetful and doesn't remember her daughter, Her granddaughter Karina Clark says the pt needs a MRI not another CT scan. She has called several times about this matter and she is still getting no where. Its concerning because her grandmother knows she is changing and its scary to her. Karina Clark feels she is being ignored now and its important because of the head injuries.  >> Jul 02, 2024  1:00 PM Karina Clark wrote: Pt has had a lot of falls , says pt is forgetful and doesn't remember her daughter, Her granddaughter Karina Clark says the pt needs a MRI not another CT scan. Says it's looking like dementia. Says she is in a panic and crying and not understanding why she can't picture her daughter but knows other people and doesn't know if it due to the falls or possible dementia or her medications- patient is having hallucinations -spoke with Bari Lavell 06/23/24 when she had a fall that day. Reason for Disposition  [1] Longstanding confusion (e.g., dementia, stroke) AND [2] getting worse  Answer Assessment - Initial Assessment Questions 1. LEVEL OF CONSCIOUSNESS: How are they (the patient) acting right now? (e.g., alert-oriented, confused, lethargic, stuporous, comatose)     Oriented to self, fluctuating orientation regarding location and date. Forgetting her family members names.  2. ONSET: When did the confusion start?  (  e.g., minutes, hours, days)     Since 2023, worse over the past month  3. PATTERN: Does this come and go, or has it been constant since it started?  Is it present now?     Constant  4. ALCOHOL or DRUGS: Have they been drinking alcohol or taking any drugs?      No  5. NARCOTIC MEDICINES: Have they been receiving any narcotic medications? (e.g., morphine, Vicodin)     No  6. CAUSE: What do you think is causing the confusion?       Frequent falls over the past 2 months. Possible dementia or alzheimers.  7. OTHER SYMPTOMS: Are there any other symptoms? (e.g., difficulty breathing, fever, headache, weakness)     No  Protocols used: Confusion - Delirium-A-AH

## 2024-07-04 NOTE — Telephone Encounter (Signed)
 Spoke with daughter and she confirmed that she is at the hospital with patient now waiting to have MRI done.

## 2024-07-04 NOTE — Telephone Encounter (Signed)
 Noted.

## 2024-07-08 ENCOUNTER — Ambulatory Visit: Payer: Self-pay | Admitting: Family Medicine

## 2024-07-09 ENCOUNTER — Ambulatory Visit: Admitting: Family Medicine

## 2024-08-01 ENCOUNTER — Ambulatory Visit: Admitting: Family Medicine

## 2024-12-09 ENCOUNTER — Ambulatory Visit: Payer: Self-pay
